# Patient Record
Sex: Male | Born: 1935
Health system: Southern US, Community
[De-identification: ages and names within clinical notes are randomized; demographics above are authoritative.]

## PROBLEM LIST (undated history)

## (undated) DIAGNOSIS — I10 Essential (primary) hypertension: Secondary | ICD-10-CM

## (undated) DIAGNOSIS — N400 Enlarged prostate without lower urinary tract symptoms: Secondary | ICD-10-CM

## (undated) DIAGNOSIS — E7800 Pure hypercholesterolemia, unspecified: Secondary | ICD-10-CM

## (undated) DIAGNOSIS — H269 Unspecified cataract: Secondary | ICD-10-CM

## (undated) HISTORY — PX: ORIF KNEE DISLOCATION: SUR938

## (undated) HISTORY — DX: Unspecified cataract: H26.9

## (undated) HISTORY — PX: EYE SURGERY: SHX253

---

## 1978-06-09 HISTORY — PX: FRACTURE SURGERY: SHX138

## 2005-12-03 ENCOUNTER — Ambulatory Visit (HOSPITAL_COMMUNITY): Admission: RE | Admit: 2005-12-03 | Discharge: 2005-12-03 | Payer: Self-pay | Admitting: Internal Medicine

## 2005-12-03 ENCOUNTER — Ambulatory Visit: Payer: Self-pay | Admitting: Internal Medicine

## 2010-01-24 ENCOUNTER — Ambulatory Visit (HOSPITAL_COMMUNITY): Admission: RE | Admit: 2010-01-24 | Discharge: 2010-01-24 | Payer: Self-pay | Admitting: Ophthalmology

## 2010-08-23 LAB — BASIC METABOLIC PANEL
BUN: 14 mg/dL (ref 6–23)
Calcium: 9.9 mg/dL (ref 8.4–10.5)
Creatinine, Ser: 1.08 mg/dL (ref 0.4–1.5)
GFR calc non Af Amer: >60 mL/min (ref >60)
Glucose, Bld: 185 mg/dL — ABNORMAL HIGH (ref 70–99)

## 2010-10-25 NOTE — Op Note (Signed)
Ethan Davis, Ethan Davis                 ACCOUNT NO.:  [id number]   MEDICAL RECORD NO.:  [id number]          PATIENT TYPE:  AMB   LOCATION:  DAY                           FACILITY:  [facility]   PHYSICIAN:  R. Roetta Sessions, M.D. DATE OF BIRTH:  [birth year]   DATE OF PROCEDURE:  12/03/2005  DATE OF DISCHARGE:                                 OPERATIVE REPORT   PROCEDURE PERFORMED:  Screening colonoscopy.   INDICATIONS FOR PROCEDURE:  The patient is a 77 year old African-American  male sent over courtesy of Dr. Evelene Croon in [place], [place] for  colorectal cancer screening.  Mr. Veith has no lower GI tract symptoms.  He  has never had his lower GI tract evaluated and there is no family history of  colorectal neoplasia.  Colonoscopy is now being done as a screening  maneuver.  This approach has been discussed with the patient at length.  Potential risks, benefits and alternatives have been reviewed, questions  have been answered, he is agreeable.  Please see documentation in the  medical records.   PROCEDURE NOTE:  Oxygen saturations, blood pressure, pulse and respirations  were monitored throughout the entire procedure.   CONSCIOUS SEDATION:  Versed 2 mg IV, Demerol 50 mg IV.   INSTRUMENT USED:  Olympus video chip system.   FINDINGS:  Digital exam revealed no abnormalities.   ENDOSCOPIC FINDINGS:  Prep was adequate.   Rectum:  Examination of the rectal mucosa including retroflex view of the  anal verge revealed no abnormalities aside from some internal hemorrhoids.  Colon:  Colonic mucosa was surveyed from the rectosigmoid junction to the  left, transverse and right colon to the area of the appendiceal orifice and  ileocecal valve and cecum.  These structures were well seen and photographed  for the record.  From this level, the scope was slowly withdrawn.  All  previously mentioned mucosal surfaces were again seen.  The colon was  elongated and tortuous; however, the colonic  mucosa otherwise appeared  normal.  The patient tolerated the procedure well, was reacted in endoscopy.   IMPRESSION:  Minimal internal hemorrhoids, otherwise normal rectum.  Elongated, tortuous but otherwise normal-appearing colon.   RECOMMENDATIONS:  1.  Would not rule out one more colonoscopy in 10 years or longer since      health otherwise is holding up.  2.  He is to follow up with Dr. Lacie Scotts down at [place], [facility] as      scheduled.   I would like to thank Dr. Lacie Scotts for letting us see this nice gentleman  today.      Jonathon Bellows, M.D.  Electronically Signed     RMR/MEDQ  D:  12/03/2005  T:  12/03/2005  Job:  [id number]   cc:   Evelene Croon  Fax: [phone]

## 2011-03-05 ENCOUNTER — Encounter (HOSPITAL_COMMUNITY): Payer: Self-pay

## 2011-03-05 ENCOUNTER — Encounter (HOSPITAL_COMMUNITY)
Admission: RE | Admit: 2011-03-05 | Discharge: 2011-03-05 | Disposition: A | Discharge status: Home / Self Care | Payer: Medicare Other | Source: Ambulatory Visit | Attending: Ophthalmology | Admitting: Ophthalmology

## 2011-03-05 ENCOUNTER — Other Ambulatory Visit: Payer: Self-pay

## 2011-03-05 HISTORY — DX: Benign prostatic hyperplasia without lower urinary tract symptoms: N40.0

## 2011-03-05 HISTORY — DX: Pure hypercholesterolemia, unspecified: E78.00

## 2011-03-05 HISTORY — DX: Essential (primary) hypertension: I10

## 2011-03-05 LAB — BASIC METABOLIC PANEL
CO2: 30 mEq/L (ref 19–32)
Calcium: 9.6 mg/dL (ref 8.4–10.5)
Creatinine, Ser: 0.97 mg/dL (ref 0.50–1.35)
GFR calc Af Amer: >60 mL/min (ref >60)

## 2011-03-05 LAB — CBC
MCH: 31.3 pg (ref 26.0–34.0)
MCHC: 33.1 g/dL (ref 30.0–36.0)
MCV: 94.6 fL (ref 78.0–100.0)
Platelets: 135 10*3/uL — ABNORMAL LOW (ref 150–400)
RDW: 13.5 % (ref 11.5–15.5)

## 2011-03-05 NOTE — Patient Instructions (Addendum)
20 Ethan Davis  03/05/2011   Your procedure is scheduled on:  03/13/2011  Report to [facility] at  730  AM.  Call this number if you have problems the morning of surgery: [phone]   Remember:   Do not eat food:After Midnight.  Do not drink clear liquids: After Midnight.  Take these medicines the morning of surgery with A SIP OF WATER: none   Do not wear jewelry, make-up or nail polish.  Do not wear lotions, powders, or perfumes. You may wear deodorant.  Do not shave 48 hours prior to surgery.  Do not bring valuables to the hospital.  Contacts, dentures or bridgework may not be worn into surgery.  Leave suitcase in the car. After surgery it may be brought to your room.  For patients admitted to the hospital, checkout time is 11:00 AM the day of discharge.   Patients discharged the day of surgery will not be allowed to drive home.  Name and phone number of your driver:  family  Special Instructions: N/A   Please read over the following fact sheets that you were given: Pain Booklet, Surgical Site Infection Prevention, Anesthesia Post-op Instructions and Care and Recovery After Surgery PATIENT INSTRUCTIONS POST-ANESTHESIA  IMMEDIATELY FOLLOWING SURGERY:  Do not drive or operate machinery for the first twenty four hours after surgery.  Do not make any important decisions for twenty four hours after surgery or while taking narcotic pain medications or sedatives.  If you develop intractable nausea and vomiting or a severe headache please notify your doctor immediately.  FOLLOW-UP:  Please make an appointment with your surgeon as instructed. You do not need to follow up with anesthesia unless specifically instructed to do so.  WOUND CARE INSTRUCTIONS (if applicable):  Keep a dry clean dressing on the anesthesia/puncture wound site if there is drainage.  Once the wound has quit draining you may leave it open to air.  Generally you should leave the bandage intact for twenty four hours unless  there is drainage.  If the epidural site drains for more than 36-48 hours please call the anesthesia department.  QUESTIONS?:  Please feel free to call your physician or the hospital operator if you have any questions, and they will be happy to assist you.     [facility] Anesthesia Department [address] [place] [phone]

## 2011-03-13 ENCOUNTER — Encounter (HOSPITAL_COMMUNITY)
Admission: RE | Disposition: A | Discharge status: Home / Self Care | Payer: Self-pay | Source: Ambulatory Visit | Attending: Ophthalmology

## 2011-03-13 ENCOUNTER — Ambulatory Visit (HOSPITAL_COMMUNITY): Payer: Medicare Other | Admitting: Anesthesiology

## 2011-03-13 ENCOUNTER — Encounter (HOSPITAL_COMMUNITY): Payer: Self-pay | Admitting: Ophthalmology

## 2011-03-13 ENCOUNTER — Ambulatory Visit (HOSPITAL_COMMUNITY)
Admission: RE | Admit: 2011-03-13 | Discharge: 2011-03-13 | Disposition: A | Discharge status: Home / Self Care | Payer: Medicare Other | Source: Ambulatory Visit | Attending: Ophthalmology | Admitting: Ophthalmology

## 2011-03-13 ENCOUNTER — Encounter (HOSPITAL_COMMUNITY): Payer: Self-pay | Admitting: Anesthesiology

## 2011-03-13 DIAGNOSIS — Z79899 Other long term (current) drug therapy: Secondary | ICD-10-CM | POA: Insufficient documentation

## 2011-03-13 DIAGNOSIS — Z01810 Encounter for preprocedural cardiovascular examination: Secondary | ICD-10-CM | POA: Insufficient documentation

## 2011-03-13 DIAGNOSIS — Z01812 Encounter for preprocedural laboratory examination: Secondary | ICD-10-CM | POA: Insufficient documentation

## 2011-03-13 DIAGNOSIS — I10 Essential (primary) hypertension: Secondary | ICD-10-CM | POA: Insufficient documentation

## 2011-03-13 DIAGNOSIS — E7800 Pure hypercholesterolemia, unspecified: Secondary | ICD-10-CM | POA: Insufficient documentation

## 2011-03-13 DIAGNOSIS — H2589 Other age-related cataract: Secondary | ICD-10-CM | POA: Insufficient documentation

## 2011-03-13 DIAGNOSIS — E119 Type 2 diabetes mellitus without complications: Secondary | ICD-10-CM | POA: Insufficient documentation

## 2011-03-13 HISTORY — PX: CATARACT EXTRACTION W/PHACO: SHX586

## 2011-03-13 SURGERY — PHACOEMULSIFICATION, CATARACT, WITH IOL INSERTION
Anesthesia: Monitor Anesthesia Care | Site: Eye | Laterality: Right | Wound class: Clean

## 2011-03-13 MED ORDER — BSS IO SOLN
INTRAOCULAR | Status: DC | PRN
  Administered 2011-03-13: 15 mL via OPHTHALMIC

## 2011-03-13 MED ORDER — EPINEPHRINE HCL 1 MG/ML IJ SOLN
INTRAMUSCULAR | Status: AC
  Filled 2011-03-13: qty 1

## 2011-03-13 MED ORDER — PHENYLEPHRINE HCL 2.5 % OP SOLN
OPHTHALMIC | Status: AC
  Administered 2011-03-13: 1 [drp] via OPHTHALMIC
  Filled 2011-03-13: qty 2

## 2011-03-13 MED ORDER — CYCLOPENTOLATE-PHENYLEPHRINE 0.2-1 % OP SOLN
1.0000 [drp] | OPHTHALMIC | Status: AC
  Administered 2011-03-13 (×3): 1 [drp] via OPHTHALMIC

## 2011-03-13 MED ORDER — PROVISC 10 MG/ML IO SOLN
INTRAOCULAR | Status: DC | PRN
  Administered 2011-03-13: .85 mL via OPHTHALMIC

## 2011-03-13 MED ORDER — LIDOCAINE HCL 3.5 % OP GEL
1.0000 "application " | Freq: Once | OPHTHALMIC | Status: AC
  Administered 2011-03-13: 1 via OPHTHALMIC

## 2011-03-13 MED ORDER — LACTATED RINGERS IV SOLN
INTRAVENOUS | Status: DC
  Administered 2011-03-13: 1000 mL via INTRAVENOUS

## 2011-03-13 MED ORDER — LIDOCAINE HCL (PF) 1 % IJ SOLN
INTRAMUSCULAR | Status: DC
Start: 2011-03-13 — End: 2011-03-13
  Filled 2011-03-13: qty 2

## 2011-03-13 MED ORDER — TETRACAINE HCL 0.5 % OP SOLN
OPHTHALMIC | Status: AC
  Administered 2011-03-13: 1 [drp] via OPHTHALMIC
  Filled 2011-03-13: qty 2

## 2011-03-13 MED ORDER — TETRACAINE HCL 0.5 % OP SOLN
1.0000 [drp] | OPHTHALMIC | Status: AC
  Administered 2011-03-13 (×3): 1 [drp] via OPHTHALMIC

## 2011-03-13 MED ORDER — LIDOCAINE 3.5 % OP GEL OPTIME - NO CHARGE
OPHTHALMIC | Status: DC | PRN
  Administered 2011-03-13: 1 [drp] via OPHTHALMIC

## 2011-03-13 MED ORDER — LIDOCAINE HCL (PF) 1 % IJ SOLN
INTRAOCULAR | Status: DC | PRN
  Administered 2011-03-13: 09:00:00 via OPHTHALMIC

## 2011-03-13 MED ORDER — LIDOCAINE HCL 3.5 % OP GEL
OPHTHALMIC | Status: AC
  Administered 2011-03-13: 1 via OPHTHALMIC
  Filled 2011-03-13: qty 5

## 2011-03-13 MED ORDER — POVIDONE-IODINE 5 % OP SOLN
OPHTHALMIC | Status: DC | PRN
  Administered 2011-03-13: 1 via OPHTHALMIC

## 2011-03-13 MED ORDER — PHENYLEPHRINE HCL 2.5 % OP SOLN
1.0000 [drp] | OPHTHALMIC | Status: AC
  Administered 2011-03-13 (×3): 1 [drp] via OPHTHALMIC

## 2011-03-13 MED ORDER — NEOMYCIN-POLYMYXIN-DEXAMETH 3.5-10000-0.1 OP OINT
TOPICAL_OINTMENT | OPHTHALMIC | Status: DC
Start: 2011-03-13 — End: 2011-03-13
  Filled 2011-03-13: qty 3.5

## 2011-03-13 MED ORDER — MIDAZOLAM HCL 2 MG/2ML IJ SOLN
INTRAMUSCULAR | Status: AC
  Administered 2011-03-13: 2 mg via INTRAVENOUS
  Filled 2011-03-13: qty 2

## 2011-03-13 MED ORDER — CYCLOPENTOLATE-PHENYLEPHRINE 0.2-1 % OP SOLN
OPHTHALMIC | Status: AC
  Administered 2011-03-13: 1 [drp] via OPHTHALMIC
  Filled 2011-03-13: qty 2

## 2011-03-13 MED ORDER — NEOMYCIN-POLYMYXIN-DEXAMETH 0.1 % OP OINT
TOPICAL_OINTMENT | OPHTHALMIC | Status: DC | PRN
  Administered 2011-03-13: 1 via OPHTHALMIC

## 2011-03-13 MED ORDER — EPINEPHRINE HCL 1 MG/ML IJ SOLN
INTRAOCULAR | Status: DC | PRN
  Administered 2011-03-13: 09:00:00

## 2011-03-13 MED ORDER — MIDAZOLAM HCL 2 MG/2ML IJ SOLN
1.0000 mg | INTRAMUSCULAR | Status: DC | PRN
  Administered 2011-03-13: 2 mg via INTRAVENOUS

## 2011-03-13 SURGICAL SUPPLY — 33 items
CAPSULAR TENSION RING-AMO (OPHTHALMIC RELATED) IMPLANT
CLOTH BEACON ORANGE TIMEOUT ST (SAFETY) ×1 IMPLANT
DUOVISC SYSTEM (INTRAOCULAR LENS)
EYE SHIELD UNIVERSAL CLEAR (GAUZE/BANDAGES/DRESSINGS) ×1 IMPLANT
GLOVE BIO SURGEON STRL SZ 6.5 (GLOVE) IMPLANT
GLOVE BIOGEL PI IND STRL 6.5 (GLOVE) IMPLANT
GLOVE BIOGEL PI IND STRL 7.0 (GLOVE) IMPLANT
GLOVE BIOGEL PI IND STRL 7.5 (GLOVE) IMPLANT
GLOVE BIOGEL PI INDICATOR 6.5 (GLOVE) ×1
GLOVE BIOGEL PI INDICATOR 7.0 (GLOVE)
GLOVE BIOGEL PI INDICATOR 7.5 (GLOVE)
GLOVE ECLIPSE 6.5 STRL STRAW (GLOVE) IMPLANT
GLOVE ECLIPSE 7.0 STRL STRAW (GLOVE) IMPLANT
GLOVE ECLIPSE 7.5 STRL STRAW (GLOVE) IMPLANT
GLOVE EXAM NITRILE LRG STRL (GLOVE) IMPLANT
GLOVE EXAM NITRILE MD LF STRL (GLOVE) ×1 IMPLANT
GLOVE SKINSENSE NS SZ6.5 (GLOVE)
GLOVE SKINSENSE NS SZ7.0 (GLOVE)
GLOVE SKINSENSE STRL SZ6.5 (GLOVE) IMPLANT
GLOVE SKINSENSE STRL SZ7.0 (GLOVE) IMPLANT
KIT VITRECTOMY (OPHTHALMIC RELATED) IMPLANT
PAD ARMBOARD 7.5X6 YLW CONV (MISCELLANEOUS) ×2 IMPLANT
PROC W NO LENS (INTRAOCULAR LENS)
PROC W SPEC LENS (INTRAOCULAR LENS)
PROCESS W NO LENS (INTRAOCULAR LENS) IMPLANT
PROCESS W SPEC LENS (INTRAOCULAR LENS) IMPLANT
RING MALYGIN (MISCELLANEOUS) IMPLANT
SIGHTPATH CAT PROC W REG LENS (Ophthalmic Related) ×2 IMPLANT
SYR TB 1ML LL NO SAFETY (SYRINGE) ×1 IMPLANT
SYSTEM DUOVISC (INTRAOCULAR LENS) IMPLANT
TAPE CLOTH 1X10 TAN NS (GAUZE/BANDAGES/DRESSINGS) ×1 IMPLANT
VISCOELASTIC ADDITIONAL (OPHTHALMIC RELATED) IMPLANT
WATER STERILE IRR 250ML POUR (IV SOLUTION) ×1 IMPLANT

## 2011-03-13 NOTE — Transfer of Care (Signed)
Immediate Anesthesia Transfer of Care Note  Patient: Ethan Davis  Procedure(s) Performed:  CATARACT EXTRACTION PHACO AND INTRAOCULAR LENS PLACEMENT (IOC) - CDE: 14.58  Patient Location: short stay Anesthesia Type: MAC  Level of Consciousness: awake, alert  and oriented  Airway & Oxygen Therapy: Patient Spontanous Breathing  Post-op Assessment: Report given to PACU RN  Post vital signs: Reviewed and stable  Complications: No apparent anesthesia complications

## 2011-03-13 NOTE — Brief Op Note (Signed)
Pre-Op Dx: Cataract OD Post-Op Dx: Cataract OD Surgeon: Lasharon Dunivan Anesthesia: Topical with MAC Implant: Lenstec, Model Softec HD Blood Loss: None Specimen: None Complications: None 

## 2011-03-13 NOTE — Op Note (Signed)
NAMEAUDY, DAUPHINE NO.:  [id number]  MEDICAL RECORD NO.:  [id number]  LOCATION:  [facility]                          FACILITY:  [facility]  PHYSICIAN:  Susanne Greenhouse, MD       DATE OF BIRTH:  26-Apr-1927  DATE OF PROCEDURE:  03/13/2011 DATE OF DISCHARGE:  03/13/2011                              OPERATIVE REPORT   PREOPERATIVE DIAGNOSIS:  Combined cataract, right eye, diagnosis code 366.19  POSTOPERATIVE DIAGNOSIS:  Combined cataract, right eye, diagnosis code 366.19.  PROCEDURE PERFORMED:  Phacoemulsification with intraocular lens implantation, left eye.  SURGEON:  Susanne Greenhouse, MD  DESCRIPTION OF OPERATION:  In the preoperative holding area, dilating drops and viscous lidocaine were placed into the left eye.  The patient was then brought to the operating room where he was prepped and draped. Beginning with a 75-blade, a paracentesis port was made at the surgeon's 2 o'clock position.  The anterior chamber was then filled with a 1% nonpreserved lidocaine solution.  This was followed by filling the anterior chamber with Provisc.  The 2.4-mm keratome blade was then used to make a clear corneal incision at the temporal limbus.  A bent cystotome needle and Utrata forceps were used to create a continuous tear capsulotomy.  Hydrodissection was performed with balanced salt solution on a fine cannula.  The lens nucleus was then removed using phacoemulsification in a quadrant cracking technique.  Residual cortex was removed with irrigation and aspiration.  The capsular bag and anterior chamber were then refilled with Provisc and a posterior chamber intraocular lens was placed into the capsular bag without difficulty using a lens injecting system.  The Provisc was removed from the capsular bag and anterior chamber with irrigation and aspiration. Stromal hydration of the main incision and paracentesis ports was performed with balanced salt solution on a fine cannula.  The  wounds were tested for leak, which were negative.  The patient tolerated the procedure well.  There were no operative complications and he was returned to the recovery area in satisfactory condition.  No surgical specimens. Prosthetic device used is a Lenstec posterior chamber lens, model Softec HD, power of 18.5, serial number is [id number].          ______________________________ Susanne Greenhouse, MD     KEH/MEDQ  D:  03/13/2011  T:  03/13/2011  Job:  [id number]

## 2011-03-13 NOTE — Anesthesia Preprocedure Evaluation (Addendum)
Anesthesia Evaluation   Patient awake  General Assessment Comment  Reviewed: Allergy & Precautions, H&P , NPO status , Patient's Chart, lab work & pertinent test results  History of Anesthesia Complications Negative for: history of anesthetic complications  Airway Mallampati: I  Neck ROM: Full    Dental  (+) Missing, Chipped and Poor Dentition   Pulmonary    Pulmonary exam normal       Cardiovascular hypertension, Pt. on medications Regular Normal    Neuro/Psych    GI/Hepatic   Endo/Other  Diabetes mellitus-, Well Controlled, Type 2, Oral Hypoglycemic Agents  Renal/GU    BPH    Musculoskeletal   Abdominal   Peds  Hematology   Anesthesia Other Findings   Reproductive/Obstetrics                          Anesthesia Physical Anesthesia Plan  ASA: III  Anesthesia Plan: MAC   Post-op Pain Management:    Induction:   Airway Management Planned: Nasal Cannula  Additional Equipment:   Intra-op Plan:   Post-operative Plan:   Informed Consent: I have reviewed the patients History and Physical, chart, labs and discussed the procedure including the risks, benefits and alternatives for the proposed anesthesia with the patient or authorized representative who has indicated his/her understanding and acceptance.     Plan Discussed with:   Anesthesia Plan Comments:         Anesthesia Quick Evaluation

## 2011-03-13 NOTE — Anesthesia Postprocedure Evaluation (Signed)
  Anesthesia Post-op Note  Patient: Ethan Davis  Procedure(s) Performed:  CATARACT EXTRACTION PHACO AND INTRAOCULAR LENS PLACEMENT (IOC) - CDE: 14.58  Patient Location: PACU and Short Stay  Anesthesia Type: MAC  Level of Consciousness: awake, alert  and oriented  Airway and Oxygen Therapy: Patient Spontanous Breathing  Post-op Pain: none  Post-op Assessment: Post-op Vital signs reviewed, Patient's Cardiovascular Status Stable, Respiratory Function Stable and No signs of Nausea or vomiting  Post-op Vital Signs: Reviewed and stable  Complications: No apparent anesthesia complications

## 2011-03-13 NOTE — H&P (Signed)
I have reviewed the H&P, the patient was re-examined, and I have identified no interval changes in medical condition and plan of care since the history and physical of record  

## 2011-03-19 ENCOUNTER — Encounter (HOSPITAL_COMMUNITY): Payer: Self-pay | Admitting: Ophthalmology

## 2011-06-12 DIAGNOSIS — E7800 Pure hypercholesterolemia, unspecified: Secondary | ICD-10-CM | POA: Diagnosis not present

## 2011-06-12 DIAGNOSIS — R5381 Other malaise: Secondary | ICD-10-CM | POA: Diagnosis not present

## 2011-06-12 DIAGNOSIS — E119 Type 2 diabetes mellitus without complications: Secondary | ICD-10-CM | POA: Diagnosis not present

## 2011-06-12 DIAGNOSIS — E559 Vitamin D deficiency, unspecified: Secondary | ICD-10-CM | POA: Diagnosis not present

## 2011-06-12 DIAGNOSIS — I10 Essential (primary) hypertension: Secondary | ICD-10-CM | POA: Diagnosis not present

## 2011-06-12 DIAGNOSIS — N320 Bladder-neck obstruction: Secondary | ICD-10-CM | POA: Diagnosis not present

## 2011-09-12 DIAGNOSIS — E7800 Pure hypercholesterolemia, unspecified: Secondary | ICD-10-CM | POA: Diagnosis not present

## 2011-09-12 DIAGNOSIS — I10 Essential (primary) hypertension: Secondary | ICD-10-CM | POA: Diagnosis not present

## 2011-09-12 DIAGNOSIS — R5381 Other malaise: Secondary | ICD-10-CM | POA: Diagnosis not present

## 2011-09-12 DIAGNOSIS — M129 Arthropathy, unspecified: Secondary | ICD-10-CM | POA: Diagnosis not present

## 2011-09-12 DIAGNOSIS — E119 Type 2 diabetes mellitus without complications: Secondary | ICD-10-CM | POA: Diagnosis not present

## 2011-12-31 DIAGNOSIS — R5381 Other malaise: Secondary | ICD-10-CM | POA: Diagnosis not present

## 2011-12-31 DIAGNOSIS — M129 Arthropathy, unspecified: Secondary | ICD-10-CM | POA: Diagnosis not present

## 2011-12-31 DIAGNOSIS — R5383 Other fatigue: Secondary | ICD-10-CM | POA: Diagnosis not present

## 2011-12-31 DIAGNOSIS — E119 Type 2 diabetes mellitus without complications: Secondary | ICD-10-CM | POA: Diagnosis not present

## 2011-12-31 DIAGNOSIS — E7800 Pure hypercholesterolemia, unspecified: Secondary | ICD-10-CM | POA: Diagnosis not present

## 2011-12-31 DIAGNOSIS — I10 Essential (primary) hypertension: Secondary | ICD-10-CM | POA: Diagnosis not present

## 2011-12-31 DIAGNOSIS — N329 Bladder disorder, unspecified: Secondary | ICD-10-CM | POA: Diagnosis not present

## 2011-12-31 DIAGNOSIS — N320 Bladder-neck obstruction: Secondary | ICD-10-CM | POA: Diagnosis not present

## 2012-06-08 DIAGNOSIS — E119 Type 2 diabetes mellitus without complications: Secondary | ICD-10-CM | POA: Diagnosis not present

## 2012-06-08 DIAGNOSIS — R5381 Other malaise: Secondary | ICD-10-CM | POA: Diagnosis not present

## 2012-06-08 DIAGNOSIS — I10 Essential (primary) hypertension: Secondary | ICD-10-CM | POA: Diagnosis not present

## 2012-06-08 DIAGNOSIS — E7800 Pure hypercholesterolemia, unspecified: Secondary | ICD-10-CM | POA: Diagnosis not present

## 2012-06-08 DIAGNOSIS — E559 Vitamin D deficiency, unspecified: Secondary | ICD-10-CM | POA: Diagnosis not present

## 2012-06-08 DIAGNOSIS — N320 Bladder-neck obstruction: Secondary | ICD-10-CM | POA: Diagnosis not present

## 2012-06-08 DIAGNOSIS — R5383 Other fatigue: Secondary | ICD-10-CM | POA: Diagnosis not present

## 2012-06-22 DIAGNOSIS — R5383 Other fatigue: Secondary | ICD-10-CM | POA: Diagnosis not present

## 2012-11-14 ENCOUNTER — Encounter (HOSPITAL_COMMUNITY): Payer: Self-pay

## 2012-11-14 ENCOUNTER — Emergency Department (INDEPENDENT_AMBULATORY_CARE_PROVIDER_SITE_OTHER)
Admission: EM | Admit: 2012-11-14 | Discharge: 2012-11-14 | Disposition: A | Discharge status: Home / Self Care | Payer: Medicare Other | Source: Home / Self Care | Attending: Family Medicine | Admitting: Family Medicine

## 2012-11-14 DIAGNOSIS — L089 Local infection of the skin and subcutaneous tissue, unspecified: Secondary | ICD-10-CM

## 2012-11-14 DIAGNOSIS — L723 Sebaceous cyst: Secondary | ICD-10-CM | POA: Diagnosis not present

## 2012-11-14 MED ORDER — DOXYCYCLINE HYCLATE 100 MG PO CAPS: 100.0000 mg | ORAL_CAPSULE | Freq: Two times a day (BID) | ORAL | Status: DC

## 2012-11-14 MED ORDER — ACETAMINOPHEN 650 MG PO TABS: 1.0000 | ORAL_TABLET | Freq: Three times a day (TID) | ORAL | Status: AC | PRN

## 2012-11-14 NOTE — ED Notes (Signed)
Reports cyst on upper back x 10 yr +, has started to bleed and drain (none at present, area surrounding lesion hard, reddened)

## 2012-11-15 ENCOUNTER — Encounter (HOSPITAL_COMMUNITY): Payer: Self-pay

## 2012-11-15 ENCOUNTER — Emergency Department (HOSPITAL_COMMUNITY)
Admission: EM | Admit: 2012-11-15 | Discharge: 2012-11-15 | Disposition: A | Discharge status: Home / Self Care | Payer: Medicare Other | Attending: Emergency Medicine | Admitting: Emergency Medicine

## 2012-11-15 DIAGNOSIS — E119 Type 2 diabetes mellitus without complications: Secondary | ICD-10-CM | POA: Diagnosis not present

## 2012-11-15 DIAGNOSIS — Z5189 Encounter for other specified aftercare: Secondary | ICD-10-CM

## 2012-11-15 DIAGNOSIS — E7800 Pure hypercholesterolemia, unspecified: Secondary | ICD-10-CM | POA: Insufficient documentation

## 2012-11-15 DIAGNOSIS — N400 Enlarged prostate without lower urinary tract symptoms: Secondary | ICD-10-CM | POA: Diagnosis not present

## 2012-11-15 DIAGNOSIS — I10 Essential (primary) hypertension: Secondary | ICD-10-CM | POA: Diagnosis not present

## 2012-11-15 DIAGNOSIS — Z4801 Encounter for change or removal of surgical wound dressing: Secondary | ICD-10-CM | POA: Insufficient documentation

## 2012-11-15 DIAGNOSIS — Z87891 Personal history of nicotine dependence: Secondary | ICD-10-CM | POA: Diagnosis not present

## 2012-11-15 DIAGNOSIS — Z79899 Other long term (current) drug therapy: Secondary | ICD-10-CM | POA: Insufficient documentation

## 2012-11-15 NOTE — ED Provider Notes (Signed)
History     CSN: [id number]  Arrival date & time 11/15/12  1007   First MD Initiated Contact with Patient 11/15/12 1148      Chief Complaint  Patient presents with  . packing removal     (Consider location/radiation/quality/duration/timing/severity/associated sxs/prior treatment) HPI Comments: Ethan Davis is a 79 y.o. male who presents to the Emergency Department requesting recheck and packing removal from a abscess that was I&D'd on the day prior at [facility] urgent care.  Patient reports pain is improving and he is taking the antibiotict as directed.  He denies fever, chills, abd pain or vomiting  The history is provided by the patient.    Past Medical History  Diagnosis Date  . Hypertension   . Hypercholesteremia   . BPH (benign prostatic hyperplasia)   . Diabetes mellitus     Past Surgical History  Procedure Laterality Date  . Eye surgery      left cataract extraction  . Orif knee dislocation  as child    right   . Cataract extraction w/phaco  03/13/2011    Procedure: CATARACT EXTRACTION PHACO AND INTRAOCULAR LENS PLACEMENT (IOC);  Surgeon: Gemma Payor;  Location: [facility];  Service: Ophthalmology;  Laterality: Right;  CDE: 14.58    Family History  Problem Relation Age of Onset  . Anesthesia problems Neg Hx   . Hypotension Neg Hx   . Malignant hyperthermia Neg Hx   . Pseudochol deficiency Neg Hx     History  Substance Use Topics  . Smoking status: Former Smoker -- 0.25 packs/day for 20 years    Types: Cigarettes    Quit date: 03/04/1964  . Smokeless tobacco: Not on file  . Alcohol Use: 0.0 oz/week     Comment: very little      Review of Systems  Constitutional: Negative for fever and chills.  Gastrointestinal: Negative for nausea and vomiting.  Musculoskeletal: Negative for joint swelling and arthralgias.  Skin: Positive for color change.       Abscess   Hematological: Negative for adenopathy.  All other systems reviewed and are negative.    Allergies   Review of patient's allergies indicates no known allergies.  Home Medications   Current Outpatient Rx  Name  Route  Sig  Dispense  Refill  . Acetaminophen 650 MG TABS   Oral   Take 1 tablet (650 mg total) by mouth 3 (three) times daily as needed.   30 tablet   0   . cholecalciferol (VITAMIN D) 1000 UNITS tablet   Oral   Take 1,000 Units by mouth daily.           Marland Kitchen doxazosin (CARDURA) 4 MG tablet   Oral   Take 4 mg by mouth at bedtime.           Marland Kitchen doxycycline (VIBRAMYCIN) 100 MG capsule   Oral   Take 1 capsule (100 mg total) by mouth 2 (two) times daily.   20 capsule   0   . finasteride (PROSCAR) 5 MG tablet   Oral   Take 5 mg by mouth daily.           Marland Kitchen glyBURIDE-metformin (GLUCOVANCE) 2.5-500 MG per tablet   Oral   Take 1 tablet by mouth 2 (two) times daily with a meal.          . lisinopril-hydrochlorothiazide (PRINZIDE,ZESTORETIC) 10-12.5 MG per tablet   Oral   Take 1 tablet by mouth daily.           Marland Kitchen  meloxicam (MOBIC) 7.5 MG tablet   Oral   Take 7.5 mg by mouth 2 (two) times daily.           . simvastatin (ZOCOR) 20 MG tablet   Oral   Take 20 mg by mouth at bedtime.             BP 127/74  Pulse 89  Temp(Src) 97 F (36.1 C) (Oral)  Resp 20  Ht 6' (1.829 m)  Wt 180 lb (81.647 kg)  BMI 24.41 kg/m2  SpO2 99%  Physical Exam  Nursing note and vitals reviewed. Constitutional: He is oriented to person, place, and time. He appears well-developed and well-nourished. No distress.  HENT:  Head: Normocephalic and atraumatic.  Cardiovascular: Normal rate, regular rhythm, normal heart sounds and intact distal pulses.   No murmur heard. Pulmonary/Chest: Effort normal and breath sounds normal. No respiratory distress.  Musculoskeletal: Normal range of motion. He exhibits no edema.  Neurological: He is alert and oriented to person, place, and time. He exhibits normal muscle tone. Coordination normal.  Skin: Skin is warm. There is erythema.   Abscess to the mid upper back with previous I&D performed and packing in place.  Area appears to be improving.  No significant surrounding erythema.  No lymphangitis    ED Course  Procedures (including critical care time)  Labs Reviewed - No data to display No results found.      MDM  Previous urgent care chart reviewed by me  Patient has a abscess to the middle upper back with previous incision and drainage. Appears to be healing well.  Packing was removed successfully by me. Patient currently taking doxycycline. He is well appearing. He agrees to continue the doxycycline, warm compresses and to return here if symptoms worsen      Alhassan Everingham L. Trisha Mangle, PA-C 11/16/12 2152

## 2012-11-15 NOTE — ED Notes (Signed)
Pt had I and D of area on upper back at Urgent Care in [place]. Here to have packing removed..  Dressing has small amt dried blood on it.

## 2012-11-15 NOTE — ED Provider Notes (Signed)
History     CSN: [id number]  Arrival date & time 11/14/12  1455   First MD Initiated Contact with Patient 11/14/12 1549      Chief Complaint  Patient presents with  . Cyst    (Consider location/radiation/quality/duration/timing/severity/associated sxs/prior treatment) HPI Comments: 79 year old diabetic male here complaining of cyst in his upper back for over 10 years. Area became tender and bleeding in the last few days. Not draining today but still tender. Daughter decided to bring him here for evaluation. No fever or chills.   Past Medical History  Diagnosis Date  . Hypertension   . Hypercholesteremia   . BPH (benign prostatic hyperplasia)   . Diabetes mellitus     Past Surgical History  Procedure Laterality Date  . Eye surgery      left cataract extraction  . Orif knee dislocation  as child    right   . Cataract extraction w/phaco  03/13/2011    Procedure: CATARACT EXTRACTION PHACO AND INTRAOCULAR LENS PLACEMENT (IOC);  Surgeon: Gemma Payor;  Location: [facility];  Service: Ophthalmology;  Laterality: Right;  CDE: 14.58    Family History  Problem Relation Age of Onset  . Anesthesia problems Neg Hx   . Hypotension Neg Hx   . Malignant hyperthermia Neg Hx   . Pseudochol deficiency Neg Hx     History  Substance Use Topics  . Smoking status: Former Smoker -- 0.25 packs/day for 20 years    Types: Cigarettes    Quit date: 03/04/1964  . Smokeless tobacco: Not on file  . Alcohol Use: 0.0 oz/week     Comment: very little      Review of Systems  Constitutional: Negative for fever, chills, diaphoresis and appetite change.  Gastrointestinal: Negative for nausea.  Skin:       As per history of present illness  Neurological: Negative for dizziness and headaches.  All other systems reviewed and are negative.    Allergies  Review of patient's allergies indicates no known allergies.  Home Medications   Current Outpatient Rx  Name  Route  Sig  Dispense  Refill  .  cholecalciferol (VITAMIN D) 1000 UNITS tablet   Oral   Take 1,000 Units by mouth daily.           . finasteride (PROSCAR) 5 MG tablet   Oral   Take 5 mg by mouth daily.           Marland Kitchen glyBURIDE-metformin (GLUCOVANCE) 2.5-500 MG per tablet   Oral   Take 1 tablet by mouth 2 (two) times daily with a meal.          . lisinopril-hydrochlorothiazide (PRINZIDE,ZESTORETIC) 10-12.5 MG per tablet   Oral   Take 1 tablet by mouth daily.           . meloxicam (MOBIC) 7.5 MG tablet   Oral   Take 7.5 mg by mouth 2 (two) times daily.           . Acetaminophen 650 MG TABS   Oral   Take 1 tablet (650 mg total) by mouth 3 (three) times daily as needed.   30 tablet   0   . doxazosin (CARDURA) 4 MG tablet   Oral   Take 4 mg by mouth at bedtime.           Marland Kitchen doxycycline (VIBRAMYCIN) 100 MG capsule   Oral   Take 1 capsule (100 mg total) by mouth 2 (two) times daily.   20 capsule  0   . simvastatin (ZOCOR) 20 MG tablet   Oral   Take 20 mg by mouth at bedtime.             BP 129/71  Pulse 86  Temp(Src) 97.8 F (36.6 C) (Oral)  Resp 16  SpO2 96%  Physical Exam  Nursing note and vitals reviewed. Constitutional: He is oriented to person, place, and time. He appears well-developed and well-nourished. No distress.  HENT:  Hard of hearing (not new)  Eyes: No scleral icterus.  Neck: Neck supple.  Cardiovascular: Normal rate and regular rhythm.   Pulmonary/Chest: Breath sounds normal.  Lymphadenopathy:    He has no cervical adenopathy.  Neurological: He is alert and oriented to person, place, and time.  Skin: He is not diaphoretic.  There is a sebaceous cyst about 3x3 cm in the middle of upper back. There is a scab on top and small area of fluctuation. There is associated erythema and tenderness to palpation. No significant associated cellulitis.    ED Course  INCISION AND DRAINAGE Performed by: Sharin Grave Authorized by: Sharin Grave Consent: Verbal  consent obtained. Risks and benefits: risks, benefits and alternatives were discussed Consent given by: patient and spouse (and daughter) Patient understanding: patient states understanding of the procedure being performed Patient consent: the patient's understanding of the procedure matches consent given Type: abscess (infected sebaceus cyst) Body area: trunk (mid upper back) Location details: back Anesthesia: local infiltration Local anesthetic: lidocaine 2% with epinephrine Anesthetic total: 2 ml Scalpel size: 11 Incision type: single straight Complexity: simple Drainage: serous and purulent (dark wax like exudate extracted) Drainage amount: moderate Packing material: 1/2 in iodoform gauze Patient tolerance: Patient tolerated the procedure well with no immediate complications. Comments: Antibiotic ointment and dry dressing applied on top of wound after I&D   (including critical care time)  Labs Reviewed  CULTURE, ROUTINE-ABSCESS   No results found.   1. Infected sebaceous cyst of skin       MDM  Performed I&D today. Prescribed doxycycline. Supportive care including wound care instructions and red flags that should prompt return to medical attention discussed with patient and his family and provided in writing. Asked to return tomorrow for packing removal and wound check.         Sharin Grave, MD 11/15/12 1530

## 2012-11-15 NOTE — ED Notes (Signed)
Pt reports went to Urgent care yesterday and had I and D of cyst on back.  Reports was told to have packing removed this morning.  Pt says is taking his antibiotic.

## 2012-11-17 DIAGNOSIS — IMO0002 Reserved for concepts with insufficient information to code with codable children: Secondary | ICD-10-CM | POA: Diagnosis not present

## 2012-11-17 DIAGNOSIS — N320 Bladder-neck obstruction: Secondary | ICD-10-CM | POA: Diagnosis not present

## 2012-11-17 DIAGNOSIS — L0291 Cutaneous abscess, unspecified: Secondary | ICD-10-CM | POA: Diagnosis not present

## 2012-11-17 DIAGNOSIS — M129 Arthropathy, unspecified: Secondary | ICD-10-CM | POA: Diagnosis not present

## 2012-11-17 DIAGNOSIS — J449 Chronic obstructive pulmonary disease, unspecified: Secondary | ICD-10-CM | POA: Diagnosis not present

## 2012-11-17 DIAGNOSIS — I10 Essential (primary) hypertension: Secondary | ICD-10-CM | POA: Diagnosis not present

## 2012-11-17 DIAGNOSIS — E119 Type 2 diabetes mellitus without complications: Secondary | ICD-10-CM | POA: Diagnosis not present

## 2012-11-17 DIAGNOSIS — E7800 Pure hypercholesterolemia, unspecified: Secondary | ICD-10-CM | POA: Diagnosis not present

## 2012-11-17 DIAGNOSIS — R5381 Other malaise: Secondary | ICD-10-CM | POA: Diagnosis not present

## 2012-11-17 DIAGNOSIS — L0390 Cellulitis, unspecified: Secondary | ICD-10-CM | POA: Diagnosis not present

## 2012-11-17 LAB — CULTURE, ROUTINE-ABSCESS

## 2012-11-17 NOTE — ED Provider Notes (Signed)
Medical screening examination/treatment/procedure(s) were performed by non-physician practitioner and as supervising physician I was immediately available for consultation/collaboration.   Glynn Octave, MD 11/17/12 [phone]

## 2012-11-20 ENCOUNTER — Encounter (HOSPITAL_COMMUNITY): Payer: Self-pay | Admitting: *Deleted

## 2012-11-20 ENCOUNTER — Emergency Department (HOSPITAL_COMMUNITY)
Admission: EM | Admit: 2012-11-20 | Discharge: 2012-11-20 | Disposition: A | Discharge status: Home / Self Care | Payer: Medicare Other | Attending: Emergency Medicine | Admitting: Emergency Medicine

## 2012-11-20 DIAGNOSIS — N400 Enlarged prostate without lower urinary tract symptoms: Secondary | ICD-10-CM | POA: Diagnosis not present

## 2012-11-20 DIAGNOSIS — E7800 Pure hypercholesterolemia, unspecified: Secondary | ICD-10-CM | POA: Diagnosis not present

## 2012-11-20 DIAGNOSIS — Z87891 Personal history of nicotine dependence: Secondary | ICD-10-CM | POA: Insufficient documentation

## 2012-11-20 DIAGNOSIS — Z79899 Other long term (current) drug therapy: Secondary | ICD-10-CM | POA: Insufficient documentation

## 2012-11-20 DIAGNOSIS — Z5189 Encounter for other specified aftercare: Secondary | ICD-10-CM

## 2012-11-20 DIAGNOSIS — Z4801 Encounter for change or removal of surgical wound dressing: Secondary | ICD-10-CM | POA: Diagnosis not present

## 2012-11-20 DIAGNOSIS — I10 Essential (primary) hypertension: Secondary | ICD-10-CM | POA: Diagnosis not present

## 2012-11-20 DIAGNOSIS — E119 Type 2 diabetes mellitus without complications: Secondary | ICD-10-CM | POA: Diagnosis not present

## 2012-11-20 NOTE — ED Provider Notes (Signed)
History     CSN: [id number]  Arrival date & time 11/20/12  1010   First MD Initiated Contact with Patient 11/20/12 1014      Chief Complaint  Patient presents with  . Wound Check    (Consider location/radiation/quality/duration/timing/severity/associated sxs/prior treatment) HPI Ethan Davis is a 79 y.o. male who presents to the ED for recheck of of sebaceous cyst where he had I&D 11/14/12. He had the area rechecked 6/9 and today complains of itching in the area and request recheck. The history was provided by the patient.  Past Medical History  Diagnosis Date  . Hypertension   . Hypercholesteremia   . BPH (benign prostatic hyperplasia)   . Diabetes mellitus     Past Surgical History  Procedure Laterality Date  . Eye surgery      left cataract extraction  . Orif knee dislocation  as child    right   . Cataract extraction w/phaco  03/13/2011    Procedure: CATARACT EXTRACTION PHACO AND INTRAOCULAR LENS PLACEMENT (IOC);  Surgeon: Gemma Payor;  Location: [facility];  Service: Ophthalmology;  Laterality: Right;  CDE: 14.58    Family History  Problem Relation Age of Onset  . Anesthesia problems Neg Hx   . Hypotension Neg Hx   . Malignant hyperthermia Neg Hx   . Pseudochol deficiency Neg Hx     History  Substance Use Topics  . Smoking status: Former Smoker -- 0.25 packs/day for 20 years    Types: Cigarettes    Quit date: 03/04/1964  . Smokeless tobacco: Not on file  . Alcohol Use: 0.0 oz/week     Comment: very little      Review of Systems  Constitutional: Negative for fever and chills.  HENT: Negative for neck pain.   Gastrointestinal: Negative for nausea and vomiting.  Skin: Positive for wound.  Psychiatric/Behavioral: The patient is not nervous/anxious.     Allergies  Review of patient's allergies indicates no known allergies.  Home Medications   Current Outpatient Rx  Name  Route  Sig  Dispense  Refill  . Acetaminophen 650 MG TABS   Oral   Take 1 tablet  (650 mg total) by mouth 3 (three) times daily as needed.   30 tablet   0   . cholecalciferol (VITAMIN D) 1000 UNITS tablet   Oral   Take 1,000 Units by mouth daily.           Marland Kitchen doxazosin (CARDURA) 4 MG tablet   Oral   Take 4 mg by mouth at bedtime.           Marland Kitchen doxycycline (VIBRAMYCIN) 100 MG capsule   Oral   Take 1 capsule (100 mg total) by mouth 2 (two) times daily.   20 capsule   0   . finasteride (PROSCAR) 5 MG tablet   Oral   Take 5 mg by mouth daily.           Marland Kitchen glyBURIDE-metformin (GLUCOVANCE) 2.5-500 MG per tablet   Oral   Take 1 tablet by mouth 2 (two) times daily with a meal.          . lisinopril-hydrochlorothiazide (PRINZIDE,ZESTORETIC) 10-12.5 MG per tablet   Oral   Take 1 tablet by mouth daily.           . meloxicam (MOBIC) 7.5 MG tablet   Oral   Take 7.5 mg by mouth 2 (two) times daily.           . simvastatin (ZOCOR)  20 MG tablet   Oral   Take 20 mg by mouth at bedtime.             BP 128/75  Pulse 82  Temp(Src) 98.1 F (36.7 C)  Resp 20  Ht 6' (1.829 m)  Wt 179 lb (81.194 kg)  BMI 24.27 kg/m2  SpO2 96%  Physical Exam  Nursing note and vitals reviewed. Constitutional: He is oriented to person, place, and time. He appears well-developed and well-nourished. No distress.  HENT:  Head: Normocephalic.  Eyes: EOM are normal.  Neck: Neck supple.  Cardiovascular: Normal rate.   Pulmonary/Chest: Effort normal.  Musculoskeletal: Normal range of motion.  Neurological: He is alert and oriented to person, place, and time. No cranial nerve deficit.  Skin:  Healing wound mid upper back. Small area of erythema.   Psychiatric: He has a normal mood and affect.    ED Course  Procedures (including critical care time)  MDM  79 y.o. male here for recheck of I&D area on his back. Area healing well. Discussed with patient finish antibiotics. He has an appointment with Dr. Malvin Johns for follow up in 3 days. He will return here for any problems.   Discussed plan of care with the patient and his daughter andall questioned fully answered.   Medication List    ASK your doctor about these medications       Acetaminophen 650 MG Tabs  Take 1 tablet (650 mg total) by mouth 3 (three) times daily as needed.     cholecalciferol 1000 UNITS tablet  Commonly known as:  VITAMIN D  Take 1,000 Units by mouth daily.     doxazosin 4 MG tablet  Commonly known as:  CARDURA  Take 4 mg by mouth at bedtime.     doxycycline 100 MG capsule  Commonly known as:  VIBRAMYCIN  Take 1 capsule (100 mg total) by mouth 2 (two) times daily.     finasteride 5 MG tablet  Commonly known as:  PROSCAR  Take 5 mg by mouth daily.     glyBURIDE-metformin 2.5-500 MG per tablet  Commonly known as:  GLUCOVANCE  Take 1 tablet by mouth 2 (two) times daily with a meal.     lisinopril-hydrochlorothiazide 10-12.5 MG per tablet  Commonly known as:  PRINZIDE,ZESTORETIC  Take 1 tablet by mouth daily.     meloxicam 7.5 MG tablet  Commonly known as:  MOBIC  Take 7.5 mg by mouth 2 (two) times daily.     simvastatin 20 MG tablet  Commonly known as:  ZOCOR  Take 20 mg by mouth at bedtime.              [facility] Orlene Och, [place] 11/20/12 1124

## 2012-11-20 NOTE — ED Notes (Addendum)
Pt had a sebaceous cyst removed on June 8th, 2014. Pt came here on the 9th of June to have packing removed. Pt now c/o itching to area. Wants this area checked.

## 2012-11-20 NOTE — ED Notes (Signed)
nad noted prior to dc. Dc instructions reviewed and explained. F/u appt discussed and voiced understanding.

## 2012-11-21 NOTE — ED Provider Notes (Signed)
Medical screening examination/treatment/procedure(s) were performed by non-physician practitioner and as supervising physician I was immediately available for consultation/collaboration.   Joya Gaskins, MD 11/21/12 [phone]

## 2012-11-23 DIAGNOSIS — L989 Disorder of the skin and subcutaneous tissue, unspecified: Secondary | ICD-10-CM | POA: Diagnosis not present

## 2012-11-23 DIAGNOSIS — L089 Local infection of the skin and subcutaneous tissue, unspecified: Secondary | ICD-10-CM | POA: Diagnosis not present

## 2012-11-23 DIAGNOSIS — L723 Sebaceous cyst: Secondary | ICD-10-CM | POA: Diagnosis not present

## 2012-11-23 DIAGNOSIS — L98499 Non-pressure chronic ulcer of skin of other sites with unspecified severity: Secondary | ICD-10-CM | POA: Diagnosis not present

## 2013-03-01 ENCOUNTER — Encounter: Payer: Self-pay | Admitting: Family Medicine

## 2013-03-01 ENCOUNTER — Ambulatory Visit (INDEPENDENT_AMBULATORY_CARE_PROVIDER_SITE_OTHER): Payer: Medicare Other | Admitting: Family Medicine

## 2013-03-01 VITALS — BP 110/70 | HR 98 | Temp 97.3°F | Resp 22 | Ht 71.0 in | Wt 168.0 lb

## 2013-03-01 DIAGNOSIS — I10 Essential (primary) hypertension: Secondary | ICD-10-CM | POA: Diagnosis not present

## 2013-03-01 DIAGNOSIS — E119 Type 2 diabetes mellitus without complications: Secondary | ICD-10-CM

## 2013-03-01 DIAGNOSIS — IMO0001 Reserved for inherently not codable concepts without codable children: Secondary | ICD-10-CM | POA: Diagnosis not present

## 2013-03-01 DIAGNOSIS — E785 Hyperlipidemia, unspecified: Secondary | ICD-10-CM | POA: Diagnosis not present

## 2013-03-01 DIAGNOSIS — N400 Enlarged prostate without lower urinary tract symptoms: Secondary | ICD-10-CM

## 2013-03-01 DIAGNOSIS — M1990 Unspecified osteoarthritis, unspecified site: Secondary | ICD-10-CM

## 2013-03-01 LAB — LIPID PANEL
HDL: 74 mg/dL (ref >39)
LDL Cholesterol: 86 mg/dL (ref 0–99)
Triglycerides: 78 mg/dL (ref <150)
VLDL: 16 mg/dL (ref 0–40)

## 2013-03-01 LAB — CBC WITH DIFFERENTIAL/PLATELET
Basophils Relative: 0 % (ref 0–1)
HCT: 40.6 % (ref 39.0–52.0)
Hemoglobin: 13.7 g/dL (ref 13.0–17.0)
Lymphocytes Relative: 33 % (ref 12–46)
MCHC: 33.7 g/dL (ref 30.0–36.0)
MCV: 90.6 fL (ref 78.0–100.0)
Monocytes Absolute: 0.7 10*3/uL (ref 0.1–1.0)
Monocytes Relative: 11 % (ref 3–12)
Neutro Abs: 3.8 10*3/uL (ref 1.7–7.7)

## 2013-03-01 LAB — COMPREHENSIVE METABOLIC PANEL
AST: 15 U/L (ref 0–37)
BUN: 16 mg/dL (ref 6–23)
Calcium: 9.7 mg/dL (ref 8.4–10.5)
Chloride: 100 mEq/L (ref 96–112)
Creat: 0.98 mg/dL (ref 0.50–1.35)
Total Bilirubin: 0.8 mg/dL (ref 0.3–1.2)

## 2013-03-01 LAB — HEMOGLOBIN A1C: Hgb A1c MFr Bld: 9 % — ABNORMAL HIGH (ref <5.7)

## 2013-03-01 NOTE — Progress Notes (Signed)
  Subjective:    Patient ID: Ethan Davis, male    DOB: 08-19-1926, 79 y.o.   MRN: [id number]  HPI  Pt here to establish care, preivous PCP Dr. Eleanora Neighbor in [place] He is here today with his daughter who cares for him and make sure he makes appointments and have his medications Medications and history reviewed Long-standing history of diabetes mellitus he checks his blood sugars fasting they typically range 105-160 he is currently on metformin include the side without any difficulties. His last A1c is unknown to  long-standing hypertension he is currently on medication and is doing well with these. He has no history of coronary artery disease or heart attack or stroke   Hyperlipidemia currently on statin drug is due for repeat check on his lipids.  Lives in [place] will with his significant other. His children check on him on a regular basis. He still able to drive and perform his regular activities without any help. His heart has noticed that his memory is getting a little worse. There is no history of dementia in the family.  He is UTD on preventative care per daughter, due for Flu shot today  Review of Systems  GEN- denies fatigue, fever, weight loss,weakness, recent illness HEENT- denies eye drainage, change in vision, nasal discharge, CVS- denies chest pain, palpitations RESP- denies SOB, cough, wheeze ABD- denies N/V, change in stools, abd pain GU- denies dysuria, hematuria, dribbling, incontinence MSK- + joint pain, muscle aches, injury Neuro- denies headache, dizziness, syncope, seizure activity      Objective:   Physical Exam GEN- NAD, alert and oriented x3 HEENT- PERRL, EOMI, non injected sclera, pink conjunctiva,arcus senilis, MMM, oropharynx clear Neck- Supple,  CVS- RRR, no murmur RESP-CTAB ABD-NABS,soft,NT,ND EXT- No edema Pulses- Radial, DP- 2+ Psych- normal affect and mood        Assessment & Plan:

## 2013-03-01 NOTE — Patient Instructions (Addendum)
Stop the meloxicam Continue all other medications We will call with lab results Flu shot F/u 3 months

## 2013-03-02 DIAGNOSIS — E785 Hyperlipidemia, unspecified: Secondary | ICD-10-CM | POA: Insufficient documentation

## 2013-03-02 DIAGNOSIS — N400 Enlarged prostate without lower urinary tract symptoms: Secondary | ICD-10-CM | POA: Insufficient documentation

## 2013-03-02 DIAGNOSIS — I10 Essential (primary) hypertension: Secondary | ICD-10-CM | POA: Insufficient documentation

## 2013-03-02 DIAGNOSIS — M1990 Unspecified osteoarthritis, unspecified site: Secondary | ICD-10-CM | POA: Insufficient documentation

## 2013-03-02 DIAGNOSIS — E1121 Type 2 diabetes mellitus with diabetic nephropathy: Secondary | ICD-10-CM | POA: Insufficient documentation

## 2013-03-02 NOTE — Assessment & Plan Note (Signed)
Check cholesterol panel and his LFT

## 2013-03-02 NOTE — Assessment & Plan Note (Signed)
Blood pressure is fairly well-controlled. He is on a inhibitor as well secondary to his diabetes mellitus

## 2013-03-02 NOTE — Assessment & Plan Note (Signed)
A1c will be checked. He will be continued on his glipizide and metformin. I'll also obtain his records from his previous physician

## 2013-03-02 NOTE — Assessment & Plan Note (Signed)
He was told he has osteoarthritis he also has chronic back pain he was taking meloxicam states it does not help so therefore he takes Aleve on top of the meloxicam. For now we will stop the meloxicam and let him use over-the-counter medication as needed

## 2013-03-09 ENCOUNTER — Other Ambulatory Visit: Payer: Self-pay | Admitting: Family Medicine

## 2013-03-09 MED ORDER — SITAGLIPTIN PHOSPHATE 25 MG PO TABS: 25.0000 mg | ORAL_TABLET | Freq: Every day | ORAL | Status: DC

## 2013-03-09 NOTE — Progress Notes (Signed)
LMTRC on dtrs vm

## 2013-03-11 ENCOUNTER — Telehealth: Payer: Self-pay | Admitting: Family Medicine

## 2013-03-11 NOTE — Telephone Encounter (Signed)
Please call he about her father Ethan Davis Call [phone]

## 2013-03-11 NOTE — Telephone Encounter (Signed)
Left message for daughter to return call. 

## 2013-05-30 ENCOUNTER — Encounter: Payer: Self-pay | Admitting: Family Medicine

## 2013-05-30 ENCOUNTER — Ambulatory Visit (INDEPENDENT_AMBULATORY_CARE_PROVIDER_SITE_OTHER): Payer: Medicare Other | Admitting: Family Medicine

## 2013-05-30 VITALS — BP 118/70 | HR 82 | Temp 98.3°F | Resp 16 | Ht 70.5 in | Wt 167.0 lb

## 2013-05-30 DIAGNOSIS — E785 Hyperlipidemia, unspecified: Secondary | ICD-10-CM | POA: Diagnosis not present

## 2013-05-30 DIAGNOSIS — IMO0001 Reserved for inherently not codable concepts without codable children: Secondary | ICD-10-CM | POA: Diagnosis not present

## 2013-05-30 DIAGNOSIS — I10 Essential (primary) hypertension: Secondary | ICD-10-CM | POA: Diagnosis not present

## 2013-05-30 DIAGNOSIS — G3184 Mild cognitive impairment, so stated: Secondary | ICD-10-CM | POA: Diagnosis not present

## 2013-05-30 DIAGNOSIS — Z23 Encounter for immunization: Secondary | ICD-10-CM

## 2013-05-30 DIAGNOSIS — R4189 Other symptoms and signs involving cognitive functions and awareness: Secondary | ICD-10-CM | POA: Insufficient documentation

## 2013-05-30 LAB — BASIC METABOLIC PANEL
BUN: 18 mg/dL (ref 6–23)
Calcium: 9.7 mg/dL (ref 8.4–10.5)
Creat: 0.99 mg/dL (ref 0.50–1.35)
Glucose, Bld: 116 mg/dL — ABNORMAL HIGH (ref 70–99)

## 2013-05-30 NOTE — Addendum Note (Signed)
Addended by: Milinda Antis F on: 05/30/2013 10:26 AM   Modules accepted: Orders

## 2013-05-30 NOTE — Assessment & Plan Note (Signed)
Blood pressure is well-controlled no change in the medication

## 2013-05-30 NOTE — Assessment & Plan Note (Signed)
LDL is at goal no change in medication 

## 2013-05-30 NOTE — Progress Notes (Signed)
   Subjective:    Patient ID: Ethan Davis, male    DOB: [birth year], 79 y.o.   MRN: [id number]  HPI  Patient to follow chronic medical problems. He's here with his wife today who is concerned about his memory. He has a couple episodes where he has to be reminded to do something. He continues to pay his bills he is also driving has not had any difficulties driving he has not been lost in the stores. His medications are asked by his daughter. He's not had any wandering or other abnormal behavior.  Diabetes mellitus his last A1c was 9%. He was started on Januvia along with his metformin and glipizide. His blood sugars fasting range 88-216 in the evening 124-298. Typically his high sugars on the day he gets a cold and corral which is every Friday. This does correspond with his blood sugars greater than 200. No hypoglycemia no polyuria no polydipsia   Review of Systems  GEN- denies fatigue, fever, weight loss,weakness, recent illness HEENT- denies eye drainage, change in vision, nasal discharge, CVS- denies chest pain, palpitations RESP- denies SOB, cough, wheeze ABD- denies N/V, change in stools, abd pain GU- denies dysuria, hematuria, dribbling, incontinence MSK- denies joint pain, muscle aches, injury Neuro- denies headache, dizziness, syncope, seizure activity      Objective:   Physical Exam GEN- NAD, alert and oriented x3 HEENT- PERRL, EOMI, non injected sclera, pink conjunctiva, MMM, oropharynx clear,arcus senilis Neck- Supple, no carotid bruit CVS- RRR, no murmur RESP-CTAB EXT- No edema Pulses- Radial, DP- 2+ Psych- normal affect and mood MMSE 25/30 Neuro- CNII-XII grossly in tact, no focal deficits       Assessment & Plan:

## 2013-05-30 NOTE — Addendum Note (Signed)
Addended by: Elvina Mattes T on: 05/30/2013 10:57 AM   Modules accepted: Orders

## 2013-05-30 NOTE — Patient Instructions (Signed)
Labs to be done we will call with results Prevnar 13 pneumonia Booster given F/U 3 months

## 2013-05-30 NOTE — Assessment & Plan Note (Signed)
He has very mild cognitive impairment I think that this is more due to his age and he probably had some vascular changes on MRI. At this time we will continue to control his risk factors. He does not need to start any medications at this time. He was given some things to do to help his memory such as reading and cross word puzzles

## 2013-05-30 NOTE — Assessment & Plan Note (Signed)
I will recheck his A1c and metabolic panel. He will continue current medications. Psych the days that he at [place] his blood sugars look fairly good especially for his age. Goal be to keep his A1c less than 8% He will attempt the leave the urine microalbumin. Prevnar 13 given

## 2013-05-31 ENCOUNTER — Telehealth: Payer: Self-pay | Admitting: Family Medicine

## 2013-05-31 NOTE — Telephone Encounter (Signed)
Alinda Money called and left a voicemail stating that her dad said that [facility] [place] however they need to [facility] Call back number is [phone]

## 2013-05-31 NOTE — Telephone Encounter (Signed)
Family wants to be sure all regular medication go to the [facility].  Does not need any refills right now.

## 2013-07-13 ENCOUNTER — Telehealth: Payer: Self-pay | Admitting: *Deleted

## 2013-07-13 MED ORDER — SITAGLIPTIN PHOSPHATE 25 MG PO TABS: 25.0000 mg | ORAL_TABLET | Freq: Every day | ORAL | Status: DC

## 2013-07-13 MED ORDER — METFORMIN HCL 1000 MG PO TABS: 1000.0000 mg | ORAL_TABLET | Freq: Two times a day (BID) | ORAL | Status: DC

## 2013-07-13 NOTE — Telephone Encounter (Signed)
[facility]   Metformin sent to [facility] at pt request

## 2013-07-13 NOTE — Telephone Encounter (Signed)
He should be on Metformin, Glipizide XL and Januvia, please see the last A1C result for more info

## 2013-07-13 NOTE — Telephone Encounter (Signed)
Daughter called wanting to know if you wanted pt on both Januvia and Metformin, said she thought you had said something about it but wastn sure.I looked on his med list saw he was on Januvia but metformin has not bee refilled.

## 2013-08-29 ENCOUNTER — Ambulatory Visit: Payer: Medicare Other | Admitting: Family Medicine

## 2013-09-27 ENCOUNTER — Ambulatory Visit (INDEPENDENT_AMBULATORY_CARE_PROVIDER_SITE_OTHER): Payer: Medicare Other | Admitting: Family Medicine

## 2013-09-27 ENCOUNTER — Encounter: Payer: Self-pay | Admitting: Family Medicine

## 2013-09-27 VITALS — BP 138/72 | HR 76 | Temp 97.9°F | Resp 18 | Ht 71.0 in | Wt 163.0 lb

## 2013-09-27 DIAGNOSIS — E1165 Type 2 diabetes mellitus with hyperglycemia: Secondary | ICD-10-CM

## 2013-09-27 DIAGNOSIS — G3184 Mild cognitive impairment, so stated: Secondary | ICD-10-CM

## 2013-09-27 DIAGNOSIS — I10 Essential (primary) hypertension: Secondary | ICD-10-CM

## 2013-09-27 DIAGNOSIS — IMO0001 Reserved for inherently not codable concepts without codable children: Secondary | ICD-10-CM | POA: Diagnosis not present

## 2013-09-27 LAB — CBC WITH DIFFERENTIAL/PLATELET
BASOS PCT: 0 % (ref 0–1)
Basophils Absolute: 0 10*3/uL (ref 0.0–0.1)
EOS ABS: 0.1 10*3/uL (ref 0.0–0.7)
Eosinophils Relative: 1 % (ref 0–5)
HEMATOCRIT: 35.4 % — AB (ref 39.0–52.0)
HEMOGLOBIN: 11.9 g/dL — AB (ref 13.0–17.0)
LYMPHS ABS: 1.9 10*3/uL (ref 0.7–4.0)
Lymphocytes Relative: 30 % (ref 12–46)
MCH: 30.4 pg (ref 26.0–34.0)
MCHC: 33.6 g/dL (ref 30.0–36.0)
MCV: 90.3 fL (ref 78.0–100.0)
MONO ABS: 0.6 10*3/uL (ref 0.1–1.0)
MONOS PCT: 10 % (ref 3–12)
NEUTROS PCT: 59 % (ref 43–77)
Neutro Abs: 3.7 10*3/uL (ref 1.7–7.7)
Platelets: 176 10*3/uL (ref 150–400)
RBC: 3.92 MIL/uL — AB (ref 4.22–5.81)
RDW: 13.6 % (ref 11.5–15.5)
WBC: 6.3 10*3/uL (ref 4.0–10.5)

## 2013-09-27 MED ORDER — DONEPEZIL HCL 5 MG PO TABS: 5.0000 mg | ORAL_TABLET | Freq: Every day | ORAL | Status: DC

## 2013-09-27 NOTE — Patient Instructions (Signed)
Give only 1/2 tablet of glipizide - diabetes pill New medicine for memory/dementia Continue all other medicines We will call with lab results F/U 2 months

## 2013-09-27 NOTE — Assessment & Plan Note (Signed)
Blood pressure is well controlled 

## 2013-09-27 NOTE — Assessment & Plan Note (Signed)
I will check his A1c. I will go ahead and decrease his glipizide to 5 mg once a day. We will set a new cold A1c less than 8% due to his age

## 2013-09-27 NOTE — Progress Notes (Signed)
Patient ID: Ethan Davis, male   DOB: 23-Jan-1927, 80 y.o.   MRN: [id number]   Subjective:    Patient ID: Ethan ReinJohn I Bartoszek, male    DOB: 23-Jan-1927, 80 y.o.   MRN: [id number]  Patient presents for memory issues  Issue here with his common-law wife. She's noticed that he's had worsening reissue so the past couple months. He does have some mild cognitive impairment he scored 25/30 on her Mini-Mental Status in December. He's not had any difficulty getting loss or difficulty paying bills are getting groceries however now appears to forget very little things right after he has been told what to do. She describes an incident where he was supposed to pick up once for them and what to do for source and came back with nothing. He's not been acting any differently has not had any behavioral issues or any aggression.  His blood sugars have been a little low on a few occasions but they do not have his meter with him he is taking Januvia as well as glipizide. His blood pressure has looked good per report.   Review Of Systems:  GEN- denies fatigue, fever, weight loss,weakness, recent illness HEENT- denies eye drainage, change in vision, nasal discharge, CVS- denies chest pain, palpitations RESP- denies SOB, cough, wheeze ABD- denies N/V, change in stools, abd pain GU- denies dysuria, hematuria, dribbling, incontinence MSK- denies joint pain, muscle aches, injury Neuro- denies headache, dizziness, syncope, seizure activity       Objective:    BP 138/72  Pulse 76  Temp(Src) 97.9 F (36.6 C) (Oral)  Resp 18  Ht 5\' 11"  (1.803 m)  Wt 163 lb (73.936 kg)  BMI 22.74 kg/m2 GEN- NAD, alert and oriented x3 HEENT- PERRL, EOMI, non injected sclera, pink conjunctiva, MMM, oropharynx clear,arcus senilis Neck- Supple, no carotid bruit CVS- RRR, no murmur RESP-CTAB EXT- No edema Pulses- Radial, DP- 2+ Psych- normal affect and mood MMSE 23/30 Neuro- CNII-XII grossly in tact, no focal deficits        Assessment & Plan:      Problem List Items Addressed This Visit   Type II or unspecified type diabetes mellitus without mention of complication, uncontrolled - Primary   Relevant Orders      CBC with Differential      Comprehensive metabolic panel      Hemoglobin A1c   Mild cognitive impairment   Relevant Orders      RPR   Essential hypertension, benign   Relevant Orders      TSH      Note: This dictation was prepared with Dragon dictation along with smaller phrase technology. Any transcriptional errors that result from this process are unintentional.

## 2013-09-27 NOTE — Assessment & Plan Note (Signed)
He does have some worsening cognitive changes. I will go ahead and start him on Aricept 5 mg once a day. His labs will also be checked for his diabetes and other chronic medical problems. I see no signs of infection. We will hold off on brain imaging at this time

## 2013-09-28 ENCOUNTER — Other Ambulatory Visit: Payer: Self-pay | Admitting: *Deleted

## 2013-09-28 LAB — COMPREHENSIVE METABOLIC PANEL
ALBUMIN: 4.2 g/dL (ref 3.5–5.2)
ALK PHOS: 61 U/L (ref 39–117)
ALT: 9 U/L (ref 0–53)
AST: 16 U/L (ref 0–37)
BILIRUBIN TOTAL: 0.5 mg/dL (ref 0.2–1.2)
BUN: 17 mg/dL (ref 6–23)
CO2: 25 meq/L (ref 19–32)
Calcium: 9.3 mg/dL (ref 8.4–10.5)
Chloride: 98 mEq/L (ref 96–112)
Creat: 0.98 mg/dL (ref 0.50–1.35)
GLUCOSE: 245 mg/dL — AB (ref 70–99)
Potassium: 4.4 mEq/L (ref 3.5–5.3)
SODIUM: 134 meq/L — AB (ref 135–145)
TOTAL PROTEIN: 6.6 g/dL (ref 6.0–8.3)

## 2013-09-28 LAB — HEMOGLOBIN A1C
HEMOGLOBIN A1C: 6.7 % — AB (ref <5.7)
MEAN PLASMA GLUCOSE: 146 mg/dL — AB (ref <117)

## 2013-09-28 LAB — RPR

## 2013-09-28 LAB — TSH: TSH: 2.368 u[IU]/mL (ref 0.350–4.500)

## 2013-09-28 MED ORDER — DONEPEZIL HCL 5 MG PO TABS: 5.0000 mg | ORAL_TABLET | Freq: Every day | ORAL | Status: DC

## 2013-09-28 NOTE — Telephone Encounter (Signed)
Requested to [facility] and then to have prescription sent to mail order.   Advised that prescription was sent to mail order on 09/27/2013.  [facility]

## 2013-10-03 ENCOUNTER — Other Ambulatory Visit: Payer: Self-pay | Admitting: Family Medicine

## 2013-10-04 NOTE — Telephone Encounter (Signed)
Medication refilled per protocol. 

## 2013-10-23 ENCOUNTER — Other Ambulatory Visit: Payer: Self-pay | Admitting: Family Medicine

## 2013-10-24 NOTE — Telephone Encounter (Signed)
Refill appropriate and filled per protocol. 

## 2013-12-07 DIAGNOSIS — M545 Low back pain, unspecified: Secondary | ICD-10-CM | POA: Diagnosis not present

## 2013-12-07 DIAGNOSIS — M5137 Other intervertebral disc degeneration, lumbosacral region: Secondary | ICD-10-CM | POA: Diagnosis not present

## 2013-12-07 DIAGNOSIS — M999 Biomechanical lesion, unspecified: Secondary | ICD-10-CM | POA: Diagnosis not present

## 2013-12-12 DIAGNOSIS — M545 Low back pain, unspecified: Secondary | ICD-10-CM | POA: Diagnosis not present

## 2013-12-12 DIAGNOSIS — M5137 Other intervertebral disc degeneration, lumbosacral region: Secondary | ICD-10-CM | POA: Diagnosis not present

## 2013-12-12 DIAGNOSIS — M999 Biomechanical lesion, unspecified: Secondary | ICD-10-CM | POA: Diagnosis not present

## 2013-12-14 DIAGNOSIS — M545 Low back pain, unspecified: Secondary | ICD-10-CM | POA: Diagnosis not present

## 2013-12-14 DIAGNOSIS — M999 Biomechanical lesion, unspecified: Secondary | ICD-10-CM | POA: Diagnosis not present

## 2013-12-14 DIAGNOSIS — M5137 Other intervertebral disc degeneration, lumbosacral region: Secondary | ICD-10-CM | POA: Diagnosis not present

## 2013-12-16 DIAGNOSIS — M999 Biomechanical lesion, unspecified: Secondary | ICD-10-CM | POA: Diagnosis not present

## 2013-12-16 DIAGNOSIS — M5137 Other intervertebral disc degeneration, lumbosacral region: Secondary | ICD-10-CM | POA: Diagnosis not present

## 2013-12-16 DIAGNOSIS — M545 Low back pain, unspecified: Secondary | ICD-10-CM | POA: Diagnosis not present

## 2013-12-19 DIAGNOSIS — M545 Low back pain, unspecified: Secondary | ICD-10-CM | POA: Diagnosis not present

## 2013-12-19 DIAGNOSIS — M5137 Other intervertebral disc degeneration, lumbosacral region: Secondary | ICD-10-CM | POA: Diagnosis not present

## 2013-12-19 DIAGNOSIS — M999 Biomechanical lesion, unspecified: Secondary | ICD-10-CM | POA: Diagnosis not present

## 2013-12-20 ENCOUNTER — Encounter: Payer: Self-pay | Admitting: Family Medicine

## 2013-12-20 ENCOUNTER — Ambulatory Visit (INDEPENDENT_AMBULATORY_CARE_PROVIDER_SITE_OTHER): Payer: Medicare Other | Admitting: Family Medicine

## 2013-12-20 VITALS — BP 128/66 | HR 72 | Temp 97.5°F | Resp 14 | Ht 70.0 in | Wt 158.0 lb

## 2013-12-20 DIAGNOSIS — G3184 Mild cognitive impairment, so stated: Secondary | ICD-10-CM

## 2013-12-20 DIAGNOSIS — M545 Low back pain, unspecified: Secondary | ICD-10-CM

## 2013-12-20 DIAGNOSIS — E785 Hyperlipidemia, unspecified: Secondary | ICD-10-CM

## 2013-12-20 DIAGNOSIS — I10 Essential (primary) hypertension: Secondary | ICD-10-CM | POA: Diagnosis not present

## 2013-12-20 DIAGNOSIS — M8949 Other hypertrophic osteoarthropathy, multiple sites: Secondary | ICD-10-CM

## 2013-12-20 DIAGNOSIS — IMO0001 Reserved for inherently not codable concepts without codable children: Secondary | ICD-10-CM | POA: Diagnosis not present

## 2013-12-20 DIAGNOSIS — M159 Polyosteoarthritis, unspecified: Secondary | ICD-10-CM

## 2013-12-20 DIAGNOSIS — E1165 Type 2 diabetes mellitus with hyperglycemia: Principal | ICD-10-CM

## 2013-12-20 DIAGNOSIS — M150 Primary generalized (osteo)arthritis: Secondary | ICD-10-CM

## 2013-12-20 NOTE — Assessment & Plan Note (Signed)
He'll continue acetaminophen as needed

## 2013-12-20 NOTE — Patient Instructions (Signed)
Take tylenol arthritis for your back Get the labs done next Thursday or Friday ( July 24th)- FASTING Continue all other medications F/U 4 months

## 2013-12-20 NOTE — Assessment & Plan Note (Signed)
Based on his age his goal A1c is less than 8%. At the last visit I decreased his glipizide to 5 mg once a day he does not have his medications or his meter with him so I cannot verify anything. Will have him get the labs will contact his daughter who prepares his medication box in the system. May be able to discontinue some of the medication. He may have to come off the metformin once I see his renal function

## 2013-12-20 NOTE — Assessment & Plan Note (Signed)
Blood pressure is controlled

## 2013-12-20 NOTE — Progress Notes (Signed)
Patient ID: Ethan Davis [place], male   DOB: April 26, 1927, 80 y.o.   MRN: [id number]   Subjective:    Patient ID: Ethan Davis [place], male    DOB: April 26, 1927, 80 y.o.   MRN: [id number]  Patient presents for 3 month F/U  patient here to followup medical problems. He did not bring his medications or his blood glucose meter. His daughter typically takes care of all his medications. He does not have any specific concerns today. States that he had some low back pain a couple months ago he went to the chiropractor and this is now improved. He occasionally takes a Tylenol or Aleve. His common-law wife states that his memory is improved with the Aricept he does not notice any change. He is due for fasting labs for diabetes and cholesterol.    Review Of Systems:  GEN- denies fatigue, fever, weight loss,weakness, recent illness HEENT- denies eye drainage, change in vision, nasal discharge, CVS- denies chest pain, palpitations RESP- denies SOB, cough, wheeze ABD- denies N/V, change in stools, abd pain GU- denies dysuria, hematuria, dribbling, incontinence MSK- + joint pain, muscle aches, injury Neuro- denies headache, dizziness, syncope, seizure activity       Objective:    BP 128/66  Pulse 72  Temp(Src) 97.5 F (36.4 C) (Oral)  Resp 14  Ht 5\' 10"  (1.778 m)  Wt 158 lb (71.668 kg)  BMI 22.67 kg/m2 GEN- NAD, alert and oriented x3 HEENT- PERRL, EOMI, non injected sclera, pink conjunctiva, MMM, oropharynx clear,arcus senilis CVS- RRR, no murmur RESP-CTAB MSK-  Spine NT, neg SLR, decreased ROM of spine, fair ROM hips and knees EXT- No edema Pulses- Radial, DP- 2+      Assessment & Plan:      Problem List Items Addressed This Visit   Type II or unspecified type diabetes mellitus without mention of complication, uncontrolled - Primary   Relevant Orders      Comprehensive metabolic panel      Hemoglobin A1c      CBC with Differential   OA (osteoarthritis)   Mild cognitive impairment   Lumbar  back pain   Hyperlipidemia   Relevant Orders      Lipid panel   Essential hypertension, benign   Relevant Orders      Comprehensive metabolic panel      CBC with Differential      Note: This dictation was prepared with Dragon dictation along with smaller phrase technology. Any transcriptional errors that result from this process are unintentional.

## 2013-12-20 NOTE — Assessment & Plan Note (Signed)
His wife shows some improvement. Will continue him on Aricept

## 2013-12-20 NOTE — Assessment & Plan Note (Signed)
His exam is fairly benign today he started seeing a chiropractor. We'll continue to use acetaminophen as needed for back pain. If he gets worse I recommend an x-ray

## 2013-12-23 DIAGNOSIS — M5137 Other intervertebral disc degeneration, lumbosacral region: Secondary | ICD-10-CM | POA: Diagnosis not present

## 2013-12-23 DIAGNOSIS — M545 Low back pain, unspecified: Secondary | ICD-10-CM | POA: Diagnosis not present

## 2013-12-23 DIAGNOSIS — M999 Biomechanical lesion, unspecified: Secondary | ICD-10-CM | POA: Diagnosis not present

## 2013-12-26 DIAGNOSIS — M999 Biomechanical lesion, unspecified: Secondary | ICD-10-CM | POA: Diagnosis not present

## 2013-12-26 DIAGNOSIS — M545 Low back pain, unspecified: Secondary | ICD-10-CM | POA: Diagnosis not present

## 2013-12-26 DIAGNOSIS — M546 Pain in thoracic spine: Secondary | ICD-10-CM | POA: Diagnosis not present

## 2013-12-29 DIAGNOSIS — E785 Hyperlipidemia, unspecified: Secondary | ICD-10-CM | POA: Diagnosis not present

## 2013-12-29 DIAGNOSIS — I10 Essential (primary) hypertension: Secondary | ICD-10-CM | POA: Diagnosis not present

## 2013-12-29 DIAGNOSIS — IMO0001 Reserved for inherently not codable concepts without codable children: Secondary | ICD-10-CM | POA: Diagnosis not present

## 2013-12-29 LAB — COMPREHENSIVE METABOLIC PANEL
ALK PHOS: 57 U/L (ref 39–117)
ALT: <8 U/L (ref 0–53)
AST: 14 U/L (ref 0–37)
Albumin: 4.1 g/dL (ref 3.5–5.2)
BILIRUBIN TOTAL: 0.6 mg/dL (ref 0.2–1.2)
BUN: 15 mg/dL (ref 6–23)
CO2: 28 mEq/L (ref 19–32)
Calcium: 9.7 mg/dL (ref 8.4–10.5)
Chloride: 100 mEq/L (ref 96–112)
Creat: 1.04 mg/dL (ref 0.50–1.35)
GLUCOSE: 128 mg/dL — AB (ref 70–99)
Potassium: 4.3 mEq/L (ref 3.5–5.3)
SODIUM: 137 meq/L (ref 135–145)
Total Protein: 6.8 g/dL (ref 6.0–8.3)

## 2013-12-29 LAB — CBC WITH DIFFERENTIAL/PLATELET
BASOS ABS: 0 10*3/uL (ref 0.0–0.1)
BASOS PCT: 0 % (ref 0–1)
EOS ABS: 0.1 10*3/uL (ref 0.0–0.7)
EOS PCT: 2 % (ref 0–5)
HCT: 37.2 % — ABNORMAL LOW (ref 39.0–52.0)
Hemoglobin: 12.7 g/dL — ABNORMAL LOW (ref 13.0–17.0)
Lymphocytes Relative: 36 % (ref 12–46)
Lymphs Abs: 2.3 10*3/uL (ref 0.7–4.0)
MCH: 30.2 pg (ref 26.0–34.0)
MCHC: 34.1 g/dL (ref 30.0–36.0)
MCV: 88.4 fL (ref 78.0–100.0)
Monocytes Absolute: 0.5 10*3/uL (ref 0.1–1.0)
Monocytes Relative: 8 % (ref 3–12)
Neutro Abs: 3.5 10*3/uL (ref 1.7–7.7)
Neutrophils Relative %: 54 % (ref 43–77)
PLATELETS: 157 10*3/uL (ref 150–400)
RBC: 4.21 MIL/uL — ABNORMAL LOW (ref 4.22–5.81)
RDW: 13.4 % (ref 11.5–15.5)
WBC: 6.4 10*3/uL (ref 4.0–10.5)

## 2013-12-29 LAB — LIPID PANEL
CHOL/HDL RATIO: 1.8 ratio
CHOLESTEROL: 150 mg/dL (ref 0–200)
HDL: 84 mg/dL (ref >39)
LDL Cholesterol: 56 mg/dL (ref 0–99)
Triglycerides: 50 mg/dL (ref <150)
VLDL: 10 mg/dL (ref 0–40)

## 2013-12-29 LAB — HEMOGLOBIN A1C
Hgb A1c MFr Bld: 7.2 % — ABNORMAL HIGH (ref <5.7)
MEAN PLASMA GLUCOSE: 160 mg/dL — AB (ref <117)

## 2013-12-30 ENCOUNTER — Telehealth: Payer: Self-pay | Admitting: *Deleted

## 2013-12-30 DIAGNOSIS — M545 Low back pain, unspecified: Secondary | ICD-10-CM | POA: Diagnosis not present

## 2013-12-30 DIAGNOSIS — M546 Pain in thoracic spine: Secondary | ICD-10-CM | POA: Diagnosis not present

## 2013-12-30 DIAGNOSIS — M5137 Other intervertebral disc degeneration, lumbosacral region: Secondary | ICD-10-CM | POA: Diagnosis not present

## 2013-12-30 DIAGNOSIS — M999 Biomechanical lesion, unspecified: Secondary | ICD-10-CM | POA: Diagnosis not present

## 2013-12-30 MED ORDER — SIMVASTATIN 20 MG PO TABS: 20.0000 mg | ORAL_TABLET | Freq: Every day | ORAL | Status: DC

## 2013-12-30 MED ORDER — GLIPIZIDE 10 MG PO TABS: 5.0000 mg | ORAL_TABLET | Freq: Every day | ORAL | Status: DC

## 2013-12-30 MED ORDER — DOXAZOSIN MESYLATE 4 MG PO TABS: 4.0000 mg | ORAL_TABLET | Freq: Every day | ORAL | Status: DC

## 2013-12-30 MED ORDER — LISINOPRIL-HYDROCHLOROTHIAZIDE 10-12.5 MG PO TABS: 1.0000 | ORAL_TABLET | Freq: Every day | ORAL | Status: DC

## 2013-12-30 MED ORDER — DONEPEZIL HCL 5 MG PO TABS: ORAL_TABLET | ORAL | Status: DC

## 2013-12-30 MED ORDER — METFORMIN HCL 1000 MG PO TABS: 1000.0000 mg | ORAL_TABLET | Freq: Two times a day (BID) | ORAL | Status: DC

## 2013-12-30 MED ORDER — FINASTERIDE 5 MG PO TABS: 5.0000 mg | ORAL_TABLET | Freq: Every day | ORAL | Status: DC

## 2013-12-30 MED ORDER — SITAGLIPTIN PHOSPHATE 25 MG PO TABS: ORAL_TABLET | ORAL | Status: DC

## 2013-12-30 NOTE — Telephone Encounter (Signed)
Refill appropriate and filled per protocol. 

## 2014-04-11 DIAGNOSIS — Z23 Encounter for immunization: Secondary | ICD-10-CM | POA: Diagnosis not present

## 2014-05-09 ENCOUNTER — Ambulatory Visit (INDEPENDENT_AMBULATORY_CARE_PROVIDER_SITE_OTHER): Payer: Medicare Other | Admitting: Family Medicine

## 2014-05-09 ENCOUNTER — Encounter: Payer: Self-pay | Admitting: Family Medicine

## 2014-05-09 VITALS — BP 122/60 | HR 64 | Temp 97.6°F | Resp 16 | Ht 71.0 in | Wt 162.0 lb

## 2014-05-09 DIAGNOSIS — E119 Type 2 diabetes mellitus without complications: Secondary | ICD-10-CM | POA: Diagnosis not present

## 2014-05-09 DIAGNOSIS — I10 Essential (primary) hypertension: Secondary | ICD-10-CM | POA: Diagnosis not present

## 2014-05-09 DIAGNOSIS — N400 Enlarged prostate without lower urinary tract symptoms: Secondary | ICD-10-CM | POA: Diagnosis not present

## 2014-05-09 DIAGNOSIS — G3184 Mild cognitive impairment, so stated: Secondary | ICD-10-CM

## 2014-05-09 LAB — CBC WITH DIFFERENTIAL/PLATELET
BASOS PCT: 0 % (ref 0–1)
Basophils Absolute: 0 10*3/uL (ref 0.0–0.1)
EOS ABS: 0.1 10*3/uL (ref 0.0–0.7)
Eosinophils Relative: 1 % (ref 0–5)
HEMATOCRIT: 36.5 % — AB (ref 39.0–52.0)
Hemoglobin: 12.5 g/dL — ABNORMAL LOW (ref 13.0–17.0)
Lymphocytes Relative: 34 % (ref 12–46)
Lymphs Abs: 2.3 10*3/uL (ref 0.7–4.0)
MCH: 30.7 pg (ref 26.0–34.0)
MCHC: 34.2 g/dL (ref 30.0–36.0)
MCV: 89.7 fL (ref 78.0–100.0)
MONOS PCT: 8 % (ref 3–12)
MPV: 10.2 fL (ref 9.4–12.4)
Monocytes Absolute: 0.5 10*3/uL (ref 0.1–1.0)
NEUTROS PCT: 57 % (ref 43–77)
Neutro Abs: 3.8 10*3/uL (ref 1.7–7.7)
PLATELETS: 154 10*3/uL (ref 150–400)
RBC: 4.07 MIL/uL — ABNORMAL LOW (ref 4.22–5.81)
RDW: 13.3 % (ref 11.5–15.5)
WBC: 6.7 10*3/uL (ref 4.0–10.5)

## 2014-05-09 LAB — COMPREHENSIVE METABOLIC PANEL
ALT: 11 U/L (ref 0–53)
AST: 15 U/L (ref 0–37)
Albumin: 4.1 g/dL (ref 3.5–5.2)
Alkaline Phosphatase: 69 U/L (ref 39–117)
BUN: 14 mg/dL (ref 6–23)
CALCIUM: 9.5 mg/dL (ref 8.4–10.5)
CHLORIDE: 101 meq/L (ref 96–112)
CO2: 29 meq/L (ref 19–32)
CREATININE: 1 mg/dL (ref 0.50–1.35)
Glucose, Bld: 126 mg/dL — ABNORMAL HIGH (ref 70–99)
POTASSIUM: 4.6 meq/L (ref 3.5–5.3)
SODIUM: 139 meq/L (ref 135–145)
TOTAL PROTEIN: 6.8 g/dL (ref 6.0–8.3)
Total Bilirubin: 0.4 mg/dL (ref 0.2–1.2)

## 2014-05-09 LAB — HEMOGLOBIN A1C
HEMOGLOBIN A1C: 7.6 % — AB (ref <5.7)
MEAN PLASMA GLUCOSE: 171 mg/dL — AB (ref <117)

## 2014-05-09 LAB — LIPID PANEL
CHOLESTEROL: 169 mg/dL (ref 0–200)
HDL: 81 mg/dL (ref >39)
LDL Cholesterol: 73 mg/dL (ref 0–99)
Total CHOL/HDL Ratio: 2.1 Ratio
Triglycerides: 75 mg/dL (ref <150)
VLDL: 15 mg/dL (ref 0–40)

## 2014-05-09 LAB — MICROALBUMIN / CREATININE URINE RATIO
Creatinine, Urine: 104.2 mg/dL
MICROALB UR: 0.9 mg/dL (ref <2.0)
MICROALB/CREAT RATIO: 8.6 mg/g (ref 0.0–30.0)

## 2014-05-09 MED ORDER — DONEPEZIL HCL 10 MG PO TABS: ORAL_TABLET | ORAL | Status: DC

## 2014-05-09 NOTE — Patient Instructions (Signed)
Memory medication increased to 10mg  once a day We will call with lab results F/U 8 weeks

## 2014-05-09 NOTE — Assessment & Plan Note (Signed)
Well controlled, no change to meds 

## 2014-05-09 NOTE — Assessment & Plan Note (Signed)
Last A1C 7.2%  Which looks okay, would like to keep him around 7 unless he is getting hyoglycemic, then 8% would be ceiling Flu shot done per report at [facility] but we could not verify this

## 2014-05-09 NOTE — Progress Notes (Signed)
Patient ID: Ethan Davis, male   DOB: 10-04-1926, 80 y.o.   MRN: [id number]   Subjective:    Patient ID: Ethan Davis, male    DOB: 10-04-1926, 80 y.o.   MRN: [id number]  Patient presents for 6 month F/U and Memory Issues  Pt here to f/u chronic medical problems DM- unsure what CBG run, his daughter who cares for him is out of town He is taking meds per report  Wife states his memory is getting worse, he has poor short term memory, he still drives but very minimally, he does not read or watch TV but he has always been like this. His daughter is in charge of his finances. He does admit his memory is bad.    Review Of Systems:  GEN- denies fatigue, fever, weight loss,weakness, recent illness HEENT- denies eye drainage, change in vision, nasal discharge, CVS- denies chest pain, palpitations RESP- denies SOB, cough, wheeze ABD- denies N/V, change in stools, abd pain GU- denies dysuria, hematuria, dribbling, incontinence MSK- + joint pain, muscle aches, injury Neuro- denies headache, dizziness, syncope, seizure activity       Objective:    BP 122/60 mmHg  Pulse 64  Temp(Src) 97.6 F (36.4 C) (Oral)  Resp 16  Ht 5\' 11"  (1.803 m)  Wt 162 lb (73.483 kg)  BMI 22.60 kg/m2 GEN- NAD, alert and oriented x3 HEENT- PERRL, EOMI, non injected sclera, pink conjunctiva, MMM, oropharynx clear,arcus senilis Neck- Supple, no carotid bruit CVS- RRR, no murmur RESP-CTAB EXT- No edema Pulses- Radial, DP- 2+ Psych- normal affect and mood  MMSE- 22/30     Assessment & Plan:      Problem List Items Addressed This Visit    Mild cognitive impairment    MMSE about the same as in April, incresae Aricept to 10mg  as they do see some benefit Will speak to his daughter, difficult to get history from pt or his wife    Essential hypertension, benign    Well controlled, no change to meds    Diabetes mellitus type II, controlled - Primary    Last A1C 7.2%  Which looks okay, would like to keep him  around 7 unless he is getting hyoglycemic, then 8% would be ceiling Flu shot done per report at [facility] but we could not verify this    Relevant Orders      CBC with Differential      Comprehensive metabolic panel      Lipid panel      Hemoglobin A1c      Microalbumin / creatinine urine ratio   BPH (benign prostatic hypertrophy)      Note: This dictation was prepared with Dragon dictation along with smaller phrase technology. Any transcriptional errors that result from this process are unintentional.

## 2014-05-09 NOTE — Assessment & Plan Note (Signed)
MMSE about the same as in April, incresae Aricept to 10mg  as they do see some benefit Will speak to his daughter, difficult to get history from pt or his wife

## 2014-05-15 ENCOUNTER — Encounter: Payer: Self-pay | Admitting: *Deleted

## 2014-05-29 ENCOUNTER — Telehealth: Payer: Self-pay | Admitting: *Deleted

## 2014-05-29 NOTE — Telephone Encounter (Signed)
-----   Message from Salley ScarletKawanta F [place], MD sent at 05/29/2014 11:42 AM EST ----- Regarding: FW: Call  daughter  Call pt daughter, I increased his memory pill- aricpet at that last visit,he has an appt in 8 weeks, to see how this is doing  Also his sugar has gone up, I could not tell if she has been taking all of his medications as prescribed.     Put in phone note  ----- Message -----    From: Salley ScarletKawanta F [place], MD    Sent: 05/16/2014      To: Salley ScarletKawanta F [place], MD Subject: Call  daughter

## 2014-05-29 NOTE — Telephone Encounter (Signed)
noted 

## 2014-05-29 NOTE — Telephone Encounter (Signed)
Call placed to patient daughter, [name].   Patient daughter states that patient health seems to be ok, but patient does become somewhat agitated about medication at times.   States that patient acts as if he doesn't want to think for himself, but be told what to do, but then he becomes upset with being told what to do.   Also reports that patient is taking his DM medications, but he needs to watch what he is eating. States that while is up and more active, it's ok for his to have snacks, but while he is more sedentary, he needs to watch what he eats.   MD to be made aware.

## 2014-07-04 ENCOUNTER — Ambulatory Visit (INDEPENDENT_AMBULATORY_CARE_PROVIDER_SITE_OTHER): Payer: Medicare Other | Admitting: Family Medicine

## 2014-07-04 ENCOUNTER — Encounter: Payer: Self-pay | Admitting: Family Medicine

## 2014-07-04 VITALS — BP 120/62 | HR 84 | Temp 97.7°F | Resp 16 | Ht 71.0 in | Wt 156.0 lb

## 2014-07-04 DIAGNOSIS — G3184 Mild cognitive impairment, so stated: Secondary | ICD-10-CM | POA: Diagnosis not present

## 2014-07-04 DIAGNOSIS — E119 Type 2 diabetes mellitus without complications: Secondary | ICD-10-CM | POA: Diagnosis not present

## 2014-07-04 DIAGNOSIS — R634 Abnormal weight loss: Secondary | ICD-10-CM

## 2014-07-04 NOTE — Patient Instructions (Signed)
Glucerna twice a day for meals  Continue aricept  Continue current medications F/U 4 month

## 2014-07-04 NOTE — Assessment & Plan Note (Signed)
I will continue the Aricept at current dose of 10 mg

## 2014-07-04 NOTE — Assessment & Plan Note (Addendum)
For his age his diabetes is well controlled we can get him to eat a more regular basis that think that his blood sugars will be more steady No changes to his medication

## 2014-07-04 NOTE — Progress Notes (Signed)
Patient ID: Ethan Davis, male   DOB: 1926-12-05, 81 y.o.   MRN: [id number]   Subjective:    Patient ID: Ethan Davis, male    DOB: 1926-12-05, 81 y.o.   MRN: [id number]  Patient presents for 8 week F/U  patient here for interim follow-up he is here today with his daughter. They've not noticed a lot of change with his Aricept at 10 mg she states that he seems to be steady she has not noticed any worsening symptoms. She has also been having him check his sugar as he does not do this on a regular basis at times it has been upwards to 200. His last A1c was 7.6 in December 2015. He has not had any hypoglycemia symptoms. He does not have the best diet he tends to eat a lot of snack foods throughout the day including oatmeal pies and peanut butter crackers  . He often does not eat anything for dinner his lunch is typically from the senior center the provides free meals and he will eat cereal O meal for breakfast.    Review Of Systems:  GEN- denies fatigue, fever, +weight loss,weakness, recent illness HEENT- denies eye drainage, change in vision, nasal discharge, CVS- denies chest pain, palpitations RESP- denies SOB, cough, wheeze ABD- denies N/V, change in stools, abd pain GU- denies dysuria, hematuria, dribbling, incontinence MSK- denies joint pain, muscle aches, injury Neuro- denies headache, dizziness, syncope, seizure activity       Objective:    BP 120/62 mmHg  Pulse 84  Temp(Src) 97.7 F (36.5 C) (Oral)  Resp 16  Ht 5\' 11"  (1.803 m)  Wt 156 lb (70.761 kg)  BMI 21.77 kg/m2 GEN- NAD, alert and oriented x3 HEENT- PERRL, EOMI, non injected sclera, pink conjunctiva, MMM, oropharynx clear,arcus senilis CVS- RRR, no murmur RESP-CTAB EXT- No edema Pulses- Radial 2+ Weight down 6lbs        Assessment & Plan:      Problem List Items Addressed This Visit    None      Note: This dictation was prepared with Dragon dictation along with smaller phrase technology. Any  transcriptional errors that result from this process are unintentional.

## 2014-07-04 NOTE — Assessment & Plan Note (Signed)
He has had some weight loss I think this is due to poor nutrition and him skipping meals typically in the evening. I discussed with the starter using glue Serna twice a day for meal supplementation also to get him seen which bread and small meals that he and his wife camper. Home by themselves as the children do not live with them.

## 2014-10-09 ENCOUNTER — Telehealth: Payer: Self-pay | Admitting: *Deleted

## 2014-10-09 NOTE — Telephone Encounter (Signed)
Pt friend [name] called stating that pt was prescribe Aricept 10mg ,states not working and doesn't see change wants to know if Dr.[place] can increase dose or prescribe something stronger. Please advise!  [facility]

## 2014-10-09 NOTE — Telephone Encounter (Signed)
Noted, will discuss at OV

## 2014-10-09 NOTE — Telephone Encounter (Signed)
Call placed to patient. Was advised that Ethan Davis is out of the home on vacation.   Advised patient that appointment is scheduled at the end of the month, and he should F/U with MD at that time.

## 2014-10-23 ENCOUNTER — Other Ambulatory Visit: Payer: Self-pay | Admitting: Family Medicine

## 2014-10-23 NOTE — Telephone Encounter (Signed)
Medication refilled per protocol. 

## 2014-11-07 ENCOUNTER — Ambulatory Visit (INDEPENDENT_AMBULATORY_CARE_PROVIDER_SITE_OTHER): Payer: Medicare Other | Admitting: Family Medicine

## 2014-11-07 ENCOUNTER — Encounter: Payer: Self-pay | Admitting: Family Medicine

## 2014-11-07 VITALS — BP 118/56 | HR 64 | Temp 98.5°F | Resp 12 | Ht 71.0 in | Wt 148.0 lb

## 2014-11-07 DIAGNOSIS — E785 Hyperlipidemia, unspecified: Secondary | ICD-10-CM | POA: Diagnosis not present

## 2014-11-07 DIAGNOSIS — R634 Abnormal weight loss: Secondary | ICD-10-CM | POA: Diagnosis not present

## 2014-11-07 DIAGNOSIS — N400 Enlarged prostate without lower urinary tract symptoms: Secondary | ICD-10-CM | POA: Diagnosis not present

## 2014-11-07 DIAGNOSIS — G3184 Mild cognitive impairment, so stated: Secondary | ICD-10-CM | POA: Diagnosis not present

## 2014-11-07 DIAGNOSIS — I10 Essential (primary) hypertension: Secondary | ICD-10-CM

## 2014-11-07 DIAGNOSIS — E119 Type 2 diabetes mellitus without complications: Secondary | ICD-10-CM

## 2014-11-07 NOTE — Assessment & Plan Note (Signed)
Recheck A1C, with his weight loss, may be able to decrease his Diabetic medications further

## 2014-11-07 NOTE — Assessment & Plan Note (Signed)
Blood pressure on low normotensive side for his age Decrease lisinopril HCTZ to 1/2 tablet Will leave teh cardura the same as this helps with his BPH

## 2014-11-07 NOTE — Assessment & Plan Note (Signed)
I tried to reiterate to pt daughter, that he is not eating well would benefit from supplements, he is still fairly active for his age, No further work up will be done at this time. I see no reason to add remeron at this time.  Note we also spent time discussing end of life care and living will

## 2014-11-07 NOTE — Patient Instructions (Signed)
Take 1/2 tablet of lisinopril HCTZ for blood pressure  Continue all other medications We will call with lab results F/U 3 months

## 2014-11-07 NOTE — Assessment & Plan Note (Signed)
Continue current medications. 

## 2014-11-07 NOTE — Progress Notes (Signed)
Patient ID: Ethan Davis Ken, male   DOB: 06/05/1927, 81 y.o.   MRN: [id number]   Subjective:  . ID: Ethan Davis Dicaprio, male    DOB: 06/05/1927, 81 y.o.   MRN: [id number]  Patient presents for 4 month F/U patient here with his daughter here to follow-up chronic medical problems. They have no particular concerns. He continues to lose weight he is down another 6lbs. He states appetite is good, but he does not eat a lot in the evening time. Has not had any ensure of boost, he does go to grocery store and out to eat. Daughter states food is in the home. DM- he has not checked his CBG but taking meds as prescribed, no hypoglycemia symptoms. Reviewed medications- daughter seems to think he has done well with aricept and there has not been any significant decline in the past year. He still drives, cooks, Adult nursegrocer shops    Review Of Systems:  GEN- denies fatigue, fever, weight loss,weakness, recent illness HEENT- denies eye drainage, change in vision, nasal discharge, CVS- denies chest pain, palpitations RESP- denies SOB, cough, wheeze ABD- denies N/V, change in stools, abd pain GU- denies dysuria, hematuria, dribbling, incontinence MSK- denies joint pain, muscle aches, injury Neuro- denies headache, dizziness, syncope, seizure activity       Objective:    BP 118/56 mmHg  Pulse 64  Temp(Src) 98.5 F (36.9 C) (Oral)  Resp 12  Ht 5\' 11"  (1.803 m)  Wt 148 lb (67.132 kg)  BMI 20.65 kg/m2 GEN- NAD, alert and oriented x3 HEENT- PERRL, EOMI, non injected sclera, pink conjunctiva, MMM, oropharynx clear Neck- Supple, no thyromegaly CVS- RRR, no murmur RESP-CTAB ABD-NABS,soft,NT,ND EXT- No edema Pulses- Radial, DP- 2+        Assessment & Plan:      Problem List Items Addressed This Visit    Hyperlipidemia   Relevant Orders   LDL Cholesterol, Direct   Essential hypertension, benign - Primary   Relevant Orders   CBC with Differential/Platelet   Comprehensive metabolic panel   Diabetes  mellitus type II, controlled   Relevant Orders   Hemoglobin A1c   BPH (benign prostatic hypertrophy)      Note: This dictation was prepared with Dragon dictation along with smaller phrase technology. Any transcriptional errors that result from this process are unintentional.

## 2014-11-08 LAB — CBC WITH DIFFERENTIAL/PLATELET
BASOS ABS: 0 10*3/uL (ref 0.0–0.1)
BASOS PCT: 0 % (ref 0–1)
EOS PCT: 1 % (ref 0–5)
Eosinophils Absolute: 0.1 10*3/uL (ref 0.0–0.7)
HCT: 34.3 % — ABNORMAL LOW (ref 39.0–52.0)
Hemoglobin: 11.5 g/dL — ABNORMAL LOW (ref 13.0–17.0)
LYMPHS PCT: 36 % (ref 12–46)
Lymphs Abs: 2 10*3/uL (ref 0.7–4.0)
MCH: 31 pg (ref 26.0–34.0)
MCHC: 33.5 g/dL (ref 30.0–36.0)
MCV: 92.5 fL (ref 78.0–100.0)
MONOS PCT: 9 % (ref 3–12)
MPV: 10.9 fL (ref 8.6–12.4)
Monocytes Absolute: 0.5 10*3/uL (ref 0.1–1.0)
NEUTROS PCT: 54 % (ref 43–77)
Neutro Abs: 3 10*3/uL (ref 1.7–7.7)
Platelets: 146 10*3/uL — ABNORMAL LOW (ref 150–400)
RBC: 3.71 MIL/uL — ABNORMAL LOW (ref 4.22–5.81)
RDW: 14 % (ref 11.5–15.5)
WBC: 5.5 10*3/uL (ref 4.0–10.5)

## 2014-11-08 LAB — COMPREHENSIVE METABOLIC PANEL
ALK PHOS: 65 U/L (ref 39–117)
ALT: 8 U/L (ref 0–53)
AST: 13 U/L (ref 0–37)
Albumin: 4 g/dL (ref 3.5–5.2)
BILIRUBIN TOTAL: 0.7 mg/dL (ref 0.2–1.2)
BUN: 19 mg/dL (ref 6–23)
CALCIUM: 9.1 mg/dL (ref 8.4–10.5)
CHLORIDE: 99 meq/L (ref 96–112)
CO2: 29 mEq/L (ref 19–32)
Creat: 0.94 mg/dL (ref 0.50–1.35)
Glucose, Bld: 181 mg/dL — ABNORMAL HIGH (ref 70–99)
Potassium: 4.1 mEq/L (ref 3.5–5.3)
SODIUM: 137 meq/L (ref 135–145)
TOTAL PROTEIN: 6.5 g/dL (ref 6.0–8.3)

## 2014-11-08 LAB — HEMOGLOBIN A1C
Hgb A1c MFr Bld: 8.6 % — ABNORMAL HIGH (ref <5.7)
MEAN PLASMA GLUCOSE: 200 mg/dL — AB (ref <117)

## 2014-11-08 LAB — LDL CHOLESTEROL, DIRECT: Direct LDL: 103 mg/dL — ABNORMAL HIGH

## 2014-12-29 ENCOUNTER — Other Ambulatory Visit: Payer: Self-pay | Admitting: Family Medicine

## 2015-01-01 NOTE — Telephone Encounter (Signed)
Medication refilled per protocol. 

## 2015-02-09 ENCOUNTER — Ambulatory Visit (INDEPENDENT_AMBULATORY_CARE_PROVIDER_SITE_OTHER): Payer: Medicare Other | Admitting: Family Medicine

## 2015-02-09 ENCOUNTER — Encounter: Payer: Self-pay | Admitting: Family Medicine

## 2015-02-09 VITALS — BP 128/70 | HR 68 | Temp 98.1°F | Resp 14 | Ht 71.0 in | Wt 157.0 lb

## 2015-02-09 DIAGNOSIS — E785 Hyperlipidemia, unspecified: Secondary | ICD-10-CM | POA: Diagnosis not present

## 2015-02-09 DIAGNOSIS — G3184 Mild cognitive impairment, so stated: Secondary | ICD-10-CM

## 2015-02-09 DIAGNOSIS — Z23 Encounter for immunization: Secondary | ICD-10-CM

## 2015-02-09 DIAGNOSIS — I10 Essential (primary) hypertension: Secondary | ICD-10-CM | POA: Diagnosis not present

## 2015-02-09 DIAGNOSIS — E119 Type 2 diabetes mellitus without complications: Secondary | ICD-10-CM

## 2015-02-09 LAB — CBC WITH DIFFERENTIAL/PLATELET
Basophils Absolute: 0 10*3/uL (ref 0.0–0.1)
Basophils Relative: 0 % (ref 0–1)
Eosinophils Absolute: 0.1 10*3/uL (ref 0.0–0.7)
Eosinophils Relative: 1 % (ref 0–5)
HCT: 34.1 % — ABNORMAL LOW (ref 39.0–52.0)
HEMOGLOBIN: 11.7 g/dL — AB (ref 13.0–17.0)
Lymphocytes Relative: 32 % (ref 12–46)
Lymphs Abs: 1.8 10*3/uL (ref 0.7–4.0)
MCH: 30.9 pg (ref 26.0–34.0)
MCHC: 34.3 g/dL (ref 30.0–36.0)
MCV: 90 fL (ref 78.0–100.0)
MPV: 10.1 fL (ref 8.6–12.4)
Monocytes Absolute: 0.5 10*3/uL (ref 0.1–1.0)
Monocytes Relative: 9 % (ref 3–12)
NEUTROS ABS: 3.2 10*3/uL (ref 1.7–7.7)
Neutrophils Relative %: 58 % (ref 43–77)
Platelets: 152 10*3/uL (ref 150–400)
RBC: 3.79 MIL/uL — AB (ref 4.22–5.81)
RDW: 13.5 % (ref 11.5–15.5)
WBC: 5.6 10*3/uL (ref 4.0–10.5)

## 2015-02-09 LAB — COMPREHENSIVE METABOLIC PANEL
ALBUMIN: 4 g/dL (ref 3.6–5.1)
ALK PHOS: 65 U/L (ref 40–115)
ALT: 8 U/L — AB (ref 9–46)
AST: 15 U/L (ref 10–35)
BILIRUBIN TOTAL: 0.3 mg/dL (ref 0.2–1.2)
BUN: 16 mg/dL (ref 7–25)
CALCIUM: 9.5 mg/dL (ref 8.6–10.3)
CO2: 30 mmol/L (ref 20–31)
CREATININE: 1.07 mg/dL (ref 0.70–1.11)
Chloride: 101 mmol/L (ref 98–110)
Glucose, Bld: 88 mg/dL (ref 70–99)
Potassium: 4.3 mmol/L (ref 3.5–5.3)
SODIUM: 139 mmol/L (ref 135–146)
Total Protein: 6.6 g/dL (ref 6.1–8.1)

## 2015-02-09 LAB — LIPID PANEL
CHOL/HDL RATIO: 2 ratio (ref <=5.0)
CHOLESTEROL: 176 mg/dL (ref 125–200)
HDL: 86 mg/dL (ref >=40)
LDL Cholesterol: 77 mg/dL (ref <130)
Triglycerides: 66 mg/dL (ref <150)
VLDL: 13 mg/dL (ref <30)

## 2015-02-09 LAB — HEMOGLOBIN A1C
Hgb A1c MFr Bld: 7.3 % — ABNORMAL HIGH (ref <5.7)
Mean Plasma Glucose: 163 mg/dL — ABNORMAL HIGH (ref <117)

## 2015-02-09 NOTE — Assessment & Plan Note (Signed)
Blood pressure well controlled to change her medication 

## 2015-02-09 NOTE — Assessment & Plan Note (Signed)
Goal of A1c less than 8% on then recheck his levels today he is currently on glipizide and Januvia metformin

## 2015-02-09 NOTE — Assessment & Plan Note (Signed)
In general doing well on Aricept will continue with current dose of medication

## 2015-02-09 NOTE — Progress Notes (Signed)
Patient ID: Ethan Davis, male   DOB: [birth year], 81 y.o.   MRN: [id number]   Subjective:    Patient ID: Ethan Davis, male    DOB: 1927-04-25, 81 y.o.   MRN: [id number]  Patient presents for 3 month F/U  Patient here to follow-up chronic medical problems. He is here today with his daughter. They have no particular concerns states he is doing well at home. At her last visit I was concerned about his weight but he has actually gained weight as he is now eating on a regular basis also drinking supplements. States that his blood sugars have been good. He did not bring his meter with him today. He has not had any falls no dizzy spells no chest pain or shortness of breath.   Review Of Systems:  GEN- denies fatigue, fever, weight loss,weakness, recent illness HEENT- denies eye drainage, change in vision, nasal discharge, CVS- denies chest pain, palpitations RESP- denies SOB, cough, wheeze ABD- denies N/V, change in stools, abd pain GU- denies dysuria, hematuria, dribbling, incontinence MSK- denies joint pain, muscle aches, injury Neuro- denies headache, dizziness, syncope, seizure activity       Objective:    BP 128/70 mmHg  Pulse 68  Temp(Src) 98.1 F (36.7 C) (Oral)  Resp 14  Ht  (1.803 m)  Wt 157 lb (71.215 kg)  BMI 21.91 kg/m2 GEN- NAD, alert and oriented x3 HEENT- PERRL, EOMI, non injected sclera, pink conjunctiva, MMM, oropharynx clear CVS- RRR, no murmur RESP-CTAB ABD-NABS,soft,NT,ND EXT- No edema Pulses- Radial  2+        Assessment & Plan:      Problem List Items Addressed This Visit    Mild cognitive impairment   Hyperlipidemia   Relevant Orders   Lipid panel   Essential hypertension, benign - Primary   Relevant Orders   CBC with Differential/Platelet   Comprehensive metabolic panel   Diabetes mellitus type II, controlled   Relevant Orders   Hemoglobin A1c    Other Visit Diagnoses    Need for prophylactic vaccination and inoculation against  influenza        Relevant Orders    Flu Vaccine QUAD 36+ mos PF IM (Fluarix & Fluzone Quad PF) (Completed)       Note: This dictation was prepared with Dragon dictation along with smaller phrase technology. Any transcriptional errors that result from this process are unintentional.

## 2015-02-09 NOTE — Patient Instructions (Signed)
Continue current medications Flu shot given F/U 4 months 

## 2015-04-03 DIAGNOSIS — Z23 Encounter for immunization: Secondary | ICD-10-CM | POA: Diagnosis not present

## 2015-06-12 ENCOUNTER — Ambulatory Visit: Payer: Medicare Other | Admitting: Family Medicine

## 2015-06-19 ENCOUNTER — Ambulatory Visit: Payer: Medicare Other | Admitting: Family Medicine

## 2015-06-22 ENCOUNTER — Ambulatory Visit: Payer: Medicare Other | Admitting: Family Medicine

## 2015-07-02 ENCOUNTER — Ambulatory Visit (INDEPENDENT_AMBULATORY_CARE_PROVIDER_SITE_OTHER): Payer: Medicare Other | Admitting: Family Medicine

## 2015-07-02 ENCOUNTER — Encounter: Payer: Self-pay | Admitting: Family Medicine

## 2015-07-02 VITALS — BP 120/72 | HR 74 | Temp 97.9°F | Resp 16 | Ht 71.0 in | Wt 152.0 lb

## 2015-07-02 DIAGNOSIS — E785 Hyperlipidemia, unspecified: Secondary | ICD-10-CM

## 2015-07-02 DIAGNOSIS — G3184 Mild cognitive impairment, so stated: Secondary | ICD-10-CM

## 2015-07-02 DIAGNOSIS — I10 Essential (primary) hypertension: Secondary | ICD-10-CM | POA: Diagnosis not present

## 2015-07-02 DIAGNOSIS — E119 Type 2 diabetes mellitus without complications: Secondary | ICD-10-CM | POA: Diagnosis not present

## 2015-07-02 LAB — COMPREHENSIVE METABOLIC PANEL
ALK PHOS: 64 U/L (ref 40–115)
ALT: 7 U/L — ABNORMAL LOW (ref 9–46)
AST: 14 U/L (ref 10–35)
Albumin: 4 g/dL (ref 3.6–5.1)
BUN: 16 mg/dL (ref 7–25)
CALCIUM: 9.4 mg/dL (ref 8.6–10.3)
CO2: 28 mmol/L (ref 20–31)
Chloride: 103 mmol/L (ref 98–110)
Creat: 0.94 mg/dL (ref 0.70–1.11)
GLUCOSE: 130 mg/dL — AB (ref 70–99)
Potassium: 4 mmol/L (ref 3.5–5.3)
Sodium: 141 mmol/L (ref 135–146)
Total Bilirubin: 0.3 mg/dL (ref 0.2–1.2)
Total Protein: 6.4 g/dL (ref 6.1–8.1)

## 2015-07-02 LAB — CBC WITH DIFFERENTIAL/PLATELET
BASOS PCT: 0 % (ref 0–1)
Basophils Absolute: 0 10*3/uL (ref 0.0–0.1)
EOS ABS: 0.1 10*3/uL (ref 0.0–0.7)
EOS PCT: 1 % (ref 0–5)
HCT: 36.4 % — ABNORMAL LOW (ref 39.0–52.0)
Hemoglobin: 12.1 g/dL — ABNORMAL LOW (ref 13.0–17.0)
Lymphocytes Relative: 33 % (ref 12–46)
Lymphs Abs: 1.9 10*3/uL (ref 0.7–4.0)
MCH: 30.9 pg (ref 26.0–34.0)
MCHC: 33.2 g/dL (ref 30.0–36.0)
MCV: 93.1 fL (ref 78.0–100.0)
MPV: 9.8 fL (ref 8.6–12.4)
Monocytes Absolute: 0.5 10*3/uL (ref 0.1–1.0)
Monocytes Relative: 8 % (ref 3–12)
NEUTROS PCT: 58 % (ref 43–77)
Neutro Abs: 3.4 10*3/uL (ref 1.7–7.7)
PLATELETS: 168 10*3/uL (ref 150–400)
RBC: 3.91 MIL/uL — ABNORMAL LOW (ref 4.22–5.81)
RDW: 13.8 % (ref 11.5–15.5)
WBC: 5.9 10*3/uL (ref 4.0–10.5)

## 2015-07-02 LAB — HEMOGLOBIN A1C
Hgb A1c MFr Bld: 7.7 % — ABNORMAL HIGH (ref <5.7)
Mean Plasma Glucose: 174 mg/dL — ABNORMAL HIGH (ref <117)

## 2015-07-02 NOTE — Assessment & Plan Note (Signed)
Diabetes has been well-controlled for his age. His last A1c 7.3%. We can keep him below 8% without any hypoglycemia this would be great. Continue current medications. He is on statin drug as well as ACE inhibitor.

## 2015-07-02 NOTE — Assessment & Plan Note (Signed)
Blood pressure is well-controlled and change her medication. 

## 2015-07-02 NOTE — Assessment & Plan Note (Signed)
LDL is at goal continue statin drug use tolerated this without any difficulty

## 2015-07-02 NOTE — Progress Notes (Signed)
Patient ID: Ethan Davis, male   DOB: 01-30-1927, 82 y.o.   MRN: [id number]    Subjective:    Patient ID: Ethan Davis, male    DOB: [birth year], 82 y.o.   MRN: [id number]  Patient presents for F/U Pt here to  follow-up medications. He has no particular concerns. He has not been monitoring his blood sugar on a regular basis he does not have his meter with him today but states that he feels well. He is still doing his daily activities he is still driving without any difficulty. He is here today with his daughter who is his power of attorney. She does not have any particular concerns. Medications were reviewed. We discussed if he had a living will with reference to end-of-life care. She states that this was done and she will get me a copy of this. I also gave them the MOST form to review    Review Of Systems:  GEN- denies fatigue, fever, weight loss,weakness, recent illness HEENT- denies eye drainage, change in vision, nasal discharge, CVS- denies chest pain, palpitations RESP- denies SOB, cough, wheeze ABD- denies N/V, change in stools, abd pain GU- denies dysuria, hematuria, dribbling, incontinence MSK- denies joint pain, muscle aches, injury Neuro- denies headache, dizziness, syncope, seizure activity       Objective:    BP 120/72 mmHg  Pulse 74  Temp(Src) 97.9 F (36.6 C) (Oral)  Resp 16  Ht  (1.803 m)  Wt 152 lb (68.947 kg)  BMI 21.21 kg/m2 GEN- NAD, alert and oriented x3 HEENT- PERRL, EOMI, non injected sclera, pink conjunctiva, MMM, oropharynx clear,arcus senilis CVS- RRR, no murmur RESP-CTAB ABD-NABS,soft,NT,ND EXT- No edema Pulses- Radial, DP- 2+        Assessment & Plan:      Problem List Items Addressed This Visit    Mild cognitive impairment   Hyperlipidemia   Essential hypertension, benign   Diabetes mellitus type II, controlled (HCC) - Primary   Relevant Orders   CBC with Differential/Platelet   Comprehensive metabolic panel   Hemoglobin A1c    HM DIABETES FOOT EXAM (Completed)      Note: This dictation was prepared with Dragon dictation along with smaller phrase technology. Any transcriptional errors that result from this process are unintentional.

## 2015-07-02 NOTE — Assessment & Plan Note (Signed)
He is doing well with the Aricept we will continue the 10 mg. I see no reason to add Namenda at this time.

## 2015-07-02 NOTE — Patient Instructions (Signed)
Continue current medications Review the MOST Form for end of life care F/U 4 months

## 2015-10-22 ENCOUNTER — Telehealth: Payer: Self-pay | Admitting: Internal Medicine

## 2015-10-22 NOTE — Telephone Encounter (Signed)
RECALL FOR TCS °

## 2015-10-23 NOTE — Telephone Encounter (Signed)
Letter mailed to pt.  

## 2015-10-29 ENCOUNTER — Ambulatory Visit (INDEPENDENT_AMBULATORY_CARE_PROVIDER_SITE_OTHER): Payer: Medicare Other | Admitting: Family Medicine

## 2015-10-29 ENCOUNTER — Encounter: Payer: Self-pay | Admitting: Family Medicine

## 2015-10-29 VITALS — BP 136/70 | HR 68 | Temp 98.0°F | Resp 14 | Ht 71.0 in | Wt 149.0 lb

## 2015-10-29 DIAGNOSIS — G3184 Mild cognitive impairment, so stated: Secondary | ICD-10-CM | POA: Diagnosis not present

## 2015-10-29 DIAGNOSIS — I10 Essential (primary) hypertension: Secondary | ICD-10-CM | POA: Diagnosis not present

## 2015-10-29 DIAGNOSIS — E785 Hyperlipidemia, unspecified: Secondary | ICD-10-CM | POA: Diagnosis not present

## 2015-10-29 DIAGNOSIS — E119 Type 2 diabetes mellitus without complications: Secondary | ICD-10-CM | POA: Diagnosis not present

## 2015-10-29 LAB — COMPREHENSIVE METABOLIC PANEL
ALK PHOS: 77 U/L (ref 40–115)
ALT: 6 U/L — AB (ref 9–46)
AST: 16 U/L (ref 10–35)
Albumin: 4.2 g/dL (ref 3.6–5.1)
BUN: 16 mg/dL (ref 7–25)
CHLORIDE: 99 mmol/L (ref 98–110)
CO2: 27 mmol/L (ref 20–31)
CREATININE: 1.03 mg/dL (ref 0.70–1.11)
Calcium: 9.5 mg/dL (ref 8.6–10.3)
GLUCOSE: 153 mg/dL — AB (ref 70–99)
POTASSIUM: 4.4 mmol/L (ref 3.5–5.3)
SODIUM: 137 mmol/L (ref 135–146)
Total Bilirubin: 0.5 mg/dL (ref 0.2–1.2)
Total Protein: 7 g/dL (ref 6.1–8.1)

## 2015-10-29 LAB — CBC WITH DIFFERENTIAL/PLATELET
BASOS PCT: 0 %
Basophils Absolute: 0 cells/uL (ref 0–200)
EOS ABS: 54 {cells}/uL (ref 15–500)
EOS PCT: 1 %
HCT: 38.2 % — ABNORMAL LOW (ref 38.5–50.0)
Hemoglobin: 12.3 g/dL — ABNORMAL LOW (ref 13.0–17.0)
LYMPHS PCT: 31 %
Lymphs Abs: 1674 cells/uL (ref 850–3900)
MCH: 30 pg (ref 27.0–33.0)
MCHC: 32.2 g/dL (ref 32.0–36.0)
MCV: 93.2 fL (ref 80.0–100.0)
MONOS PCT: 8 %
MPV: 10.5 fL (ref 7.5–12.5)
Monocytes Absolute: 432 cells/uL (ref 200–950)
NEUTROS ABS: 3240 {cells}/uL (ref 1500–7800)
Neutrophils Relative %: 60 %
PLATELETS: 159 10*3/uL (ref 140–400)
RBC: 4.1 MIL/uL — ABNORMAL LOW (ref 4.20–5.80)
RDW: 13.8 % (ref 11.0–15.0)
WBC: 5.4 10*3/uL (ref 3.8–10.8)

## 2015-10-29 LAB — LIPID PANEL
CHOL/HDL RATIO: 2.3 ratio (ref <=5.0)
Cholesterol: 213 mg/dL — ABNORMAL HIGH (ref 125–200)
HDL: 92 mg/dL (ref >=40)
LDL Cholesterol: 111 mg/dL (ref <130)
Triglycerides: 51 mg/dL (ref <150)
VLDL: 10 mg/dL (ref <30)

## 2015-10-29 NOTE — Patient Instructions (Signed)
F/U 4 months  

## 2015-10-29 NOTE — Progress Notes (Signed)
Patient ID: Ethan Davis, male   DOB: 1927-02-14, 82 y.o.   MRN: [id number]    Subjective:    Patient ID: Ethan ReinJohn I Davis, male    DOB: 1927-02-14, 82 y.o.   MRN: [id number]  Patient presents for 4 month F/U Patient follow-up chronic medical problems is no particular concerns. He is here today with his daughter. She does have his living will with his medical section she is can get that to me this week. Diabetes mellitus not bring his blood glucose monitor but states he has not had any problems taking this medication with his sugars.  Medications reviewed. He does not have the bottles with him. For the most part he is taking them daily as prescribed No falls no recent illnesses he does take a Claritin as needed for allergies  Review Of Systems:  GEN- denies fatigue, fever, weight loss,weakness, recent illness HEENT- denies eye drainage, change in vision, nasal discharge, CVS- denies chest pain, palpitations RESP- denies SOB, cough, wheeze ABD- denies N/V, change in stools, abd pain GU- denies dysuria, hematuria, dribbling, incontinence MSK- denies joint pain, muscle aches, injury Neuro- denies headache, dizziness, syncope, seizure activity       Objective:    BP 136/70 mmHg  Pulse 68  Temp(Src) 98 F (36.7 C) (Oral)  Resp 14  Ht 5\' 11"  (1.803 m)  Wt 149 lb (67.586 kg)  BMI 20.79 kg/m2 GEN- NAD, alert and oriented x3 HEENT- PERRL, EOMI, non injected sclera, pink conjunctiva, MMM, oropharynx clear  Neck- Supple, no thyromegaly CVS- RRR, no murmur RESP-CTAB EXT- No edema Pulses- Radial, DP- 2+        Assessment & Plan:      Problem List Items Addressed This Visit    Mild cognitive impairment    He is doing well at home with his wife he will continue the aricept      Hyperlipidemia   Relevant Orders   Lipid panel   Essential hypertension, benign    Blood pressure is controlled now change medication      Diabetes mellitus type II, controlled (HCC) - Primary   Diabetes has been controlled for his age his A1c's have been less than 8%. Recheck levels today he's doing quite well at almost 82 years of age were not changing his medications he is not having any hypoglycemic symptoms      Relevant Orders   CBC with Differential/Platelet   Comprehensive metabolic panel   Hemoglobin A1c      Note: This dictation was prepared with Dragon dictation along with smaller phrase technology. Any transcriptional errors that result from this process are unintentional.

## 2015-10-29 NOTE — Assessment & Plan Note (Signed)
Blood pressure is controlled now change medication

## 2015-10-29 NOTE — Assessment & Plan Note (Signed)
He is doing well at home with his wife he will continue the aricept

## 2015-10-29 NOTE — Assessment & Plan Note (Signed)
Diabetes has been controlled for his age his A1c's have been less than 8%. Recheck levels today he's doing quite well at almost 82 years of age were not changing his medications he is not having any hypoglycemic symptoms

## 2015-10-30 ENCOUNTER — Other Ambulatory Visit: Payer: Self-pay | Admitting: Family Medicine

## 2015-10-30 LAB — HEMOGLOBIN A1C
HEMOGLOBIN A1C: 8.1 % — AB (ref <5.7)
MEAN PLASMA GLUCOSE: 186 mg/dL

## 2015-10-30 MED ORDER — METFORMIN HCL 1000 MG PO TABS: 1000.0000 mg | ORAL_TABLET | Freq: Two times a day (BID) | ORAL | Status: DC

## 2015-10-30 MED ORDER — GLIPIZIDE 10 MG PO TABS: 5.0000 mg | ORAL_TABLET | Freq: Every day | ORAL | Status: DC

## 2015-10-30 MED ORDER — LISINOPRIL-HYDROCHLOROTHIAZIDE 10-12.5 MG PO TABS: 1.0000 | ORAL_TABLET | Freq: Every day | ORAL | Status: DC

## 2015-10-30 MED ORDER — DOXAZOSIN MESYLATE 4 MG PO TABS: ORAL_TABLET | ORAL | Status: DC

## 2015-10-30 MED ORDER — FINASTERIDE 5 MG PO TABS: ORAL_TABLET | ORAL | Status: DC

## 2015-10-30 NOTE — Telephone Encounter (Signed)
Medication refilled per protocol. 

## 2015-10-30 NOTE — Telephone Encounter (Signed)
Pt needs a refill of Metformin 1000 mg, Lisinopril, Finasteride 5 mg, Glipizide 10 mg, Doxazosin 4 mg [facility] mail order

## 2015-11-01 ENCOUNTER — Other Ambulatory Visit: Payer: Self-pay | Admitting: *Deleted

## 2015-11-01 MED ORDER — SITAGLIPTIN PHOSPHATE 25 MG PO TABS: ORAL_TABLET | ORAL | Status: DC

## 2016-01-02 ENCOUNTER — Other Ambulatory Visit: Payer: Self-pay | Admitting: Family Medicine

## 2016-01-02 NOTE — Telephone Encounter (Signed)
Refill appropriate and filled per protocol. 

## 2016-03-03 ENCOUNTER — Encounter: Payer: Self-pay | Admitting: Family Medicine

## 2016-03-03 ENCOUNTER — Ambulatory Visit (INDEPENDENT_AMBULATORY_CARE_PROVIDER_SITE_OTHER): Payer: Medicare Other | Admitting: Family Medicine

## 2016-03-03 VITALS — BP 122/68 | HR 62 | Temp 97.9°F | Resp 16 | Ht 71.0 in | Wt 147.0 lb

## 2016-03-03 DIAGNOSIS — G3184 Mild cognitive impairment, so stated: Secondary | ICD-10-CM | POA: Diagnosis not present

## 2016-03-03 DIAGNOSIS — G5691 Unspecified mononeuropathy of right upper limb: Secondary | ICD-10-CM | POA: Diagnosis not present

## 2016-03-03 DIAGNOSIS — Z23 Encounter for immunization: Secondary | ICD-10-CM | POA: Diagnosis not present

## 2016-03-03 DIAGNOSIS — N400 Enlarged prostate without lower urinary tract symptoms: Secondary | ICD-10-CM | POA: Diagnosis not present

## 2016-03-03 DIAGNOSIS — E785 Hyperlipidemia, unspecified: Secondary | ICD-10-CM

## 2016-03-03 DIAGNOSIS — E119 Type 2 diabetes mellitus without complications: Secondary | ICD-10-CM | POA: Diagnosis not present

## 2016-03-03 DIAGNOSIS — I10 Essential (primary) hypertension: Secondary | ICD-10-CM

## 2016-03-03 LAB — COMPLETE METABOLIC PANEL WITH GFR
ALT: 8 U/L — AB (ref 9–46)
AST: 16 U/L (ref 10–35)
Albumin: 4.2 g/dL (ref 3.6–5.1)
Alkaline Phosphatase: 77 U/L (ref 40–115)
BUN: 12 mg/dL (ref 7–25)
CHLORIDE: 102 mmol/L (ref 98–110)
CO2: 29 mmol/L (ref 20–31)
CREATININE: 1.01 mg/dL (ref 0.70–1.11)
Calcium: 9.8 mg/dL (ref 8.6–10.3)
GFR, EST AFRICAN AMERICAN: 77 mL/min (ref >=60)
GFR, EST NON AFRICAN AMERICAN: 67 mL/min (ref >=60)
GLUCOSE: 195 mg/dL — AB (ref 70–99)
Potassium: 4.5 mmol/L (ref 3.5–5.3)
SODIUM: 139 mmol/L (ref 135–146)
Total Bilirubin: 0.6 mg/dL (ref 0.2–1.2)
Total Protein: 7 g/dL (ref 6.1–8.1)

## 2016-03-03 LAB — CBC WITH DIFFERENTIAL/PLATELET
BASOS PCT: 1 %
Basophils Absolute: 66 cells/uL (ref 0–200)
EOS PCT: 1 %
Eosinophils Absolute: 66 cells/uL (ref 15–500)
HCT: 38.7 % (ref 38.5–50.0)
Hemoglobin: 13.2 g/dL (ref 13.0–17.0)
LYMPHS PCT: 30 %
Lymphs Abs: 1980 cells/uL (ref 850–3900)
MCH: 30.7 pg (ref 27.0–33.0)
MCHC: 34.1 g/dL (ref 32.0–36.0)
MCV: 90 fL (ref 80.0–100.0)
MONOS PCT: 9 %
MPV: 10.2 fL (ref 7.5–12.5)
Monocytes Absolute: 594 cells/uL (ref 200–950)
Neutro Abs: 3894 cells/uL (ref 1500–7800)
Neutrophils Relative %: 59 %
PLATELETS: 162 10*3/uL (ref 140–400)
RBC: 4.3 MIL/uL (ref 4.20–5.80)
RDW: 12.8 % (ref 11.0–15.0)
WBC: 6.6 10*3/uL (ref 3.8–10.8)

## 2016-03-03 MED ORDER — SIMVASTATIN 20 MG PO TABS: 20.0000 mg | ORAL_TABLET | Freq: Every day | ORAL | 3 refills | Status: DC

## 2016-03-03 MED ORDER — SITAGLIPTIN PHOSPHATE 25 MG PO TABS: ORAL_TABLET | ORAL | 3 refills | Status: DC

## 2016-03-03 MED ORDER — GLIPIZIDE 10 MG PO TABS: 5.0000 mg | ORAL_TABLET | Freq: Every day | ORAL | 3 refills | Status: DC

## 2016-03-03 MED ORDER — FINASTERIDE 5 MG PO TABS: ORAL_TABLET | ORAL | 3 refills | Status: DC

## 2016-03-03 MED ORDER — METFORMIN HCL 1000 MG PO TABS: 1000.0000 mg | ORAL_TABLET | Freq: Two times a day (BID) | ORAL | 3 refills | Status: DC

## 2016-03-03 MED ORDER — DOXAZOSIN MESYLATE 4 MG PO TABS: ORAL_TABLET | ORAL | 3 refills | Status: DC

## 2016-03-03 MED ORDER — LISINOPRIL-HYDROCHLOROTHIAZIDE 10-12.5 MG PO TABS: 1.0000 | ORAL_TABLET | Freq: Every day | ORAL | 3 refills | Status: DC

## 2016-03-03 MED ORDER — DONEPEZIL HCL 10 MG PO TABS
ORAL_TABLET | ORAL | 3 refills | Status: DC
Start: 2016-03-03 — End: 2017-07-28

## 2016-03-03 NOTE — Assessment & Plan Note (Signed)
Restart zocor no problems with meds

## 2016-03-03 NOTE — Assessment & Plan Note (Signed)
Well controlled 

## 2016-03-03 NOTE — Progress Notes (Signed)
Subjective:    Patient ID: Ethan Davis, male    DOB: [birth year], 82 y.o.   MRN: [id number]  Patient presents for 4 month F/U (is fasting)  DM- last A1C 8.1% , does not check his CBG regulary, daughters provide meals and medications to his box. He has had numbness in right fingertips for a few months, does not impede his regular activites. No medications taken, no pain in hands  Medications all due, out of statin and his aricept which is working well for his memory  Maintaining his weight, has good appetite now   Due for flu shot    Review Of Systems:  GEN- denies fatigue, fever, weight loss,weakness, recent illness HEENT- denies eye drainage, change in vision, nasal discharge, CVS- denies chest pain, palpitations RESP- denies SOB, cough, wheeze ABD- denies N/V, change in stools, abd pain GU- denies dysuria, hematuria, dribbling, incontinence MSK- denies joint pain, muscle aches, injury Neuro- denies headache, dizziness, syncope, seizure activity       Objective:    BP 122/68 (BP Location: Right Arm, Patient Position: Sitting, Cuff Size: Normal)   Pulse 62   Temp 97.9 F (36.6 C) (Oral)   Resp 16   Ht 5\' 11"  (1.803 m)   Wt 147 lb (66.7 kg)   BMI 20.50 kg/m  GEN- NAD, alert and oriented x3 HEENT- PERRL, EOMI, non injected sclera, pink conjunctiva, MMM, oropharynx clear,arcus senilis Neck- Supple, decreased ROM ,neg spurlings  CVS- RRR, no murmur RESP-CTAB Neuro- normal tone UE, strength equal bilat, grasp equal bilat, decreased monofilament thumb-4th digit right hand at tips of fingers EXT- No edema Pulses- Radial 2+        Assessment & Plan:      Problem List Items Addressed This Visit    Mild cognitive impairment   Hyperlipidemia - Primary    Restart zocor no problems with meds      Relevant Medications   simvastatin (ZOCOR) 20 MG tablet   lisinopril-hydrochlorothiazide (PRINZIDE,ZESTORETIC) 10-12.5 MG tablet   doxazosin (CARDURA) 4 MG tablet   Essential hypertension, benign    Well controlled       Relevant Medications   simvastatin (ZOCOR) 20 MG tablet   lisinopril-hydrochlorothiazide (PRINZIDE,ZESTORETIC) 10-12.5 MG tablet   doxazosin (CARDURA) 4 MG tablet   Other Relevant Orders   COMPLETE METABOLIC PANEL WITH GFR   CBC with Differential/Platelet   Diabetes mellitus type II, controlled (HCC)    Diabetes has had fair control for age, less than 8 is normal  Recheck A1C and fasting labs       Relevant Medications   sitaGLIPtin (JANUVIA) 25 MG tablet   simvastatin (ZOCOR) 20 MG tablet   metFORMIN (GLUCOPHAGE) 1000 MG tablet   lisinopril-hydrochlorothiazide (PRINZIDE,ZESTORETIC) 10-12.5 MG tablet   glipiZIDE (GLUCOTROL) 10 MG tablet   Other Relevant Orders   Hemoglobin A1c   BPH (benign prostatic hypertrophy)   Relevant Medications   finasteride (PROSCAR) 5 MG tablet    Other Visit Diagnoses    Neuropathy of hand, right       carpal tunnel on differential vs other neuropathy not classic of diabetic neuropathy no injury, at this time, not interfere with activitesi, he is left handed, with his age discussed treatments which would probably cause more harm with meds that may be sedative, brace would interfere with ADLS so will just monitor for now, no surgical intervention even if needed   Note MOST FORM AND HPOA also on file    Relevant Medications  donepezil (ARICEPT) 10 MG tablet      Note: This dictation was prepared with Dragon dictation along with smaller phrase technology. Any transcriptional errors that result from this process are unintentional.

## 2016-03-03 NOTE — Addendum Note (Signed)
Addended by: Phillips OdorSIX, Alexanderia Gorby H on: 03/03/2016 09:54 AM   Modules accepted: Orders

## 2016-03-03 NOTE — Patient Instructions (Signed)
No change to medications Flu shot given F/U 4 Months

## 2016-03-03 NOTE — Assessment & Plan Note (Signed)
Diabetes has had fair control for age, less than 8 is normal  Recheck A1C and fasting labs

## 2016-03-04 LAB — HEMOGLOBIN A1C
Hgb A1c MFr Bld: 8.3 % — ABNORMAL HIGH (ref <5.7)
MEAN PLASMA GLUCOSE: 192 mg/dL

## 2016-03-06 ENCOUNTER — Encounter: Payer: Self-pay | Admitting: *Deleted

## 2016-07-04 ENCOUNTER — Encounter: Payer: Self-pay | Admitting: Family Medicine

## 2016-07-04 ENCOUNTER — Ambulatory Visit (INDEPENDENT_AMBULATORY_CARE_PROVIDER_SITE_OTHER): Payer: Medicare Other | Admitting: Family Medicine

## 2016-07-04 VITALS — BP 128/72 | HR 66 | Temp 98.1°F | Resp 14 | Ht 71.0 in | Wt 146.0 lb

## 2016-07-04 DIAGNOSIS — I10 Essential (primary) hypertension: Secondary | ICD-10-CM

## 2016-07-04 DIAGNOSIS — E119 Type 2 diabetes mellitus without complications: Secondary | ICD-10-CM | POA: Diagnosis not present

## 2016-07-04 DIAGNOSIS — E7800 Pure hypercholesterolemia, unspecified: Secondary | ICD-10-CM | POA: Diagnosis not present

## 2016-07-04 DIAGNOSIS — G3184 Mild cognitive impairment, so stated: Secondary | ICD-10-CM

## 2016-07-04 NOTE — Assessment & Plan Note (Signed)
On statin drug 

## 2016-07-04 NOTE — Progress Notes (Signed)
   Subjective:    Patient ID: Ethan Davis, male    DOB: 1927/01/08, 83 y.o.   MRN: [id number]  Patient presents for 4 month F/U (is fasting)  Pt here to f/u DM   Medications reviewed  Last visit restarted zocor for his cholesterol  HTN- tolerating BP medications, no side effects Diabetes- currently on metformin and Januvia , last A1C 8.3%, he is not taking his blood sugars regulary but takes his meds States appetite is good   Had URI a few weeks ago, now improved, gets hoarse in the morning but this is chronic    Review Of Systems:  GEN- denies fatigue, fever, weight loss,weakness, recent illness HEENT- denies eye drainage, change in vision, nasal discharge, CVS- denies chest pain, palpitations RESP- denies SOB, cough, wheeze ABD- denies N/V, change in stools, abd pain GU- denies dysuria, hematuria, dribbling, incontinence MSK- denies joint pain, muscle aches, injury Neuro- denies headache, dizziness, syncope, seizure activity       Objective:    BP 128/72 (BP Location: Right Arm, Patient Position: Sitting, Cuff Size: Normal)   Pulse 66   Temp 98.1 F (36.7 C) (Oral)   Resp 14   Ht 5\' 11"  (1.803 m)   Wt 146 lb (66.2 kg)   SpO2 98%   BMI 20.36 kg/m  GEN- NAD, alert and oriented x3 HEENT- PERRL, EOMI, non injected sclera, pink conjunctiva, MMM, oropharynx clear,arcus senilus CVS- RRR, no murmur RESP-CTAB ABD-NABS,soft,NT,ND EXT- No edema Pulses- Radial 2+        Assessment & Plan:      Problem List Items Addressed This Visit    Mild cognitive impairment   Hyperlipidemia    On statin drug       Essential hypertension, benign - Primary    Blood pressure controlled He is actually doing quite well for his age and medical problems Has care by his children and has common law wife      Relevant Orders   Basic metabolic panel   Diabetes mellitus type II, controlled (HCC)    Goal A1C around 8% based on age, and to keep him assymptomatic Check renal  function and A1C       Relevant Orders   Hemoglobin A1c      Note: This dictation was prepared with Dragon dictation along with smaller phrase technology. Any transcriptional errors that result from this process are unintentional.

## 2016-07-04 NOTE — Assessment & Plan Note (Signed)
Blood pressure controlled He is actually doing quite well for his age and medical problems Has care by his children and has common law wife

## 2016-07-04 NOTE — Assessment & Plan Note (Signed)
Goal A1C around 8% based on age, and to keep him assymptomatic Check renal function and A1C

## 2016-07-04 NOTE — Patient Instructions (Signed)
F/U July for recheck

## 2016-07-05 LAB — BASIC METABOLIC PANEL
BUN: 17 mg/dL (ref 7–25)
CO2: 25 mmol/L (ref 20–31)
Calcium: 9.5 mg/dL (ref 8.6–10.3)
Chloride: 100 mmol/L (ref 98–110)
Creat: 0.98 mg/dL (ref 0.70–1.11)
GLUCOSE: 259 mg/dL — AB (ref 70–99)
Potassium: 4.5 mmol/L (ref 3.5–5.3)
SODIUM: 138 mmol/L (ref 135–146)

## 2016-07-05 LAB — HEMOGLOBIN A1C
HEMOGLOBIN A1C: 11.6 % — AB (ref <5.7)
MEAN PLASMA GLUCOSE: 286 mg/dL

## 2016-12-15 ENCOUNTER — Ambulatory Visit: Payer: Self-pay | Admitting: Family Medicine

## 2016-12-22 ENCOUNTER — Ambulatory Visit (INDEPENDENT_AMBULATORY_CARE_PROVIDER_SITE_OTHER): Payer: Medicare Other | Admitting: Family Medicine

## 2016-12-22 ENCOUNTER — Encounter: Payer: Self-pay | Admitting: Family Medicine

## 2016-12-22 VITALS — BP 130/64 | HR 74 | Temp 97.9°F | Resp 16 | Ht 71.0 in | Wt 147.0 lb

## 2016-12-22 DIAGNOSIS — E119 Type 2 diabetes mellitus without complications: Secondary | ICD-10-CM

## 2016-12-22 DIAGNOSIS — I10 Essential (primary) hypertension: Secondary | ICD-10-CM

## 2016-12-22 DIAGNOSIS — E7800 Pure hypercholesterolemia, unspecified: Secondary | ICD-10-CM

## 2016-12-22 DIAGNOSIS — G3184 Mild cognitive impairment, so stated: Secondary | ICD-10-CM

## 2016-12-22 LAB — CBC WITH DIFFERENTIAL/PLATELET
BASOS ABS: 0 {cells}/uL (ref 0–200)
Basophils Relative: 0 %
EOS PCT: 1 %
Eosinophils Absolute: 57 cells/uL (ref 15–500)
HEMATOCRIT: 39.8 % (ref 38.5–50.0)
HEMOGLOBIN: 12.9 g/dL — AB (ref 13.0–17.0)
LYMPHS ABS: 2052 {cells}/uL (ref 850–3900)
Lymphocytes Relative: 36 %
MCH: 30.3 pg (ref 27.0–33.0)
MCHC: 32.4 g/dL (ref 32.0–36.0)
MCV: 93.4 fL (ref 80.0–100.0)
MONO ABS: 513 {cells}/uL (ref 200–950)
MPV: 9.9 fL (ref 7.5–12.5)
Monocytes Relative: 9 %
NEUTROS ABS: 3078 {cells}/uL (ref 1500–7800)
Neutrophils Relative %: 54 %
Platelets: 141 10*3/uL (ref 140–400)
RBC: 4.26 MIL/uL (ref 4.20–5.80)
RDW: 13.5 % (ref 11.0–15.0)
WBC: 5.7 10*3/uL (ref 3.8–10.8)

## 2016-12-22 LAB — LIPID PANEL
Cholesterol: 217 mg/dL — ABNORMAL HIGH (ref <200)
HDL: 83 mg/dL (ref >40)
LDL Cholesterol: 123 mg/dL — ABNORMAL HIGH (ref <100)
Total CHOL/HDL Ratio: 2.6 Ratio (ref <5.0)
Triglycerides: 53 mg/dL (ref <150)
VLDL: 11 mg/dL (ref <30)

## 2016-12-22 LAB — COMPREHENSIVE METABOLIC PANEL
ALBUMIN: 3.9 g/dL (ref 3.6–5.1)
ALK PHOS: 84 U/L (ref 40–115)
ALT: 7 U/L — ABNORMAL LOW (ref 9–46)
AST: 14 U/L (ref 10–35)
BILIRUBIN TOTAL: 0.6 mg/dL (ref 0.2–1.2)
BUN: 16 mg/dL (ref 7–25)
CO2: 28 mmol/L (ref 20–31)
CREATININE: 1.16 mg/dL — AB (ref 0.70–1.11)
Calcium: 9.4 mg/dL (ref 8.6–10.3)
Chloride: 103 mmol/L (ref 98–110)
Glucose, Bld: 200 mg/dL — ABNORMAL HIGH (ref 70–99)
Potassium: 4.3 mmol/L (ref 3.5–5.3)
SODIUM: 139 mmol/L (ref 135–146)
TOTAL PROTEIN: 6.6 g/dL (ref 6.1–8.1)

## 2016-12-22 NOTE — Assessment & Plan Note (Addendum)
Diabetes has been uncontrolled in the past secondary to inconsistency with his medications. His daughter does check his medication box states that he has been taking fairly regularly he does still miss some. We'll recheck his A1c today along with his renal function. I would like to keep his A1c around 8%

## 2016-12-22 NOTE — Progress Notes (Signed)
   Subjective:    Patient ID: Ethan Davis, male    DOB: 1927/04/03, 83 y.o.   MRN: [id number]  Patient presents for 6 month F/U (is fasting)  Patient here to follow chronic medical problems. Including diabetes and blood pressure. He is here today with his daughter. He's taken his medications as prescribed. He is maintaining his weight and his appetite for the past year.  Diabetes mellitus - his last A1c was 11.6 in January 2018 % he was restarted back on 1 full tablet of glipizide and continue on his Januvia prior to that his A1c was at 8.3 and 8.1 with his age to goals to keep him around 8% and to prevent hypoglycemia episodes. Consistency with his medications has been a problem in the past. He did not bring his meds to visit, did not bring any glucose readings   No concerns today, feels well  Review Of Systems:  GEN- denies fatigue, fever, weight loss,weakness, recent illness HEENT- denies eye drainage, change in vision, nasal discharge, CVS- denies chest pain, palpitations RESP- denies SOB, cough, wheeze ABD- denies N/V, change in stools, abd pain GU- denies dysuria, hematuria, dribbling, incontinence MSK- denies joint pain, muscle aches, injury Neuro- denies headache, dizziness, syncope, seizure activity       Objective:    BP 130/64   Pulse 74   Temp 97.9 F (36.6 C) (Oral)   Resp 16   Ht 5\' 11"  (1.803 m)   Wt 147 lb (66.7 kg)   SpO2 98%   BMI 20.50 kg/m  GEN- NAD, alert and oriented x3 HEENT- PERRL, EOMI, non injected sclera, pink conjunctiva, MMM, oropharynx clear,arcus senilus CVS- RRR, no murmur RESP-CTAB ABD-NABS,soft,NT,ND EXT- No edema Pulses- Radial 2+      Assessment & Plan:      Problem List Items Addressed This Visit    Hyperlipidemia   Relevant Orders   Lipid panel   Mild cognitive impairment    In general doing well at home he has not had any significant decline reported by his daughters. He is still driving during the day he still managing  his day-to-day finances. His family does help with meal preparation.      Essential hypertension, benign - Primary    Pressure-controlled no changes to medication he has not had any side effects.      Relevant Orders   CBC with Differential/Platelet   Comprehensive metabolic panel   Diabetes mellitus type II, controlled (HCC)    Diabetes has been uncontrolled in the past secondary to inconsistency with his medications. His daughter does check his medication box states that he has been taking fairly regularly he does still miss some. We'll recheck his A1c today along with his renal function. I would like to keep his A1c around 8%      Relevant Orders   Hemoglobin A1c      Note: This dictation was prepared with Dragon dictation along with smaller phrase technology. Any transcriptional errors that result from this process are unintentional.

## 2016-12-22 NOTE — Patient Instructions (Signed)
F/U [facility]

## 2016-12-22 NOTE — Assessment & Plan Note (Addendum)
In general doing well at home he has not had any significant decline reported by his daughters. He is still driving during the day he still managing his day-to-day finances. His family does help with meal preparation. Continue with Aricept

## 2016-12-22 NOTE — Assessment & Plan Note (Signed)
Pressure-controlled no changes to medication he has not had any side effects.

## 2016-12-23 LAB — HEMOGLOBIN A1C
HEMOGLOBIN A1C: 11.2 % — AB (ref <5.7)
MEAN PLASMA GLUCOSE: 275 mg/dL

## 2017-04-06 DIAGNOSIS — Z23 Encounter for immunization: Secondary | ICD-10-CM | POA: Diagnosis not present

## 2017-05-27 ENCOUNTER — Other Ambulatory Visit: Payer: Self-pay

## 2017-05-27 ENCOUNTER — Encounter: Payer: Self-pay | Admitting: Family Medicine

## 2017-05-27 ENCOUNTER — Ambulatory Visit (INDEPENDENT_AMBULATORY_CARE_PROVIDER_SITE_OTHER): Payer: Medicare Other | Admitting: Family Medicine

## 2017-05-27 VITALS — BP 128/78 | HR 72 | Temp 98.1°F | Resp 14 | Ht 71.0 in | Wt 150.0 lb

## 2017-05-27 DIAGNOSIS — G3184 Mild cognitive impairment, so stated: Secondary | ICD-10-CM

## 2017-05-27 DIAGNOSIS — Z23 Encounter for immunization: Secondary | ICD-10-CM | POA: Diagnosis not present

## 2017-05-27 DIAGNOSIS — Q845 Enlarged and hypertrophic nails: Secondary | ICD-10-CM

## 2017-05-27 DIAGNOSIS — E7800 Pure hypercholesterolemia, unspecified: Secondary | ICD-10-CM

## 2017-05-27 DIAGNOSIS — Z0000 Encounter for general adult medical examination without abnormal findings: Secondary | ICD-10-CM | POA: Diagnosis not present

## 2017-05-27 DIAGNOSIS — I10 Essential (primary) hypertension: Secondary | ICD-10-CM

## 2017-05-27 DIAGNOSIS — E119 Type 2 diabetes mellitus without complications: Secondary | ICD-10-CM | POA: Diagnosis not present

## 2017-05-27 DIAGNOSIS — E1165 Type 2 diabetes mellitus with hyperglycemia: Secondary | ICD-10-CM

## 2017-05-27 DIAGNOSIS — IMO0001 Reserved for inherently not codable concepts without codable children: Secondary | ICD-10-CM

## 2017-05-27 NOTE — Patient Instructions (Addendum)
F/U 4 months   Take medications consistently  Referral to podiatry

## 2017-05-27 NOTE — Progress Notes (Signed)
Subjective:   Patient presents for Medicare Annual/Subsequent preventive examination.   Daughter states that he does have his medications and his weekly box however he he takes them randomly.  He does not check his blood sugars either.  His last A1c was 11.2%.  He feels well has not had any falls is eating regularly bowels and bladder no concerns.  He does have underlying mild dementia he is on Aricept but also takes his medication randomly.  The sister check in on him they prepare the meals grocery shop  Review Past Medical/Family/Social: Per EMR    Risk Factors  Current exercise habits: walks some  Dietary issues discussed:none    Cardiac risk factors: DM, HTN  Depression Screen  (Note: if answer to either of the following is "Yes", a more complete depression screening is indicated)  Over the past two weeks, have you felt down, depressed or hopeless? No Over the past two weeks, have you felt little interest or pleasure in doing things? No Have you lost interest or pleasure in daily life? No Do you often feel hopeless? No Do you cry easily over simple problems? No   Activities of Daily Living  In your present state of health, do you have any difficulty performing the following activities?:  Driving?Yes Managing money? Yes  Feeding yourself? No  Getting from bed to chair? No  Climbing a flight of stairs? No  Preparing food and eating?: No  Bathing or showering? No  Getting dressed: No  Getting to the toilet? No  Using the toilet:No  Moving around from place to place: No  In the past year have you fallen or had a near fall?:No  Are you sexually active? No  Do you have more than one partner? No   Hearing Difficulties: No  Do you often ask people to speak up or repeat themselves? No  Do you experience ringing or noises in your ears? No Do you have difficulty understanding soft or whispered voices? No  Do you feel that you have a problem with memory?Yes Do you often misplace  items? Yes Do you feel safe at home? Yes  Cognitive Testing  Alert? Yes Normal Appearance?Yes  Oriented to person? Yes Place? Yes  Time? Yes  Recall of three objects? Yes  Can perform simple calculations? Yes  Displays appropriate judgment?Yes  Can read the correct time from a watch face?Yes   List the Names of Other Physician/Practitioners you currently use:  None Screening Tests / Date AGED OUT Colonoscopy Colonoscopy                     Shingles- Declines TDAP- DUe Pneumonia UTD Flu shot UTD   ROS: GEN- denies fatigue, fever, weight loss,weakness, recent illness HEENT- denies eye drainage, change in vision, nasal discharge, CVS- denies chest pain, palpitations RESP- denies SOB, cough, wheeze ABD- denies N/V, change in stools, abd pain GU- denies dysuria, hematuria, dribbling, incontinence MSK- denies joint pain, muscle aches, injury Neuro- denies headache, dizziness, syncope, seizure activity  Physical: GEN- NAD, alert and oriented x3 HEENT- PERRL, EOMI, arcus senilus, non injected sclera, pink conjunctiva, MMM, oropharynx clear Neck- Supple, no thryomegaly CVS- RRR, no murmur RESP-CTAB ABD-NABS,soft,NT,ND EXT- No edema Pulses- Radial, DP- 2+     Assessment:    Annual wellness medicare exam   Plan:    During the course of the visit the patient was educated and counseled about appropriate screening and preventive services including:  tdap GIVEN  Screen nEG for  depression.  Diabetes mellitus uncontrolled however he is not consistently taking his medications also does not check his blood sugars.  I would be very wary of putting him on insulin as I do not think he would perform the injections regularly and he does not monitor his blood sugars at his age.  He seems to be doing well in the setting of his diabetes being elevated.  We will recheck his labs today.  We will call the daughter with instructions.  Hypertension his blood pressure is controlled  He has  declined he has mild dementia early stages he is has Aricept but unclear how often he takes it.  We did discuss possible re-bubble packing his medications though they already have them in the trays for each week he just does not take them.  Daughter to try to remind him daily take medications but sometimes he states he just does not want to take them.  Faylene [employer] directives on file Diet review for nutrition referral? Yes ____ Not Indicated __x__  Patient Instructions (the written plan) was given to the patient.  Medicare Attestation  I have personally reviewed:  The patient's medical and social history  Their use of alcohol, tobacco or illicit drugs  Their current medications and supplements  The patient's functional ability including ADLs,fall risks, home safety risks, cognitive, and hearing and visual impairment  Diet and physical activities  Evidence for depression or mood disorders  The patient's weight, height, BMI, and visual acuity have been recorded in the chart. I have made referrals, counseling, and provided education to the patient based on review of the above and I have provided the patient with a written personalized care plan for preventive services.

## 2017-05-27 NOTE — Addendum Note (Signed)
Addended by: Phillips OdorSIX, Jenya Putz H on: 05/27/2017 10:42 AM   Modules accepted: Orders

## 2017-05-28 LAB — HEMOGLOBIN A1C
HEMOGLOBIN A1C: 12.2 %{Hb} — AB (ref <5.7)
Mean Plasma Glucose: 303 (calc)
eAG (mmol/L): 16.8 (calc)

## 2017-05-28 LAB — COMPREHENSIVE METABOLIC PANEL
AG RATIO: 1.4 (calc) (ref 1.0–2.5)
ALT: 6 U/L — AB (ref 9–46)
AST: 11 U/L (ref 10–35)
Albumin: 3.8 g/dL (ref 3.6–5.1)
Alkaline phosphatase (APISO): 90 U/L (ref 40–115)
BUN / CREAT RATIO: 19 (calc) (ref 6–22)
BUN: 21 mg/dL (ref 7–25)
CALCIUM: 9.4 mg/dL (ref 8.6–10.3)
CO2: 30 mmol/L (ref 20–32)
Chloride: 101 mmol/L (ref 98–110)
Creat: 1.12 mg/dL — ABNORMAL HIGH (ref 0.70–1.11)
GLUCOSE: 150 mg/dL — AB (ref 65–99)
Globulin: 2.7 g/dL (calc) (ref 1.9–3.7)
Potassium: 4.3 mmol/L (ref 3.5–5.3)
Sodium: 138 mmol/L (ref 135–146)
Total Bilirubin: 0.5 mg/dL (ref 0.2–1.2)
Total Protein: 6.5 g/dL (ref 6.1–8.1)

## 2017-05-28 LAB — LIPID PANEL
Cholesterol: 205 mg/dL — ABNORMAL HIGH (ref <200)
HDL: 89 mg/dL (ref >40)
LDL Cholesterol (Calc): 100 mg/dL (calc) — ABNORMAL HIGH
NON-HDL CHOLESTEROL (CALC): 116 mg/dL (ref <130)
TRIGLYCERIDES: 71 mg/dL (ref <150)
Total CHOL/HDL Ratio: 2.3 (calc) (ref <5.0)

## 2017-05-28 LAB — CBC WITH DIFFERENTIAL/PLATELET
BASOS PCT: 0.5 %
Basophils Absolute: 33 cells/uL (ref 0–200)
EOS ABS: 119 {cells}/uL (ref 15–500)
Eosinophils Relative: 1.8 %
HEMATOCRIT: 37.9 % — AB (ref 38.5–50.0)
Hemoglobin: 12.6 g/dL — ABNORMAL LOW (ref 13.2–17.1)
Lymphs Abs: 2086 cells/uL (ref 850–3900)
MCH: 30 pg (ref 27.0–33.0)
MCHC: 33.2 g/dL (ref 32.0–36.0)
MCV: 90.2 fL (ref 80.0–100.0)
MPV: 11.3 fL (ref 7.5–12.5)
Monocytes Relative: 8.8 %
NEUTROS ABS: 3782 {cells}/uL (ref 1500–7800)
Neutrophils Relative %: 57.3 %
Platelets: 120 10*3/uL — ABNORMAL LOW (ref 140–400)
RBC: 4.2 10*6/uL (ref 4.20–5.80)
RDW: 12.4 % (ref 11.0–15.0)
Total Lymphocyte: 31.6 %
WBC: 6.6 10*3/uL (ref 3.8–10.8)
WBCMIX: 581 {cells}/uL (ref 200–950)

## 2017-06-12 ENCOUNTER — Ambulatory Visit (INDEPENDENT_AMBULATORY_CARE_PROVIDER_SITE_OTHER): Payer: Medicare Other | Admitting: Podiatry

## 2017-06-12 ENCOUNTER — Encounter: Payer: Self-pay | Admitting: Podiatry

## 2017-06-12 DIAGNOSIS — E1140 Type 2 diabetes mellitus with diabetic neuropathy, unspecified: Secondary | ICD-10-CM

## 2017-06-12 DIAGNOSIS — M79675 Pain in left toe(s): Secondary | ICD-10-CM | POA: Diagnosis not present

## 2017-06-12 DIAGNOSIS — E1149 Type 2 diabetes mellitus with other diabetic neurological complication: Secondary | ICD-10-CM

## 2017-06-12 DIAGNOSIS — M79674 Pain in right toe(s): Secondary | ICD-10-CM | POA: Diagnosis not present

## 2017-06-12 DIAGNOSIS — B351 Tinea unguium: Secondary | ICD-10-CM | POA: Diagnosis not present

## 2017-06-12 NOTE — Progress Notes (Signed)
   Subjective:    Patient ID: Ethan Davis, male    DOB: 09/07/1926, 84 y.o.   MRN: [id number]  HPI    Review of Systems  All other systems reviewed and are negative.      Objective:   Physical Exam        Assessment & Plan:

## 2017-06-12 NOTE — Progress Notes (Signed)
Subjective:   Patient ID: Ethan Davis, male   DOB: 84 y.o.   MRN: [id number]   HPI Patient presents with elongated thickened nails 1-5 both feet that make it hard to wear shoe gear comfortably.  He is a diabetic and presents with his caregiver and has a current A1c of 11.  Patient does not smoke currently and likes to try to be active despite his age   Review of Systems  All other systems reviewed and are negative.       Objective:  Physical Exam  Constitutional: He appears well-developed and well-nourished.  Cardiovascular: Intact distal pulses.  Pulmonary/Chest: Effort normal.  Musculoskeletal: Normal range of motion.  Neurological: He is alert.  Skin: Skin is warm.  Nursing note and vitals reviewed.   Neurovascular status mildly diminished but intact with diminished digital perfusion noted but intact.  Patient was noted to have diminished hair growth bilateral and shiny tight skin but no indications of claudication symptoms when questions and was noted to have thick yellow brittle nailbeds 1-5 both feet that are elongated and painful when pressed     Assessment:  Mild risk patient with mycotic elongated nailbeds 1-5 both feet with pain     Plan:  H&P condition reviewed and debridement of nailbeds 1-5 both feet accomplished with no iatrogenic bleeding noted.  Reappoint for us to recheck in 3 months or earlier if needed

## 2017-06-17 ENCOUNTER — Ambulatory Visit (INDEPENDENT_AMBULATORY_CARE_PROVIDER_SITE_OTHER): Payer: Medicare Other | Admitting: Family Medicine

## 2017-06-17 ENCOUNTER — Other Ambulatory Visit: Payer: Self-pay

## 2017-06-17 ENCOUNTER — Encounter: Payer: Self-pay | Admitting: Family Medicine

## 2017-06-17 VITALS — BP 126/70 | HR 92 | Temp 97.7°F | Resp 18 | Ht 71.0 in | Wt 138.0 lb

## 2017-06-17 DIAGNOSIS — R634 Abnormal weight loss: Secondary | ICD-10-CM | POA: Diagnosis not present

## 2017-06-17 DIAGNOSIS — R6881 Early satiety: Secondary | ICD-10-CM

## 2017-06-17 DIAGNOSIS — E11649 Type 2 diabetes mellitus with hypoglycemia without coma: Secondary | ICD-10-CM

## 2017-06-17 LAB — COMPREHENSIVE METABOLIC PANEL
AG Ratio: 1.6 (calc) (ref 1.0–2.5)
ALT: 10 U/L (ref 9–46)
AST: 17 U/L (ref 10–35)
Albumin: 4.4 g/dL (ref 3.6–5.1)
Alkaline phosphatase (APISO): 80 U/L (ref 40–115)
BUN / CREAT RATIO: 23 (calc) — AB (ref 6–22)
BUN: 33 mg/dL — ABNORMAL HIGH (ref 7–25)
CO2: 26 mmol/L (ref 20–32)
Calcium: 9.8 mg/dL (ref 8.6–10.3)
Chloride: 93 mmol/L — ABNORMAL LOW (ref 98–110)
Creat: 1.46 mg/dL — ABNORMAL HIGH (ref 0.70–1.11)
GLUCOSE: 112 mg/dL — AB (ref 65–99)
Globulin: 2.8 g/dL (calc) (ref 1.9–3.7)
Potassium: 4.4 mmol/L (ref 3.5–5.3)
SODIUM: 130 mmol/L — AB (ref 135–146)
TOTAL PROTEIN: 7.2 g/dL (ref 6.1–8.1)
Total Bilirubin: 0.9 mg/dL (ref 0.2–1.2)

## 2017-06-17 LAB — CBC WITH DIFFERENTIAL/PLATELET
BASOS ABS: 20 {cells}/uL (ref 0–200)
Basophils Relative: 0.3 %
EOS ABS: 47 {cells}/uL (ref 15–500)
EOS PCT: 0.7 %
HCT: 39.9 % (ref 38.5–50.0)
Hemoglobin: 13.5 g/dL (ref 13.2–17.1)
Lymphs Abs: 2084 cells/uL (ref 850–3900)
MCH: 29.5 pg (ref 27.0–33.0)
MCHC: 33.8 g/dL (ref 32.0–36.0)
MCV: 87.3 fL (ref 80.0–100.0)
MONOS PCT: 10.5 %
MPV: 10.5 fL (ref 7.5–12.5)
NEUTROS ABS: 3846 {cells}/uL (ref 1500–7800)
NEUTROS PCT: 57.4 %
PLATELETS: 175 10*3/uL (ref 140–400)
RBC: 4.57 10*6/uL (ref 4.20–5.80)
RDW: 12.7 % (ref 11.0–15.0)
TOTAL LYMPHOCYTE: 31.1 %
WBC mixed population: 704 cells/uL (ref 200–950)
WBC: 6.7 10*3/uL (ref 3.8–10.8)

## 2017-06-17 LAB — GLUCOSE 16585: GLUCOSE 2500: 111 mg/dL — AB (ref 65–99)

## 2017-06-17 LAB — LIPASE: Lipase: 88 U/L — ABNORMAL HIGH (ref 7–60)

## 2017-06-17 NOTE — Assessment & Plan Note (Signed)
Uncontrolled but now taking meds regulary and having hypoglycemia in setting of weight loss D/C glipizide Recheck metabolic renal function, check lipase Concern for rapid weight loss, no vomiting, no pain, just full feeling- DD include abdominal mass, pancreatic mass, medication SE as takign regulary, sequuela of dementia. No sign of infection He appears well with exception of weight loss Start ensure as well Obtain CT abd/pelvis due to rapid weight loss

## 2017-06-17 NOTE — Progress Notes (Signed)
   Subjective:    Patient ID: Ethan Davis, male    DOB: June 29, 1926, 84 y.o.   MRN: [id number]  Patient presents for Weight Loss (loss of appetite, weight loss- says he's always full)   Pt here for weight loss. He was seen on 12/19 for his CPE, weight at that time was 150lbs, now down to 138lbs states he is always full and only eats a few bites states he feels full already Denies pain, no change in bowels He does have uncontrolled diabetes, he does not take his medications regulary , he is on Glipizide 10 daily and metformin 1000mg  daily , januvoia 25mg  daily  His weight had been steady past 2 years 147-152 lbs   Daughter states he will eat 1/2 a toast and 1/2 egg in the mornings, with coffee, occasional yogurt , he has to be forced to eat as he always states he is full. They have been giving his meds regulary past few weeks  His CBG have been improved- rage fasting 98-137, he has had a few low blood sugars down to 66 at bedtime  Daughter state he has actually been alert, playing with great grandchildren, does not appear to be in any pain  Review Of Systems:  GEN- denies fatigue, fever, +weight loss,weakness, recent illness HEENT- denies eye drainage, change in vision, nasal discharge, CVS- denies chest pain, palpitations RESP- denies SOB, cough, wheeze ABD- denies N/V, change in stools, abd pain GU- denies dysuria, hematuria, dribbling, incontinence MSK- denies joint pain, muscle aches, injury Neuro- denies headache, dizziness, syncope, seizure activity       Objective:    BP 126/70   Pulse 92   Temp 97.7 F (36.5 C) (Oral)   Resp 18   Ht 5\' 11"  (1.803 m)   Wt 138 lb (62.6 kg)   SpO2 96%   BMI 19.25 kg/m  GEN- NAD, alert and oriented x3,weight down 12lbs HEENT- PERRL, EOMI, non injected sclera, pink conjunctiva, MMM, oropharynx clear Neck- Supple, no thyromegaly CVS- RRR, no murmur RESP-CTAB ABD-NABS,soft,NT,ND EXT- No edema Pulses- Radial 2+        Assessment  & Plan:      Problem List Items Addressed This Visit      Unprioritized   Diabetes mellitus type II, uncontrolled (HCC) - Primary    Uncontrolled but now taking meds regulary and having hypoglycemia in setting of weight loss D/C glipizide Recheck metabolic renal function, check lipase Concern for rapid weight loss, no vomiting, no pain, just full feeling- DD include abdominal mass, pancreatic mass, medication SE as takign regulary, sequuela of dementia. No sign of infection He appears well with exception of weight loss Start ensure as well Obtain CT abd/pelvis due to rapid weight loss      Relevant Orders   Glucose, fingerstick (stat)    Other Visit Diagnoses    Weight loss       Relevant Orders   CBC with Differential/Platelet   Comprehensive metabolic panel   Lipase   Early satiety       Relevant Orders   Lipase      Note: This dictation was prepared with Dragon dictation along with smaller phrase technology. Any transcriptional errors that result from this process are unintentional.

## 2017-06-17 NOTE — Patient Instructions (Addendum)
Stop the Glipizide  [facility] for CT scan Start Boost twice a day  F/U pending results

## 2017-06-23 ENCOUNTER — Ambulatory Visit
Admission: RE | Admit: 2017-06-23 | Discharge: 2017-06-23 | Disposition: A | Discharge status: Home / Self Care | Payer: Medicare Other | Source: Ambulatory Visit | Attending: Family Medicine | Admitting: Family Medicine

## 2017-06-23 DIAGNOSIS — R6881 Early satiety: Secondary | ICD-10-CM

## 2017-06-23 DIAGNOSIS — R109 Unspecified abdominal pain: Secondary | ICD-10-CM | POA: Diagnosis not present

## 2017-06-23 DIAGNOSIS — R634 Abnormal weight loss: Secondary | ICD-10-CM

## 2017-06-24 ENCOUNTER — Other Ambulatory Visit: Payer: Self-pay | Admitting: *Deleted

## 2017-06-24 MED ORDER — MIRTAZAPINE 7.5 MG PO TABS: 7.5000 mg | ORAL_TABLET | Freq: Every day | ORAL | 3 refills | Status: DC

## 2017-06-27 ENCOUNTER — Other Ambulatory Visit: Payer: Self-pay

## 2017-06-27 ENCOUNTER — Emergency Department: Payer: Medicare Other

## 2017-06-27 ENCOUNTER — Inpatient Hospital Stay
Admission: EM | Admit: 2017-06-27 | Discharge: 2017-06-28 | DRG: 683 | Disposition: A | Discharge status: Home Health | Payer: Medicare Other | Attending: Internal Medicine | Admitting: Internal Medicine

## 2017-06-27 DIAGNOSIS — I129 Hypertensive chronic kidney disease with stage 1 through stage 4 chronic kidney disease, or unspecified chronic kidney disease: Secondary | ICD-10-CM | POA: Diagnosis present

## 2017-06-27 DIAGNOSIS — F0390 Unspecified dementia without behavioral disturbance: Secondary | ICD-10-CM | POA: Diagnosis present

## 2017-06-27 DIAGNOSIS — Z79899 Other long term (current) drug therapy: Secondary | ICD-10-CM

## 2017-06-27 DIAGNOSIS — R6251 Failure to thrive (child): Secondary | ICD-10-CM

## 2017-06-27 DIAGNOSIS — Z6820 Body mass index (BMI) 20.0-20.9, adult: Secondary | ICD-10-CM

## 2017-06-27 DIAGNOSIS — E785 Hyperlipidemia, unspecified: Secondary | ICD-10-CM | POA: Diagnosis present

## 2017-06-27 DIAGNOSIS — E1122 Type 2 diabetes mellitus with diabetic chronic kidney disease: Secondary | ICD-10-CM | POA: Diagnosis present

## 2017-06-27 DIAGNOSIS — Z87891 Personal history of nicotine dependence: Secondary | ICD-10-CM

## 2017-06-27 DIAGNOSIS — R627 Adult failure to thrive: Secondary | ICD-10-CM | POA: Diagnosis present

## 2017-06-27 DIAGNOSIS — N179 Acute kidney failure, unspecified: Principal | ICD-10-CM

## 2017-06-27 DIAGNOSIS — N182 Chronic kidney disease, stage 2 (mild): Secondary | ICD-10-CM

## 2017-06-27 DIAGNOSIS — N183 Chronic kidney disease, stage 3 (moderate): Secondary | ICD-10-CM | POA: Diagnosis present

## 2017-06-27 DIAGNOSIS — I959 Hypotension, unspecified: Secondary | ICD-10-CM

## 2017-06-27 DIAGNOSIS — R64 Cachexia: Secondary | ICD-10-CM | POA: Diagnosis present

## 2017-06-27 DIAGNOSIS — F0280 Dementia in other diseases classified elsewhere without behavioral disturbance: Secondary | ICD-10-CM

## 2017-06-27 DIAGNOSIS — N400 Enlarged prostate without lower urinary tract symptoms: Secondary | ICD-10-CM | POA: Diagnosis present

## 2017-06-27 DIAGNOSIS — Z961 Presence of intraocular lens: Secondary | ICD-10-CM | POA: Diagnosis present

## 2017-06-27 DIAGNOSIS — R05 Cough: Secondary | ICD-10-CM | POA: Diagnosis not present

## 2017-06-27 DIAGNOSIS — Z7984 Long term (current) use of oral hypoglycemic drugs: Secondary | ICD-10-CM | POA: Diagnosis not present

## 2017-06-27 DIAGNOSIS — E119 Type 2 diabetes mellitus without complications: Secondary | ICD-10-CM | POA: Diagnosis not present

## 2017-06-27 LAB — URINALYSIS, COMPLETE (UACMP) WITH MICROSCOPIC
Bacteria, UA: NONE SEEN
Bilirubin Urine: NEGATIVE
Glucose, UA: NEGATIVE mg/dL
Hgb urine dipstick: NEGATIVE
Ketones, ur: NEGATIVE mg/dL
Leukocytes, UA: NEGATIVE
Nitrite: NEGATIVE
PROTEIN: NEGATIVE mg/dL
SPECIFIC GRAVITY, URINE: 1.01 (ref 1.005–1.030)
pH: 5 (ref 5.0–8.0)

## 2017-06-27 LAB — BASIC METABOLIC PANEL
Anion gap: 14 (ref 5–15)
BUN: 67 mg/dL — AB (ref 6–20)
CALCIUM: 9.8 mg/dL (ref 8.9–10.3)
CO2: 21 mmol/L — AB (ref 22–32)
CREATININE: 3.06 mg/dL — AB (ref 0.61–1.24)
Chloride: 97 mmol/L — ABNORMAL LOW (ref 101–111)
GFR calc non Af Amer: 17 mL/min — ABNORMAL LOW (ref >60)
GFR, EST AFRICAN AMERICAN: 19 mL/min — AB (ref >60)
Glucose, Bld: 168 mg/dL — ABNORMAL HIGH (ref 65–99)
Potassium: 5 mmol/L (ref 3.5–5.1)
SODIUM: 132 mmol/L — AB (ref 135–145)

## 2017-06-27 LAB — CBC
HCT: 41.7 % (ref 40.0–52.0)
Hemoglobin: 13.8 g/dL (ref 13.0–18.0)
MCH: 30.1 pg (ref 26.0–34.0)
MCHC: 33 g/dL (ref 32.0–36.0)
MCV: 91.2 fL (ref 80.0–100.0)
Platelets: 128 K/uL — ABNORMAL LOW (ref 150–440)
RBC: 4.58 MIL/uL (ref 4.40–5.90)
RDW: 13.8 % (ref 11.5–14.5)
WBC: 8.7 K/uL (ref 3.8–10.6)

## 2017-06-27 LAB — LACTIC ACID, PLASMA
LACTIC ACID, VENOUS: 0.8 mmol/L (ref 0.5–1.9)
Lactic Acid, Venous: 2.8 mmol/L (ref 0.5–1.9)

## 2017-06-27 LAB — GLUCOSE, CAPILLARY
GLUCOSE-CAPILLARY: 105 mg/dL — AB (ref 65–99)
Glucose-Capillary: 127 mg/dL — ABNORMAL HIGH (ref 65–99)

## 2017-06-27 LAB — TROPONIN I: Troponin I: <0.03 ng/mL

## 2017-06-27 LAB — TSH: TSH: 2.582 u[IU]/mL (ref 0.350–4.500)

## 2017-06-27 MED ORDER — SODIUM CHLORIDE 0.9 % IV SOLN
INTRAVENOUS | Status: DC
  Administered 2017-06-27 – 2017-06-28 (×2): via INTRAVENOUS

## 2017-06-27 MED ORDER — INSULIN ASPART 100 UNIT/ML ~~LOC~~ SOLN
0.0000 [IU] | Freq: Three times a day (TID) | SUBCUTANEOUS | Status: DC
  Administered 2017-06-27: 1 [IU] via SUBCUTANEOUS
  Filled 2017-06-27: qty 1

## 2017-06-27 MED ORDER — LINAGLIPTIN 5 MG PO TABS
5.0000 mg | ORAL_TABLET | Freq: Every day | ORAL | Status: DC
  Administered 2017-06-28: 5 mg via ORAL
  Filled 2017-06-27: qty 1

## 2017-06-27 MED ORDER — VANCOMYCIN HCL IN DEXTROSE 1-5 GM/200ML-% IV SOLN
1000.0000 mg | Freq: Once | INTRAVENOUS | Status: AC
  Administered 2017-06-27: 1000 mg via INTRAVENOUS
  Filled 2017-06-27: qty 200

## 2017-06-27 MED ORDER — PIPERACILLIN-TAZOBACTAM 3.375 G IVPB 30 MIN
3.3750 g | Freq: Once | INTRAVENOUS | Status: AC
  Administered 2017-06-27: 3.375 g via INTRAVENOUS
  Filled 2017-06-27: qty 50

## 2017-06-27 MED ORDER — DOCUSATE SODIUM 100 MG PO CAPS: 100.0000 mg | ORAL_CAPSULE | Freq: Two times a day (BID) | ORAL | Status: DC | PRN

## 2017-06-27 MED ORDER — HEPARIN SODIUM (PORCINE) 5000 UNIT/ML IJ SOLN
5000.0000 [IU] | Freq: Three times a day (TID) | INTRAMUSCULAR | Status: DC
  Administered 2017-06-27 – 2017-06-28 (×2): 5000 [IU] via SUBCUTANEOUS
  Filled 2017-06-27 (×2): qty 1

## 2017-06-27 MED ORDER — DONEPEZIL HCL 5 MG PO TABS
10.0000 mg | ORAL_TABLET | Freq: Every day | ORAL | Status: DC
  Administered 2017-06-27: 10 mg via ORAL
  Filled 2017-06-27 (×2): qty 2

## 2017-06-27 MED ORDER — SODIUM CHLORIDE 0.9 % IV BOLUS (SEPSIS): 1000.0000 mL | Freq: Once | INTRAVENOUS | Status: DC

## 2017-06-27 MED ORDER — MIRTAZAPINE 15 MG PO TABS
7.5000 mg | ORAL_TABLET | Freq: Every day | ORAL | Status: DC
  Administered 2017-06-27: 7.5 mg via ORAL
  Filled 2017-06-27: qty 1

## 2017-06-27 MED ORDER — FINASTERIDE 5 MG PO TABS
5.0000 mg | ORAL_TABLET | Freq: Every day | ORAL | Status: DC
  Administered 2017-06-28: 5 mg via ORAL
  Filled 2017-06-27: qty 1

## 2017-06-27 MED ORDER — DOXAZOSIN MESYLATE 4 MG PO TABS
4.0000 mg | ORAL_TABLET | Freq: Every day | ORAL | Status: DC
  Administered 2017-06-28: 4 mg via ORAL
  Filled 2017-06-27 (×2): qty 1

## 2017-06-27 MED ORDER — SODIUM CHLORIDE 0.9 % IV BOLUS (SEPSIS)
1000.0000 mL | Freq: Once | INTRAVENOUS | Status: AC
  Administered 2017-06-27: 1000 mL via INTRAVENOUS

## 2017-06-27 MED ORDER — PIPERACILLIN-TAZOBACTAM 3.375 G IVPB: 3.3750 g | Freq: Two times a day (BID) | INTRAVENOUS | Status: DC

## 2017-06-27 MED ORDER — SODIUM CHLORIDE 0.9 % IV BOLUS (SEPSIS)
500.0000 mL | Freq: Once | INTRAVENOUS | Status: AC
  Administered 2017-06-27: 500 mL via INTRAVENOUS

## 2017-06-27 MED ORDER — SIMVASTATIN 20 MG PO TABS
20.0000 mg | ORAL_TABLET | Freq: Every day | ORAL | Status: DC
Start: 2017-06-27 — End: 2017-06-28
  Administered 2017-06-27: 20 mg via ORAL
  Filled 2017-06-27: qty 1

## 2017-06-27 NOTE — ED Notes (Signed)
Called Darrin NipperSuzanna Patino RN

## 2017-06-27 NOTE — ED Notes (Signed)
Pt transported to room 136 

## 2017-06-27 NOTE — ED Notes (Signed)
Patient denies pain and is resting comfortably.  

## 2017-06-27 NOTE — ED Notes (Signed)
Notified ED provider of difficulty obtaining second blood cultures, ed provider stated they are ok with that

## 2017-06-27 NOTE — ED Notes (Signed)
Date and time results received: 06/27/17 1602 (use smartphrase ".now" to insert current time)  Test: Lactic Acid Critical Value: 2.8  Name of Provider Notified: Dr. Shaune PollackLord  Orders Received? Or Actions Taken?: Actions Taken: notified

## 2017-06-27 NOTE — Progress Notes (Signed)
Family Meeting Note  Advance Directive:yes  Today a meeting took place with the 3 daughters.  Patient is unable to participate due EA:[id number]:Lacked capacity dementia   The following clinical team members were present during this meeting:MD  The following were discussed:Patient's diagnosis: failure to thrive, ac on ch renal failure , Patient's progosis: Unable to determine and Goals for treatment: Full Code  Additional follow-up to be provided: palliative care  Time spent during discussion:20 minutes  Altamese DillingVaibhavkumar Maudry Zeidan, MD

## 2017-06-27 NOTE — H&P (Signed)
[facility] [place] [facility]   PATIENT NAME: Ethan Davis    MR#:  [id number]  DATE OF BIRTH:  Feb 05, 1927  DATE OF ADMISSION:  06/27/2017  PRIMARY CARE PHYSICIAN: Salley Scarlet, MD   REQUESTING/REFERRING PHYSICIAN: Shaune Pollack   CHIEF COMPLAINT:   Chief Complaint  Patient presents with  . Weakness    HISTORY OF PRESENT ILLNESS: Ethan Davis  is a 84 y.o. male with a known history of BPH, DM, Htn, dementia- Able to walk without any support, but need help due to his dementia in his daily life. Daughters live nearby.  For las few weeks, not eating or drinking much and loosing weight. Went to PMD last week and CT abd and pelvis done- which did not show any abnormalities. He was weak today, so they brought to ER. Noted Ac on [facility] renal failure. No distress or new complains.  PAST MEDICAL HISTORY:   Past Medical History:  Diagnosis Date  . BPH (benign prostatic hyperplasia)   . Cataract   . Diabetes mellitus   . Hypercholesteremia   . Hypertension     PAST SURGICAL HISTORY:  Past Surgical History:  Procedure Laterality Date  . CATARACT EXTRACTION W/PHACO  03/13/2011   Procedure: CATARACT EXTRACTION PHACO AND INTRAOCULAR LENS PLACEMENT (IOC);  Surgeon: Gemma Payor;  Location: [facility];  Service: Ophthalmology;  Laterality: Right;  CDE: 14.58  . EYE SURGERY     left cataract extraction  . FRACTURE SURGERY Right 1980   pin in right knee  . ORIF KNEE DISLOCATION  as child   right     SOCIAL HISTORY:  Social History   Tobacco Use  . Smoking status: Former Smoker    Packs/day: 0.25    Years: 20.00    Pack years: 5.00    Types: Cigarettes    Last attempt to quit: 03/04/1964    Years since quitting: 53.3  . Smokeless tobacco: Never Used  Substance Use Topics  . Alcohol use: Yes    Alcohol/week: 0.0 oz    Comment: very little    FAMILY HISTORY:  Family History  Problem Relation Age of Onset  . Anesthesia problems Neg Hx   . Hypotension Neg Hx   .  Malignant hyperthermia Neg Hx   . Pseudochol deficiency Neg Hx     DRUG ALLERGIES: No Known Allergies  REVIEW OF SYSTEMS:   Pt have dementia, so not giving ROS.  MEDICATIONS AT HOME:  Prior to Admission medications   Medication Sig Start Date End Date Taking? Authorizing Provider  Acetaminophen 650 MG TABS Take 1 tablet (650 mg total) by mouth 3 (three) times daily as needed. 11/14/12  Yes Moreno-Coll, Adlih, MD  donepezil (ARICEPT) 10 MG tablet TAKE 1 TABLET BY MOUTH AT BEDTIME (DOSE CHANGED) 03/03/16  Yes [place], Velna Hatchet, MD  doxazosin (CARDURA) 4 MG tablet TAKE 1 TABLET (4 MG TOTAL) BY MOUTH AT BEDTIME. 03/03/16  Yes [place], Velna Hatchet, MD  finasteride (PROSCAR) 5 MG tablet TAKE 1 TABLET (5 MG TOTAL) BY MOUTH DAILY. 03/03/16  Yes [place], Velna Hatchet, MD  glipiZIDE (GLUCOTROL) 10 MG tablet Take 5 mg by mouth daily before breakfast.   Yes [provider]  ibuprofen (ADVIL,MOTRIN) 200 MG tablet Take 200,400 mg by mouth every 6 (six) hours as needed.   Yes [provider]  lisinopril-hydrochlorothiazide (PRINZIDE,ZESTORETIC) 10-12.5 MG tablet Take 1 tablet by mouth daily. 03/03/16  Yes [place], Velna Hatchet, MD  metFORMIN (GLUCOPHAGE) 1000 MG tablet Take 1  tablet (1,000 mg total) by mouth 2 (two) times daily with a meal. 03/03/16  Yes [place], Velna HatchetKawanta F, MD  mirtazapine (REMERON) 7.5 MG tablet Take 1 tablet (7.5 mg total) by mouth at bedtime. 06/24/17  Yes [place], Velna HatchetKawanta F, MD  simvastatin (ZOCOR) 20 MG tablet Take 1 tablet (20 mg total) by mouth at bedtime. 03/03/16  Yes [place], Velna HatchetKawanta F, MD  sitaGLIPtin (JANUVIA) 25 MG tablet TAKE 1 TABLET (25 MG TOTAL) BY MOUTH DAILY. 03/03/16  Yes , Velna HatchetKawanta F, MD      PHYSICAL EXAMINATION:   VITAL SIGNS: Blood pressure (!) 108/50, pulse 69, resp. rate 12, height 5\' 11"  (1.803 m), weight 62.6 kg (138 lb), SpO2 98 %.  GENERAL:  84 y.o.-year-old thin , malnoutrished patient lying in the bed with no acute distress.  EYES: Pupils equal, round,  reactive to light and accommodation. No scleral icterus. Extraocular muscles intact.  HEENT: Head atraumatic, normocephalic. Oropharynx and nasopharynx clear.  NECK:  Supple, no jugular venous distention. No thyroid enlargement, no tenderness.  LUNGS: Normal breath sounds bilaterally, no wheezing, rales,rhonchi or crepitation. No use of accessory muscles of respiration.  CARDIOVASCULAR: S1, S2 normal. No murmurs, rubs, or gallops.  ABDOMEN: Soft, nontender, nondistended. Bowel sounds present. No organomegaly or mass.  EXTREMITIES: No pedal edema, cyanosis, or clubbing.  NEUROLOGIC: Cranial nerves II through XII are intact. Muscle strength 4-5/5 in all extremities. Sensation intact. Gait not checked.  PSYCHIATRIC: The patient is alert and oriented x 1.  SKIN: No obvious rash, lesion, or ulcer.   LABORATORY PANEL:   CBC Recent Labs  Lab 06/27/17 1324  WBC 8.7  HGB 13.8  HCT 41.7  PLT 128*  MCV 91.2  MCH 30.1  MCHC 33.0  RDW 13.8   ------------------------------------------------------------------------------------------------------------------  Chemistries  Recent Labs  Lab 06/27/17 1324  NA 132*  K 5.0  CL 97*  CO2 21*  GLUCOSE 168*  BUN 67*  CREATININE 3.06*  CALCIUM 9.8   ------------------------------------------------------------------------------------------------------------------ estimated creatinine clearance is 14.8 mL/min (A) (by C-G formula based on SCr of 3.06 mg/dL (H)). ------------------------------------------------------------------------------------------------------------------ No results for input(s): TSH, T4TOTAL, T3FREE, THYROIDAB in the last 72 hours.  Invalid input(s): FREET3   Coagulation profile No results for input(s): INR, PROTIME in the last 168 hours. ------------------------------------------------------------------------------------------------------------------- No results for input(s): DDIMER in the last 72  hours. -------------------------------------------------------------------------------------------------------------------  Cardiac Enzymes Recent Labs  Lab 06/27/17 1324  TROPONINI <0.03   ------------------------------------------------------------------------------------------------------------------ Invalid input(s): POCBNP  ---------------------------------------------------------------------------------------------------------------  Urinalysis No results found for: COLORURINE, APPEARANCEUR, LABSPEC, PHURINE, GLUCOSEU, HGBUR, BILIRUBINUR, KETONESUR, PROTEINUR, UROBILINOGEN, NITRITE, LEUKOCYTESUR   RADIOLOGY: No results found.  EKG: Orders placed or performed during the hospital encounter of 06/27/17  . EKG 12-Lead  . EKG 12-Lead  . ED EKG  . ED EKG    IMPRESSION AND PLAN:  * Ac on Ch renal failure on CKD stage 3   Due to decreased intake and Nephrotoxic meds + hypotension   Stop Htn meds, stop DM meds.   IV fluids and monitor.  * Failure to thrive   Likely Dementia related.   CT abd and pelvis is negative.   Xray chest is done , no report yet.   Can not do CT with contrast on Chest as renal failure.   He was a smoker in past.   If no malignancy found, may need palliative care consult to address progression of dementia.   Also check TSH  * Hypotension    ER suspected sepsis, Gave one dose broad spectrum  Abx, Cx sent    No Sign of infection   I will not give Abx. No sepsis.  * DM   Hold Glipizide and metofrmin   COnt ISS.  * dementia   Monitor.  * BPH     Cotn home meds   All the records are reviewed and case discussed with ED provider. Management plans discussed with the patient, family and they are in agreement.  CODE STATUS: Full. Code Status History    This patient does not have a recorded code status. Please follow your organizational policy for patients in this situation.      Discussed with 3 daughters in room. TOTAL TIME TAKING CARE  OF THIS PATIENT: 50 minutes.    Altamese Dilling M.D on 06/27/2017   Between 7am to 6pm - Pager - [phone]  After 6pm go to [email or web address] - password [employer]  [facility]  Office  [phone]  CC: Primary care physician; Salley Scarlet, MD   Note: This dictation was prepared with Dragon dictation along with smaller phrase technology. Any transcriptional errors that result from this process are unintentional.

## 2017-06-27 NOTE — ED Provider Notes (Signed)
[facility] Emergency Department Provider Note ____________________________________________   I have reviewed the triage vital signs and the triage nursing note.  HISTORY  Chief Complaint Weakness   Historian Level 5 Caveat History Limited by patient limited historian due to poor historian Multiple daughters provide the history  HPI Ethan Davis is a 84 y.o. male lives at home alone, but his daughters and family members check on him daily.  He has had some decline over the past 2 weeks in terms of not eating and drinking very well.  They feel like he might of had a cough.  This morning he does seem very weak and stated he just could hardly stand up.  No focal weakness.  Denies pain.  Like he had an episode or 2 of loose watery bowel movement this morning which was nonbloody.  No reported fevers.  No chest pain.   Past Medical History:  Diagnosis Date  . BPH (benign prostatic hyperplasia)   . Cataract   . Diabetes mellitus   . Hypercholesteremia   . Hypertension     Patient Active Problem List   Diagnosis Date Noted  . Loss of weight 07/04/2014  . Lumbar back pain 12/20/2013  . Mild cognitive impairment 05/30/2013  . Diabetes mellitus type II, uncontrolled (HCC) 03/02/2013  . Essential hypertension, benign 03/02/2013  . Hyperlipidemia 03/02/2013  . OA (osteoarthritis) 03/02/2013  . BPH (benign prostatic hypertrophy) 03/02/2013    Past Surgical History:  Procedure Laterality Date  . CATARACT EXTRACTION W/PHACO  03/13/2011   Procedure: CATARACT EXTRACTION PHACO AND INTRAOCULAR LENS PLACEMENT (IOC);  Surgeon: Gemma Payor;  Location: [facility];  Service: Ophthalmology;  Laterality: Right;  CDE: 14.58  . EYE SURGERY     left cataract extraction  . FRACTURE SURGERY Right 1980   pin in right knee  . ORIF KNEE DISLOCATION  as child   right     Prior to Admission medications   Medication Sig Start Date End Date Taking? Authorizing Provider   Acetaminophen 650 MG TABS Take 1 tablet (650 mg total) by mouth 3 (three) times daily as needed. 11/14/12   Moreno-Coll, Adlih, MD  donepezil (ARICEPT) 10 MG tablet TAKE 1 TABLET BY MOUTH AT BEDTIME (DOSE CHANGED) 03/03/16   [place], Velna Hatchet, MD  doxazosin (CARDURA) 4 MG tablet TAKE 1 TABLET (4 MG TOTAL) BY MOUTH AT BEDTIME. 03/03/16   [place], Velna Hatchet, MD  finasteride (PROSCAR) 5 MG tablet TAKE 1 TABLET (5 MG TOTAL) BY MOUTH DAILY. 03/03/16   [place], Velna Hatchet, MD  lisinopril-hydrochlorothiazide (PRINZIDE,ZESTORETIC) 10-12.5 MG tablet Take 1 tablet by mouth daily. 03/03/16   Salley Scarlet, MD  metFORMIN (GLUCOPHAGE) 1000 MG tablet Take 1 tablet (1,000 mg total) by mouth 2 (two) times daily with a meal. 03/03/16   [place], Velna Hatchet, MD  mirtazapine (REMERON) 7.5 MG tablet Take 1 tablet (7.5 mg total) by mouth at bedtime. 06/24/17   Salley Scarlet, MD  simvastatin (ZOCOR) 20 MG tablet Take 1 tablet (20 mg total) by mouth at bedtime. 03/03/16   [place], Velna Hatchet, MD  sitaGLIPtin (JANUVIA) 25 MG tablet TAKE 1 TABLET (25 MG TOTAL) BY MOUTH DAILY. 03/03/16   Salley Scarlet, MD    No Known Allergies  Family History  Problem Relation Age of Onset  . Anesthesia problems Neg Hx   . Hypotension Neg Hx   . Malignant hyperthermia Neg Hx   . Pseudochol deficiency Neg Hx     Social History Social  History   Tobacco Use  . Smoking status: Former Smoker    Packs/day: 0.25    Years: 20.00    Pack years: 5.00    Types: Cigarettes    Last attempt to quit: 03/04/1964    Years since quitting: 53.3  . Smokeless tobacco: Never Used  Substance Use Topics  . Alcohol use: Yes    Alcohol/week: 0.0 oz    Comment: very little  . Drug use: No    Review of Systems  Constitutional: Negative for fever. Eyes: Negative for visual changes. ENT: Negative for sore throat. Cardiovascular: Negative for chest pain. Respiratory: Today for cough, nonproductive. Gastrointestinal: Negative for abdominal pain,  nausea for diarrhea. Genitourinary: Negative for dysuria. Musculoskeletal: Negative for back pain. Skin: Negative for rash. Neurological: Negative for headache.  ____________________________________________   PHYSICAL EXAM:  VITAL SIGNS: ED Triage Vitals [06/27/17 1323]  Enc Vitals Group     BP (!) 64/41     Pulse Rate 80     Resp 14     Temp      Temp src      SpO2 98 %     Weight 138 lb (62.6 kg)     Height 5\' 11"  (1.803 m)     Head Circumference      Peak Flow      Pain Score      Pain Loc      Pain Edu?      Excl. in GC?      Constitutional: Alert and cooperative, cachectic. Well appearing and in no distress. HEENT   Head: Normocephalic and atraumatic.      Eyes: Conjunctivae are normal. Pupils equal and round.       Ears:         Nose: No congestion/rhinnorhea.   Mouth/Throat: Mucous membranes are mildly dry.   Neck: No stridor. Cardiovascular/Chest: Normal rate, regular rhythm.  No murmurs, rubs, or gallops. Respiratory: Normal respiratory effort without tachypnea nor retractions. Breath sounds are clear and equal bilaterally. No wheezes/rales/rhonchi. Gastrointestinal: Soft. No distention, no guarding, no rebound. Nontender.    Genitourinary/rectal:Deferred Musculoskeletal: Nontender with normal range of motion in all extremities. No joint effusions.  No lower extremity tenderness.  No edema. Neurologic: No facial droop.  Normal speech and language. No gross or focal neurologic deficits are appreciated. Skin:  Skin is warm, dry and intact. No rash noted. Psychiatric: Mood and affect are normal. Speech and behavior are normal. Patient exhibits appropriate insight and judgment.   ____________________________________________  LABS (pertinent positives/negatives) I, Governor Rooksebecca Kearston Putman, MD the attending physician have reviewed the labs noted below.  Labs Reviewed  BASIC METABOLIC PANEL - Abnormal; Notable for the following components:      Result Value    Sodium 132 (*)    Chloride 97 (*)    CO2 21 (*)    Glucose, Bld 168 (*)    BUN 67 (*)    Creatinine, Ser 3.06 (*)    GFR calc non Af Amer 17 (*)    GFR calc Af Amer 19 (*)    All other components within normal limits  CBC - Abnormal; Notable for the following components:   Platelets 128 (*)    All other components within normal limits  CULTURE, BLOOD (ROUTINE X 2)  CULTURE, BLOOD (ROUTINE X 2)  TROPONIN I  URINALYSIS, COMPLETE (UACMP) WITH MICROSCOPIC  LACTIC ACID, PLASMA  LACTIC ACID, PLASMA  CBG MONITORING, ED    ____________________________________________    EKG I,  Governor Rooks, MD, the attending physician have personally viewed and interpreted all ECGs.  80 beats per minute.  normal sinus rhythm.  Narrow QRS.  Normal axis.  Nonspecific ST and T wave ____________________________________________  RADIOLOGY All Xrays were viewed by me.  Imaging interpreted by Radiologist, and I, Governor Rooks, MD the attending physician have reviewed the radiologist interpretation noted below.  Chest x-ray: Pending __________________________________________  PROCEDURES  Procedure(s) performed: None  Critical Care performed: None   ____________________________________________  ED COURSE / ASSESSMENT AND PLAN  Pertinent labs & imaging results that were available during my care of the patient were reviewed by me and considered in my medical decision making (see chart for details).   Patient found to be hypotensive on arrival, started on IV fluids and blood pressure did come up after 1 L to 90/40.  I am starting second liter.  All other not describing a fever and is afebrile here, concern for the possibility of association with possible early sepsis, will go ahead and cover for unspecified cause.  No hypoxia, but report of cough.   Patient found to have acute renal failure.  This was discussed with the patient and the family.  Patient will be admitted to the hospital for  further management.  Chest x-ray is pending.  Urinalysis pending.  Lactate is still pending. UA is pending.   DIFFERENTIAL DIAGNOSIS: Including but not limited to sepsis, dehydration, acute renal failure, failure to thrive, electrolyte disturbance, pneumonia, gastroenteritis, diverticulitis, etc.  CONSULTATIONS: Hospitalist for admission, spoke with Dr. Judithann Sheen.   Patient / Family / Caregiver informed of clinical course, medical decision-making process, and agree with plan.   ___________________________________________   FINAL CLINICAL IMPRESSION(S) / ED DIAGNOSES   Final diagnoses:  Acute renal failure, unspecified acute renal failure type (HCC)  Hypotension, unspecified hypotension type      ___________________________________________        Note: This dictation was prepared with Dragon dictation. Any transcriptional errors that result from this process are unintentional    Governor Rooks, MD 06/27/17 1542

## 2017-06-27 NOTE — ED Triage Notes (Signed)
Pt came to ED via pov c/o weakness, sob, diarrhea. Pt has dementia. Bp 62/41 in triage. HR 80.

## 2017-06-27 NOTE — Progress Notes (Signed)
Patient admitted to room 136. Patient is alert to self and place. BP 107/54. Patient currently receiving IV bolus. Skin assessment and admission completed. Patient was educated about safety and call system. Patient is able to stand up with some assistance.  Bed alarms on and bed in lowest position.

## 2017-06-27 NOTE — ED Notes (Signed)
Stuck for 2nd BC, unable to get blood to fill container.

## 2017-06-28 DIAGNOSIS — I959 Hypotension, unspecified: Secondary | ICD-10-CM | POA: Diagnosis not present

## 2017-06-28 DIAGNOSIS — E1122 Type 2 diabetes mellitus with diabetic chronic kidney disease: Secondary | ICD-10-CM | POA: Diagnosis not present

## 2017-06-28 DIAGNOSIS — I129 Hypertensive chronic kidney disease with stage 1 through stage 4 chronic kidney disease, or unspecified chronic kidney disease: Secondary | ICD-10-CM | POA: Diagnosis not present

## 2017-06-28 DIAGNOSIS — F0390 Unspecified dementia without behavioral disturbance: Secondary | ICD-10-CM | POA: Diagnosis not present

## 2017-06-28 DIAGNOSIS — E119 Type 2 diabetes mellitus without complications: Secondary | ICD-10-CM | POA: Diagnosis not present

## 2017-06-28 DIAGNOSIS — R64 Cachexia: Secondary | ICD-10-CM | POA: Diagnosis not present

## 2017-06-28 DIAGNOSIS — N179 Acute kidney failure, unspecified: Secondary | ICD-10-CM | POA: Diagnosis not present

## 2017-06-28 DIAGNOSIS — N183 Chronic kidney disease, stage 3 (moderate): Secondary | ICD-10-CM | POA: Diagnosis not present

## 2017-06-28 DIAGNOSIS — R627 Adult failure to thrive: Secondary | ICD-10-CM | POA: Diagnosis not present

## 2017-06-28 LAB — CBC
HCT: 34.4 % — ABNORMAL LOW (ref 40.0–52.0)
Hemoglobin: 11.6 g/dL — ABNORMAL LOW (ref 13.0–18.0)
MCH: 30.4 pg (ref 26.0–34.0)
MCHC: 33.6 g/dL (ref 32.0–36.0)
MCV: 90.4 fL (ref 80.0–100.0)
PLATELETS: 116 10*3/uL — AB (ref 150–440)
RBC: 3.8 MIL/uL — AB (ref 4.40–5.90)
RDW: 13.4 % (ref 11.5–14.5)
WBC: 5.5 10*3/uL (ref 3.8–10.6)

## 2017-06-28 LAB — BASIC METABOLIC PANEL
Anion gap: 7 (ref 5–15)
BUN: 40 mg/dL — ABNORMAL HIGH (ref 6–20)
CALCIUM: 8.6 mg/dL — AB (ref 8.9–10.3)
CHLORIDE: 108 mmol/L (ref 101–111)
CO2: 22 mmol/L (ref 22–32)
CREATININE: 1.74 mg/dL — AB (ref 0.61–1.24)
GFR, EST AFRICAN AMERICAN: 39 mL/min — AB (ref >60)
GFR, EST NON AFRICAN AMERICAN: 33 mL/min — AB (ref >60)
Glucose, Bld: 71 mg/dL (ref 65–99)
Potassium: 4.5 mmol/L (ref 3.5–5.1)
SODIUM: 137 mmol/L (ref 135–145)

## 2017-06-28 LAB — GLUCOSE, CAPILLARY
Glucose-Capillary: 70 mg/dL (ref 65–99)
Glucose-Capillary: 100 mg/dL — ABNORMAL HIGH (ref 65–99)

## 2017-06-28 MED ORDER — SODIUM CHLORIDE 0.9% FLUSH
3.0000 mL | Freq: Two times a day (BID) | INTRAVENOUS | Status: DC
  Administered 2017-06-28: 3 mL via INTRAVENOUS

## 2017-06-28 NOTE — Progress Notes (Signed)
[facility] [place] at [facility]   PATIENT NAME: Ethan GeraldsJohn Davis    MR#:  [id number]  DATE OF BIRTH:  1926-09-19  SUBJECTIVE:   patient with dementia family at bedside  REVIEW OF SYSTEMS:    dementia   Tolerating Diet: YES a bit      DRUG ALLERGIES:  No Known Allergies  VITALS:  Blood pressure (!) 107/59, pulse (!) 58, temperature 98.8 F (37.1 C), temperature source Oral, resp. rate 20, height 5\' 11"  (1.803 m), weight 62.6 kg (138 lb), SpO2 100 %.  PHYSICAL EXAMINATION:  Constitutional: Appears well-developed and well-nourished. No distress. HENT: Normocephalic. Marland Kitchen. Oropharynx is clear and moist.  Eyes: Conjunctivae and EOM are normal. PERRLA, no scleral icterus.  Neck: Normal ROM. Neck supple. No JVD. No tracheal deviation. CVS: RRR, S1/S2 +, no murmurs, no gallops, no carotid bruit.  Pulmonary: Effort and breath sounds normal, no stridor, rhonchi, wheezes, rales.  Abdominal: Soft. BS +,  no distension, tenderness, rebound or guarding.  Musculoskeletal: Normal range of motion. No edema and no tenderness.  Neuro: Alert. CN 2-12 grossly intact. No focal deficits.oriented to name not place or time Skin: Skin is warm and dry. No rash noted. Psychiatric: dementia     LABORATORY PANEL:   CBC Recent Labs  Lab 06/28/17 0335  WBC 5.5  HGB 11.6*  HCT 34.4*  PLT 116*   ------------------------------------------------------------------------------------------------------------------  Chemistries  Recent Labs  Lab 06/28/17 0335  NA 137  K 4.5  CL 108  CO2 22  GLUCOSE 71  BUN 40*  CREATININE 1.74*  CALCIUM 8.6*   ------------------------------------------------------------------------------------------------------------------  Cardiac Enzymes Recent Labs  Lab 06/27/17 1324  TROPONINI <0.03   ------------------------------------------------------------------------------------------------------------------  RADIOLOGY:  Dg Chest Port 1  View  Result Date: 06/27/2017 CLINICAL DATA:  Hypotension EXAM: PORTABLE CHEST 1 VIEW COMPARISON:  None. FINDINGS: There is a calcified granuloma in the right base. There is no edema or consolidation. Heart size and pulmonary vascular normal. There is a calcified azygos lymph node. No adenopathy evident. No bone lesions. IMPRESSION: Evidence of prior granulomatous disease.  No edema or consolidation. Electronically Signed   By: Bretta BangWilliam  Woodruff III M.D.   On: 06/27/2017 16:18     ASSESSMENT AND PLAN:   84 year old male with dementia who presents due to decreased oral intake.  1. Acute on chronic kidney disease stage III: Acute kidney injury is due to decreased by mouth intake from progressive dementia. Creatinine has improved with IV fluids Discontinue any nephrotoxic medications  2. Progressive dementia: Patient would benefit from palliative care consultation at discharge CT scan of the abdomen performed by PCP was negative for acute pathology Continue donepezil 3. Hypotension from poor by mouth intake No evidence of sepsis and sepsis has been ruled out  4. BPH: Continue Cardura and Proscar  5. Diabetes: Since patient has decreased oral intake would not advise restarting oral medications.   Awaiting PT evaluation   Management plans discussed with the patient' family and they are in agreement.  CODE STATUS: Full  TOTAL TIME TAKING CARE OF THIS PATIENT: 30 minutes.     POSSIBLE D/C today, DEPENDING on PT evaluation  Theophile Harvie M.D on 06/28/2017 at 8:37 AM  Between 7am to 6pm - Pager - [phone] After 6pm go to [email or web address] - password [email or web address]  [facility] [place] [facility]  Office  [phone]  CC: Primary care physician; Salley Scarleturham, Kawanta F, MD  Note: This dictation was prepared with Dragon dictation along with smaller phrase technology. Any transcriptional  errors that result from this process are unintentional.

## 2017-06-28 NOTE — Discharge Summary (Signed)
[facility] - [place] at [facility]   PATIENT NAME: Ethan GeraldsJohn Davis    MR#:  [id number]  DATE OF BIRTH:  Jun 21, 1926  DATE OF ADMISSION:  06/27/2017 ADMITTING PHYSICIAN: Altamese DillingVaibhavkumar Vachhani, MD  DATE OF DISCHARGE: 06/28/2017  PRIMARY CARE PHYSICIAN: Salley Scarleturham, Kawanta F, MD    ADMISSION DIAGNOSIS:  Hypotension, unspecified hypotension type [I95.9] Acute renal failure, unspecified acute renal failure type (HCC) [N17.9]  DISCHARGE DIAGNOSIS:  Principal Problem:   Acute on chronic renal failure (HCC) Active Problems:   Failure to thrive (0-17)   Acute-on-chronic renal failure (HCC)   SECONDARY DIAGNOSIS:   Past Medical History:  Diagnosis Date  . BPH (benign prostatic hyperplasia)   . Cataract   . Diabetes mellitus   . Hypercholesteremia   . Hypertension     HOSPITAL COURSE:   84 year old male with dementia who presents due to decreased oral intake.  1. Acute on chronic kidney disease stage III: Acute kidney injury is due to decreased by mouth intake from progressive dementia. Creatinine has improved with IV fluids Discontinue any nephrotoxic medications  2. Progressive dementia: Patient would benefit from palliative care consultation at discharge CT scan of the abdomen performed by PCP was negative for acute pathology Continue donepezil 3. Hypotension from poor by mouth intake No evidence of sepsis and sepsis has been ruled out  4. BPH: Continue Cardura and Proscar  5. Diabetes: Since patient has decreased oral intake would not advise restarting oral medications.   Outpatient palliative care consultation  DISCHARGE CONDITIONS AND DIET:   Stable on regular diet  CONSULTS OBTAINED:    DRUG ALLERGIES:  No Known Allergies  DISCHARGE MEDICATIONS:   Allergies as of 06/28/2017   No Known Allergies     Medication List    STOP taking these medications   glipiZIDE 10 MG tablet Commonly known as:  GLUCOTROL   ibuprofen 200 MG  tablet Commonly known as:  ADVIL,MOTRIN   lisinopril-hydrochlorothiazide 10-12.5 MG tablet Commonly known as:  PRINZIDE,ZESTORETIC   metFORMIN 1000 MG tablet Commonly known as:  GLUCOPHAGE   sitaGLIPtin 25 MG tablet Commonly known as:  JANUVIA     TAKE these medications   Acetaminophen 650 MG Tabs Take 1 tablet (650 mg total) by mouth 3 (three) times daily as needed.   donepezil 10 MG tablet Commonly known as:  ARICEPT TAKE 1 TABLET BY MOUTH AT BEDTIME (DOSE CHANGED)   doxazosin 4 MG tablet Commonly known as:  CARDURA TAKE 1 TABLET (4 MG TOTAL) BY MOUTH AT BEDTIME.   finasteride 5 MG tablet Commonly known as:  PROSCAR TAKE 1 TABLET (5 MG TOTAL) BY MOUTH DAILY.   mirtazapine 7.5 MG tablet Commonly known as:  REMERON Take 1 tablet (7.5 mg total) by mouth at bedtime.   simvastatin 20 MG tablet Commonly known as:  ZOCOR Take 1 tablet (20 mg total) by mouth at bedtime.         Today   CHIEF COMPLAINT:   no acute events overnight   VITAL SIGNS:  Blood pressure (!) 107/59, pulse (!) 58, temperature 98.8 F (37.1 C), temperature source Oral, resp. rate 20, height 5\' 11"  (1.803 m), weight 62.6 kg (138 lb), SpO2 100 %.   REVIEW OF SYSTEMS:  Review of Systems  Unable to perform ROS: Dementia     PHYSICAL EXAMINATION:  GENERAL:  84 y.o.-year-old patient lying in the bed with no acute distress.  NECK:  Supple, no jugular venous distention. No thyroid enlargement, no tenderness.  LUNGS: Normal  breath sounds bilaterally, no wheezing, rales,rhonchi  No use of accessory muscles of respiration.  CARDIOVASCULAR: S1, S2 normal. No murmurs, rubs, or gallops.  ABDOMEN: Soft, non-tender, non-distended. Bowel sounds present. No organomegaly or mass.  EXTREMITIES: No pedal edema, cyanosis, or clubbing.  PSYCHIATRIC: The patient is alert and oriented x name SKIN: No obvious rash, lesion, or ulcer.   DATA REVIEW:   CBC Recent Labs  Lab 06/28/17 0335  WBC 5.5  HGB  11.6*  HCT 34.4*  PLT 116*    Chemistries  Recent Labs  Lab 06/28/17 0335  NA 137  K 4.5  CL 108  CO2 22  GLUCOSE 71  BUN 40*  CREATININE 1.74*  CALCIUM 8.6*    Cardiac Enzymes Recent Labs  Lab 06/27/17 1324  TROPONINI <0.03    Microbiology Results  @[email or web address] @  RADIOLOGY:  Dg Chest Port 1 View  Result Date: 06/27/2017 CLINICAL DATA:  Hypotension EXAM: PORTABLE CHEST 1 VIEW COMPARISON:  None. FINDINGS: There is a calcified granuloma in the right base. There is no edema or consolidation. Heart size and pulmonary vascular normal. There is a calcified azygos lymph node. No adenopathy evident. No bone lesions. IMPRESSION: Evidence of prior granulomatous disease.  No edema or consolidation. Electronically Signed   By: Bretta Bang III M.D.   On: 06/27/2017 16:18      Allergies as of 06/28/2017   No Known Allergies     Medication List    STOP taking these medications   glipiZIDE 10 MG tablet Commonly known as:  GLUCOTROL   ibuprofen 200 MG tablet Commonly known as:  ADVIL,MOTRIN   lisinopril-hydrochlorothiazide 10-12.5 MG tablet Commonly known as:  PRINZIDE,ZESTORETIC   metFORMIN 1000 MG tablet Commonly known as:  GLUCOPHAGE   sitaGLIPtin 25 MG tablet Commonly known as:  JANUVIA     TAKE these medications   Acetaminophen 650 MG Tabs Take 1 tablet (650 mg total) by mouth 3 (three) times daily as needed.   donepezil 10 MG tablet Commonly known as:  ARICEPT TAKE 1 TABLET BY MOUTH AT BEDTIME (DOSE CHANGED)   doxazosin 4 MG tablet Commonly known as:  CARDURA TAKE 1 TABLET (4 MG TOTAL) BY MOUTH AT BEDTIME.   finasteride 5 MG tablet Commonly known as:  PROSCAR TAKE 1 TABLET (5 MG TOTAL) BY MOUTH DAILY.   mirtazapine 7.5 MG tablet Commonly known as:  REMERON Take 1 tablet (7.5 mg total) by mouth at bedtime.   simvastatin 20 MG tablet Commonly known as:  ZOCOR Take 1 tablet (20 mg total) by mouth at bedtime.          Management plans  discussed with the patient's family and they are in agreement. Stable for discharge   Patient should follow up with pcp  CODE STATUS:     Code Status Orders  (From admission, onward)        Start     Ordered   06/27/17 1757  Full code  Continuous     06/27/17 1757    Code Status History    Date Active Date Inactive Code Status Order ID Comments User Context   This patient has a current code status but no historical code status.    Advance Directive Documentation     Most Recent Value  Type of Advance Directive  Healthcare Power of Attorney, Living will  Pre-existing out of facility DNR order (yellow form or pink MOST form)  No data  "MOST" Form in Place?  No data  TOTAL TIME TAKING CARE OF THIS PATIENT: 38 minutes.    Note: This dictation was prepared with Dragon dictation along with smaller phrase technology. Any transcriptional errors that result from this process are unintentional.  Adelyna Brockman M.D on 06/28/2017 at 8:43 AM  Between 7am to 6pm - Pager - [phone] After 6pm go to [email or web address] - Social research officer, government  [facility] [place] [facility]  Office  [phone]  CC: Primary care physician; Salley Scarlet, MD

## 2017-06-28 NOTE — Care Management Note (Signed)
Case Management Note  Patient Details  Name: Ethan Davis MRN: [id number] Date of Birth: 11-13-1926  Subjective/Objective:       Per Dr Juliene PinaMody, Mr [name] will be discharged home today with Home Health Services.              Action/Plan:   Expected Discharge Date:  06/29/17               Expected Discharge Plan:     In-House Referral:     Discharge planning Services     Post Acute Care Choice:    Choice offered to:     DME Arranged:    DME Agency:     HH Arranged:    HH Agency:     Status of Service:     If discussed at [employer] Meetings, dates discussed:    Additional Comments:  [name], RN 06/28/2017, 9:32 AM

## 2017-06-28 NOTE — Evaluation (Signed)
Physical Therapy Evaluation Patient Details Name: Ethan Davis MRN: [id number] DOB: [birth year] Today's Date: 06/28/2017   History of Present Illness  84 yo Male came to ED with increased fatigue/low physical functioning; He has dementia at baseline. Patient was diagnosed with dehydration and acute on chronic renal failure; PMH significant: dementia, BPH, DM, HTN; patient lives alone but family is nearby and someone is with him 24/7;   Clinical Impression  84 yo Male with dementia and dehydration reports increased decline in mobility over last few weeks. Patient lives in his home alone but family is there 24/7 for supervision/assistance. They report in last few weeks he has needed increased assistance for ADLs including bathing/dressing. They tried to get him to use a [facility] for ambulation for safety but patient often will just carry the cane. Patient is mod I for bed mobility; He requires CGA for sit<>Stand transfers without AD. Patient ambulated 160 feet on level surface without AD, CGA for safety; He exhibits short step length, narrow base of support and decreased gait speed. Patient exhibits poor standing balance. He would benefit from skilled PT intervention to improve strength, balance and gait safety; Recommend home health PT upon discharge to address these goals;     Follow Up Recommendations Home health PT    Equipment Recommendations  None recommended by PT    Recommendations for Other Services       Precautions / Restrictions Precautions Precautions: Fall Restrictions Weight Bearing Restrictions: No      Mobility  Bed Mobility Overal bed mobility: Modified Independent             General bed mobility comments: uses bed rail and elevated head of bed but able to exhibit good safety awareness and positioning;   Transfers Overall transfer level: Needs assistance Equipment used: None Transfers: Sit to/from Stand Sit to Stand: Min guard         General transfer  comment: with cues for hand placement;   Ambulation/Gait Ambulation/Gait assistance: Min guard Ambulation Distance (Feet): 160 Feet Assistive device: None Gait Pattern/deviations: Step-through pattern;Decreased step length - right;Decreased step length - left;Decreased stride length;Decreased dorsiflexion - right;Narrow base of support;Trunk flexed Gait velocity: decreased   General Gait Details: decreased hip flexion during swing for shorter step length; slower gait speed but with cues able to increase speed with less lateral sway; does not veer with walking; will occasionally reach out for handrail/furniture for safety;   Stairs            Wheelchair Mobility    Modified Rankin (Stroke Patients Only)       Balance Overall balance assessment: Needs assistance Sitting-balance support: No upper extremity supported;Feet supported Sitting balance-Leahy Scale: Poor Sitting balance - Comments: dynamic balance is poor- heavy posterior lean with LE movement; not aware of posterior lean;  Postural control: Posterior lean Standing balance support: No upper extremity supported;During functional activity Standing balance-Leahy Scale: Fair Standing balance comment: static standing balance is fair,able to stand with supervision; however dynamic balance is poor requiring CGA for safety and balance especially with turns/directional change;                              Pertinent Vitals/Pain Pain Assessment: No/denies pain    Home Living Family/patient expects to be discharged to:: Private residence Living Arrangements: Alone Available Help at Discharge: Family;Available 24 hours/day Type of Home: House Home Access: Stairs to enter;Ramped entrance Entrance Stairs-Rails: Right Entrance Stairs-Number  of Steps: 2 Home Layout: One level Home Equipment: Cane - single point;Walker - 2 wheels;Wheelchair - power;Shower seat      Prior Function Level of Independence: Needs  assistance   Gait / Transfers Assistance Needed: patient was walking independently up until Christmas; a few weeks ago started getting weaker; family tried to get him to use a cane but he was inconsistent;   ADL's / Homemaking Assistance Needed: patient was independent in bathing/dressing up until Christmas; Family reports last few weeks he has needed help with bathing and with dressing; they do most of the cooking and housework;         Hand Dominance        Extremity/Trunk Assessment   Upper Extremity Assessment Upper Extremity Assessment: Overall WFL for tasks assessed    Lower Extremity Assessment Lower Extremity Assessment: RLE deficits/detail;LLE deficits/detail RLE Deficits / Details: ROM is WFL, strength grossly 3+/5, intact light touch sensation per patient;  LLE Deficits / Details: ROM is WFL, strength grossly 3+/5, intact light touch sensation per patient;     Cervical / Trunk Assessment Cervical / Trunk Assessment: Kyphotic  Communication   Communication: HOH  Cognition Arousal/Alertness: Awake/alert   Overall Cognitive Status: History of cognitive impairments - at baseline                                 General Comments: patient able to answer questions about home but is slow to respond and requires clarifcation from family; he is oriented to self and family; not to situation;       General Comments General comments (skin integrity, edema, etc.): skin intact; no swelling;     Exercises     Assessment/Plan    PT Assessment Patient needs continued PT services  PT Problem List Decreased strength;Decreased activity tolerance;Decreased knowledge of use of DME;Decreased balance;Decreased safety awareness;Decreased mobility       PT Treatment Interventions DME instruction;Therapeutic activities;Gait training;Therapeutic exercise;Stair training;Balance training;Functional mobility training;Neuromuscular re-education;Patient/family education     PT Goals (Current goals can be found in the Care Plan section)  Acute Rehab PT Goals Patient Stated Goal: "I want to go home."  PT Goal Formulation: With patient Time For Goal Achievement: 07/12/17 Potential to Achieve Goals: Good    Frequency Min 2X/week   Barriers to discharge   none    Co-evaluation               AM-PAC PT "6 Clicks" Daily Activity  Outcome Measure Difficulty turning over in bed (including adjusting bedclothes, sheets and blankets)?: A Little Difficulty moving from lying on back to sitting on the side of the bed? : A Little Difficulty sitting down on and standing up from a chair with arms (e.g., wheelchair, bedside commode, etc,.)?: Unable Help needed moving to and from a bed to chair (including a wheelchair)?: A Little Help needed walking in hospital room?: A Little Help needed climbing 3-5 steps with a railing? : A Little 6 Click Score: 16    End of Session Equipment Utilized During Treatment: Gait belt Activity Tolerance: Patient tolerated treatment well;No increased pain Patient left: in chair;with chair alarm set;with call bell/phone within reach;with family/visitor present Nurse Communication: Mobility status PT Visit Diagnosis: Unsteadiness on feet (R26.81);Muscle weakness (generalized) (M62.81)    Time: [id number] PT Time Calculation (min) (ACUTE ONLY): 12 min   Charges:   PT Evaluation $PT Eval Low Complexity: 1 Low  PT G Codes:          Trotter,Margaret PT, DPT 06/28/2017, 11:54 AM

## 2017-06-30 ENCOUNTER — Ambulatory Visit: Payer: Self-pay | Admitting: Family Medicine

## 2017-06-30 ENCOUNTER — Telehealth: Payer: Self-pay | Admitting: *Deleted

## 2017-06-30 DIAGNOSIS — R627 Adult failure to thrive: Secondary | ICD-10-CM | POA: Diagnosis not present

## 2017-06-30 DIAGNOSIS — E1122 Type 2 diabetes mellitus with diabetic chronic kidney disease: Secondary | ICD-10-CM | POA: Diagnosis not present

## 2017-06-30 DIAGNOSIS — Z681 Body mass index (BMI) 19 or less, adult: Secondary | ICD-10-CM | POA: Diagnosis not present

## 2017-06-30 DIAGNOSIS — M1991 Primary osteoarthritis, unspecified site: Secondary | ICD-10-CM | POA: Diagnosis not present

## 2017-06-30 DIAGNOSIS — N400 Enlarged prostate without lower urinary tract symptoms: Secondary | ICD-10-CM | POA: Diagnosis not present

## 2017-06-30 DIAGNOSIS — F0390 Unspecified dementia without behavioral disturbance: Secondary | ICD-10-CM | POA: Diagnosis not present

## 2017-06-30 DIAGNOSIS — E7800 Pure hypercholesterolemia, unspecified: Secondary | ICD-10-CM | POA: Diagnosis not present

## 2017-06-30 DIAGNOSIS — Z87891 Personal history of nicotine dependence: Secondary | ICD-10-CM | POA: Diagnosis not present

## 2017-06-30 DIAGNOSIS — I129 Hypertensive chronic kidney disease with stage 1 through stage 4 chronic kidney disease, or unspecified chronic kidney disease: Secondary | ICD-10-CM | POA: Diagnosis not present

## 2017-06-30 DIAGNOSIS — N183 Chronic kidney disease, stage 3 (moderate): Secondary | ICD-10-CM | POA: Diagnosis not present

## 2017-06-30 NOTE — Telephone Encounter (Signed)
Received call from [facility].  Reports that patient has been discharged home with Palliative Care Consult.  MD made aware.

## 2017-07-01 DIAGNOSIS — F0390 Unspecified dementia without behavioral disturbance: Secondary | ICD-10-CM | POA: Diagnosis not present

## 2017-07-01 DIAGNOSIS — R627 Adult failure to thrive: Secondary | ICD-10-CM | POA: Diagnosis not present

## 2017-07-01 DIAGNOSIS — N400 Enlarged prostate without lower urinary tract symptoms: Secondary | ICD-10-CM | POA: Diagnosis not present

## 2017-07-01 DIAGNOSIS — I129 Hypertensive chronic kidney disease with stage 1 through stage 4 chronic kidney disease, or unspecified chronic kidney disease: Secondary | ICD-10-CM | POA: Diagnosis not present

## 2017-07-01 DIAGNOSIS — E1122 Type 2 diabetes mellitus with diabetic chronic kidney disease: Secondary | ICD-10-CM | POA: Diagnosis not present

## 2017-07-01 DIAGNOSIS — N183 Chronic kidney disease, stage 3 (moderate): Secondary | ICD-10-CM | POA: Diagnosis not present

## 2017-07-02 LAB — CULTURE, BLOOD (ROUTINE X 2): Culture: NO GROWTH

## 2017-07-06 ENCOUNTER — Other Ambulatory Visit: Payer: Self-pay

## 2017-07-06 ENCOUNTER — Encounter: Payer: Self-pay | Admitting: Family Medicine

## 2017-07-06 ENCOUNTER — Ambulatory Visit (INDEPENDENT_AMBULATORY_CARE_PROVIDER_SITE_OTHER): Payer: Medicare Other | Admitting: Family Medicine

## 2017-07-06 VITALS — BP 138/66 | HR 54 | Temp 98.1°F | Resp 18 | Ht 71.0 in | Wt 149.6 lb

## 2017-07-06 DIAGNOSIS — E11649 Type 2 diabetes mellitus with hypoglycemia without coma: Secondary | ICD-10-CM | POA: Diagnosis not present

## 2017-07-06 DIAGNOSIS — R627 Adult failure to thrive: Secondary | ICD-10-CM

## 2017-07-06 DIAGNOSIS — N183 Chronic kidney disease, stage 3 (moderate): Secondary | ICD-10-CM | POA: Diagnosis not present

## 2017-07-06 DIAGNOSIS — R4189 Other symptoms and signs involving cognitive functions and awareness: Secondary | ICD-10-CM | POA: Diagnosis not present

## 2017-07-06 DIAGNOSIS — N179 Acute kidney failure, unspecified: Secondary | ICD-10-CM

## 2017-07-06 DIAGNOSIS — I10 Essential (primary) hypertension: Secondary | ICD-10-CM | POA: Diagnosis not present

## 2017-07-06 DIAGNOSIS — R6251 Failure to thrive (child): Secondary | ICD-10-CM

## 2017-07-06 LAB — CBC WITH DIFFERENTIAL/PLATELET
BASOS PCT: 0.2 %
Basophils Absolute: 12 cells/uL (ref 0–200)
EOS ABS: 108 {cells}/uL (ref 15–500)
Eosinophils Relative: 1.8 %
HEMATOCRIT: 32.6 % — AB (ref 38.5–50.0)
HEMOGLOBIN: 11 g/dL — AB (ref 13.2–17.1)
Lymphs Abs: 1398 cells/uL (ref 850–3900)
MCH: 30.4 pg (ref 27.0–33.0)
MCHC: 33.7 g/dL (ref 32.0–36.0)
MCV: 90.1 fL (ref 80.0–100.0)
MPV: 11.2 fL (ref 7.5–12.5)
Monocytes Relative: 8.1 %
Neutro Abs: 3996 cells/uL (ref 1500–7800)
Neutrophils Relative %: 66.6 %
PLATELETS: 147 10*3/uL (ref 140–400)
RBC: 3.62 10*6/uL — AB (ref 4.20–5.80)
RDW: 12.6 % (ref 11.0–15.0)
TOTAL LYMPHOCYTE: 23.3 %
WBC: 6 10*3/uL (ref 3.8–10.8)
WBCMIX: 486 {cells}/uL (ref 200–950)

## 2017-07-06 LAB — BASIC METABOLIC PANEL
BUN / CREAT RATIO: 17 (calc) (ref 6–22)
BUN: 19 mg/dL (ref 7–25)
CHLORIDE: 105 mmol/L (ref 98–110)
CO2: 30 mmol/L (ref 20–32)
CREATININE: 1.12 mg/dL — AB (ref 0.70–1.11)
Calcium: 9.2 mg/dL (ref 8.6–10.3)
Glucose, Bld: 344 mg/dL — ABNORMAL HIGH (ref 65–99)
POTASSIUM: 4.6 mmol/L (ref 3.5–5.3)
Sodium: 140 mmol/L (ref 135–146)

## 2017-07-06 MED ORDER — GLIPIZIDE 10 MG PO TABS: ORAL_TABLET | ORAL | 3 refills | Status: DC

## 2017-07-06 NOTE — Patient Instructions (Addendum)
Take the glipizide 5mg  once a day  ( 1/2 tablet of the  10mg  )  Call me with blood sugar and blood pressure readings in 2 weeks  F/U 1 month

## 2017-07-06 NOTE — Assessment & Plan Note (Signed)
Due to renal failure, will hold metformin Restart glipizide at 5mg  once a day  They will call with CBG readings in 2 weeks

## 2017-07-06 NOTE — Progress Notes (Signed)
   Subjective:    Patient ID: Ethan Davis, male    DOB: 07/20/1926, 84 y.o.   MRN: [id number]  Patient presents for Hosptial F/U (ARF/ Hypotension/ FTT- went home on palliative care) and Elevated FSBS (increased appetite with Remeron- CBG this AM >400.)     Jan 9th weight was  138lbs, noted to have weight loss with decreased intake. Labs down unremarkable, except uncontrolled DM, CT done, no malignancy or bowel findings on 1/16.  4 days later he became weaker and would not eat and was admitted with  failture to thrive , dehydration and acute renal failure. Palliative care was called in, however when they evaluated in the home as well as PT, he did not meet criteria. He was up moving around, back on his regular schedule, eating without difficulty, weight up 10lbs  He is on remeron 7.5mg  daily    DM- his blood sugar was  500 this weekend, his A1C was  12.2%, this morning  413 He was taken off all diabetes meds and his lisinopril HCTZ   Urinating and BM at baseline   Children are staying with him until around 10pm and return in the morning   Review Of Systems:  GEN- denies fatigue, fever, weight loss,weakness, recent illness HEENT- denies eye drainage, change in vision, nasal discharge, CVS- denies chest pain, palpitations RESP- denies SOB, cough, wheeze ABD- denies N/V, change in stools, abd pain GU- denies dysuria, hematuria, dribbling, incontinence MSK- denies joint pain, muscle aches, injury Neuro- denies headache, dizziness, syncope, seizure activity       Objective:    BP 138/66   Pulse (!) 54   Temp 98.1 F (36.7 C) (Oral)   Resp 18   Ht 5\' 11"  (1.803 m)   Wt 149 lb 9.6 oz (67.9 kg)   SpO2 97%   BMI 20.86 kg/m  GEN- NAD, alert and oriented x3,well appearing HEENT- PERRL, EOMI, non injected sclera, pink conjunctiva, MMM, oropharynx clear CVS- RRR, no murmur RESP-CTAB ABD-NABS,soft,NT,ND EXT- No edema Pulses- Radial  2+        Assessment & Plan:       Problem List Items Addressed This Visit      Unprioritized   Failure to thrive (0-17)   Essential hypertension, benign    Well controlled , will hold off lisinopril HCTZ      Relevant Orders   Basic metabolic panel   Diabetes mellitus type II, uncontrolled (HCC)    Due to renal failure, will hold metformin Restart glipizide at 5mg  once a day  They will call with CBG readings in 2 weeks       Relevant Medications   glipiZIDE (GLUCOTROL) 10 MG tablet   Cognitive impairment    Improved appetite, children also watching him and providing meals, boost. Does not qualify for palliative care at this time and does not need PT      Acute on chronic renal failure (HCC) - Primary    Recheck renal function      Relevant Orders   CBC with Differential/Platelet   Basic metabolic panel      Note: This dictation was prepared with Dragon dictation along with smaller phrase technology. Any transcriptional errors that result from this process are unintentional.

## 2017-07-06 NOTE — Assessment & Plan Note (Signed)
Recheck renal function. ?

## 2017-07-06 NOTE — Assessment & Plan Note (Signed)
Improved appetite, children also watching him and providing meals, boost. Does not qualify for palliative care at this time and does not need PT

## 2017-07-06 NOTE — Assessment & Plan Note (Signed)
Well controlled , will hold off lisinopril HCTZ

## 2017-07-09 DIAGNOSIS — F0390 Unspecified dementia without behavioral disturbance: Secondary | ICD-10-CM | POA: Diagnosis not present

## 2017-07-09 DIAGNOSIS — N183 Chronic kidney disease, stage 3 (moderate): Secondary | ICD-10-CM | POA: Diagnosis not present

## 2017-07-09 DIAGNOSIS — R627 Adult failure to thrive: Secondary | ICD-10-CM | POA: Diagnosis not present

## 2017-07-09 DIAGNOSIS — N400 Enlarged prostate without lower urinary tract symptoms: Secondary | ICD-10-CM | POA: Diagnosis not present

## 2017-07-09 DIAGNOSIS — I129 Hypertensive chronic kidney disease with stage 1 through stage 4 chronic kidney disease, or unspecified chronic kidney disease: Secondary | ICD-10-CM | POA: Diagnosis not present

## 2017-07-09 DIAGNOSIS — E1122 Type 2 diabetes mellitus with diabetic chronic kidney disease: Secondary | ICD-10-CM | POA: Diagnosis not present

## 2017-07-17 DIAGNOSIS — I129 Hypertensive chronic kidney disease with stage 1 through stage 4 chronic kidney disease, or unspecified chronic kidney disease: Secondary | ICD-10-CM | POA: Diagnosis not present

## 2017-07-17 DIAGNOSIS — N183 Chronic kidney disease, stage 3 (moderate): Secondary | ICD-10-CM | POA: Diagnosis not present

## 2017-07-17 DIAGNOSIS — F0390 Unspecified dementia without behavioral disturbance: Secondary | ICD-10-CM | POA: Diagnosis not present

## 2017-07-17 DIAGNOSIS — N400 Enlarged prostate without lower urinary tract symptoms: Secondary | ICD-10-CM | POA: Diagnosis not present

## 2017-07-17 DIAGNOSIS — R627 Adult failure to thrive: Secondary | ICD-10-CM | POA: Diagnosis not present

## 2017-07-17 DIAGNOSIS — E1122 Type 2 diabetes mellitus with diabetic chronic kidney disease: Secondary | ICD-10-CM | POA: Diagnosis not present

## 2017-07-20 DIAGNOSIS — N183 Chronic kidney disease, stage 3 (moderate): Secondary | ICD-10-CM | POA: Diagnosis not present

## 2017-07-20 DIAGNOSIS — R630 Anorexia: Secondary | ICD-10-CM | POA: Diagnosis not present

## 2017-07-20 DIAGNOSIS — R410 Disorientation, unspecified: Secondary | ICD-10-CM | POA: Diagnosis not present

## 2017-07-20 DIAGNOSIS — Z515 Encounter for palliative care: Secondary | ICD-10-CM | POA: Diagnosis not present

## 2017-07-20 DIAGNOSIS — F0390 Unspecified dementia without behavioral disturbance: Secondary | ICD-10-CM | POA: Diagnosis not present

## 2017-07-21 DIAGNOSIS — R627 Adult failure to thrive: Secondary | ICD-10-CM | POA: Diagnosis not present

## 2017-07-21 DIAGNOSIS — E1122 Type 2 diabetes mellitus with diabetic chronic kidney disease: Secondary | ICD-10-CM | POA: Diagnosis not present

## 2017-07-21 DIAGNOSIS — N183 Chronic kidney disease, stage 3 (moderate): Secondary | ICD-10-CM | POA: Diagnosis not present

## 2017-07-21 DIAGNOSIS — F0390 Unspecified dementia without behavioral disturbance: Secondary | ICD-10-CM | POA: Diagnosis not present

## 2017-07-21 DIAGNOSIS — I129 Hypertensive chronic kidney disease with stage 1 through stage 4 chronic kidney disease, or unspecified chronic kidney disease: Secondary | ICD-10-CM | POA: Diagnosis not present

## 2017-07-21 DIAGNOSIS — N400 Enlarged prostate without lower urinary tract symptoms: Secondary | ICD-10-CM | POA: Diagnosis not present

## 2017-07-28 ENCOUNTER — Other Ambulatory Visit: Payer: Self-pay | Admitting: Family Medicine

## 2017-07-28 DIAGNOSIS — E1122 Type 2 diabetes mellitus with diabetic chronic kidney disease: Secondary | ICD-10-CM | POA: Diagnosis not present

## 2017-07-28 DIAGNOSIS — N183 Chronic kidney disease, stage 3 (moderate): Secondary | ICD-10-CM | POA: Diagnosis not present

## 2017-07-28 DIAGNOSIS — I129 Hypertensive chronic kidney disease with stage 1 through stage 4 chronic kidney disease, or unspecified chronic kidney disease: Secondary | ICD-10-CM | POA: Diagnosis not present

## 2017-07-28 DIAGNOSIS — N400 Enlarged prostate without lower urinary tract symptoms: Secondary | ICD-10-CM | POA: Diagnosis not present

## 2017-07-28 DIAGNOSIS — R627 Adult failure to thrive: Secondary | ICD-10-CM | POA: Diagnosis not present

## 2017-07-28 DIAGNOSIS — F0390 Unspecified dementia without behavioral disturbance: Secondary | ICD-10-CM | POA: Diagnosis not present

## 2017-08-04 DIAGNOSIS — N400 Enlarged prostate without lower urinary tract symptoms: Secondary | ICD-10-CM | POA: Diagnosis not present

## 2017-08-04 DIAGNOSIS — I129 Hypertensive chronic kidney disease with stage 1 through stage 4 chronic kidney disease, or unspecified chronic kidney disease: Secondary | ICD-10-CM | POA: Diagnosis not present

## 2017-08-04 DIAGNOSIS — N183 Chronic kidney disease, stage 3 (moderate): Secondary | ICD-10-CM | POA: Diagnosis not present

## 2017-08-04 DIAGNOSIS — F0390 Unspecified dementia without behavioral disturbance: Secondary | ICD-10-CM | POA: Diagnosis not present

## 2017-08-04 DIAGNOSIS — R627 Adult failure to thrive: Secondary | ICD-10-CM | POA: Diagnosis not present

## 2017-08-04 DIAGNOSIS — E1122 Type 2 diabetes mellitus with diabetic chronic kidney disease: Secondary | ICD-10-CM | POA: Diagnosis not present

## 2017-08-05 ENCOUNTER — Telehealth: Payer: Self-pay | Admitting: *Deleted

## 2017-08-05 NOTE — Telephone Encounter (Signed)
noted 

## 2017-08-05 NOTE — Telephone Encounter (Signed)
[facility].   Reports that patient has reached all goals and [facility]   MD to be made aware.

## 2017-08-07 ENCOUNTER — Other Ambulatory Visit: Payer: Self-pay

## 2017-08-07 ENCOUNTER — Ambulatory Visit (INDEPENDENT_AMBULATORY_CARE_PROVIDER_SITE_OTHER): Payer: Medicare Other | Admitting: Family Medicine

## 2017-08-07 ENCOUNTER — Encounter: Payer: Self-pay | Admitting: Family Medicine

## 2017-08-07 VITALS — BP 128/64 | HR 58 | Temp 97.9°F | Resp 16 | Ht 71.0 in | Wt 155.0 lb

## 2017-08-07 DIAGNOSIS — E11649 Type 2 diabetes mellitus with hypoglycemia without coma: Secondary | ICD-10-CM | POA: Diagnosis not present

## 2017-08-07 DIAGNOSIS — I10 Essential (primary) hypertension: Secondary | ICD-10-CM

## 2017-08-07 DIAGNOSIS — R6251 Failure to thrive (child): Secondary | ICD-10-CM

## 2017-08-07 LAB — HEMOGLOBIN A1C, FINGERSTICK: HEMOGLOBIN A1C, FINGERSTICK: 10.5 %{Hb} — AB (ref <6.0)

## 2017-08-07 MED ORDER — DULAGLUTIDE 0.75 MG/0.5ML ~~LOC~~ SOAJ: SUBCUTANEOUS | 1 refills | Status: DC

## 2017-08-07 NOTE — Assessment & Plan Note (Signed)
Well controlled no changes 

## 2017-08-07 NOTE — Assessment & Plan Note (Addendum)
Now hisdiabetes is further uncontrolled but more consistent with medications  A1C done in office today  Down to 10.5%, but still quite elevated Considered use of Januvia however and I think this would give as much drop as needed SGLT-2 however his renal function concerns me Will try Trulicty as once a week, daughter feels he can handle weekly injection instead of daily and they can give to him

## 2017-08-07 NOTE — Progress Notes (Signed)
   Subjective:    Patient ID: Ethan Davis, male    DOB: May 11, 1927, 84 y.o.   MRN: [id number]  Patient presents for Follow-up (is not fasting)   Weight at home 160lbs has been steady  Appetite is much improved on remeron 7.5mg  he is hungry in the morning.    Diabetes- CBG up to 300's -400's he often eats peanut butter and crackes all day long for snacks between his meals  CBG this morning wsa  324 Last A1C was 12.2% Last visit Glipizide 5mg  once a day was started, now on 10mg  once a day CBG still in 300's  No other concerns   No longer needs HH, palliative, home nurse  No falls   Review Of Systems:  GEN- denies fatigue, fever, weight loss,weakness, recent illness HEENT- denies eye drainage, change in vision, nasal discharge, CVS- denies chest pain, palpitations RESP- denies SOB, cough, wheeze ABD- denies N/V, change in stools, abd pain GU- denies dysuria, hematuria, dribbling, incontinence MSK- denies joint pain, muscle aches, injury Neuro- denies headache, dizziness, syncope, seizure activity       Objective:    BP 128/64   Pulse (!) 58   Temp 97.9 F (36.6 C) (Oral)   Resp 16   Ht 5\' 11"  (1.803 m)   Wt 155 lb (70.3 kg)   SpO2 97%   BMI 21.62 kg/m  GEN- NAD, alert and oriented x3 HEENT- PERRL, EOMI, non injected sclera, pink conjunctiva, MMM, oropharynx clear Neck- Supple, no thyromegaly CVS- mild bradycardia HR 60's, no murmur RESP-CTAB ABD-NABS,soft,NT,ND EXT- No edema Pulses- Radial 2+        Assessment & Plan:      Problem List Items Addressed This Visit      Unprioritized   Failure to thrive (0-17)    Improved weight with remeron and appetite       Essential hypertension, benign    Well controlled no changes      Diabetes mellitus type II, uncontrolled (HCC) - Primary    Now hisdiabetes is further uncontrolled but more consistent with medications  A1C done in office today  Down to 10.5%, but still quite elevated Considered use of  Januvia however and I think this would give as much drop as needed SGLT-2 however his renal function concerns me Will try Trulicty as once a week, daughter feels he can handle weekly injection instead of daily and they can give to him      Relevant Medications   Dulaglutide (TRULICITY) 0.75 MG/0.5ML SOPN   Other Relevant Orders   Hemoglobin A1C, fingerstick (Completed)      Note: This dictation was prepared with Dragon dictation along with smaller phrase technology. Any transcriptional errors that result from this process are unintentional.

## 2017-08-07 NOTE — Patient Instructions (Signed)
F/U 3 months Call with blood sugar readings in 2 weeks

## 2017-08-07 NOTE — Assessment & Plan Note (Signed)
Improved weight with remeron and appetite

## 2017-09-11 ENCOUNTER — Ambulatory Visit: Payer: Medicare Other | Admitting: Podiatry

## 2017-09-28 ENCOUNTER — Ambulatory Visit: Payer: Medicare Other | Admitting: Family Medicine

## 2017-10-01 ENCOUNTER — Other Ambulatory Visit: Payer: Self-pay | Admitting: Family Medicine

## 2017-10-05 ENCOUNTER — Ambulatory Visit: Payer: Medicare Other | Admitting: Family Medicine

## 2017-10-18 ENCOUNTER — Other Ambulatory Visit: Payer: Self-pay | Admitting: Family Medicine

## 2017-11-09 ENCOUNTER — Other Ambulatory Visit: Payer: Self-pay

## 2017-11-09 ENCOUNTER — Encounter: Payer: Self-pay | Admitting: Family Medicine

## 2017-11-09 ENCOUNTER — Ambulatory Visit (INDEPENDENT_AMBULATORY_CARE_PROVIDER_SITE_OTHER): Payer: Medicare Other | Admitting: Family Medicine

## 2017-11-09 VITALS — BP 122/80 | HR 60 | Temp 97.9°F | Resp 16 | Ht 71.0 in | Wt 166.0 lb

## 2017-11-09 DIAGNOSIS — E7800 Pure hypercholesterolemia, unspecified: Secondary | ICD-10-CM | POA: Diagnosis not present

## 2017-11-09 DIAGNOSIS — E1121 Type 2 diabetes mellitus with diabetic nephropathy: Secondary | ICD-10-CM | POA: Diagnosis not present

## 2017-11-09 DIAGNOSIS — E441 Mild protein-calorie malnutrition: Secondary | ICD-10-CM

## 2017-11-09 DIAGNOSIS — I10 Essential (primary) hypertension: Secondary | ICD-10-CM

## 2017-11-09 DIAGNOSIS — E11649 Type 2 diabetes mellitus with hypoglycemia without coma: Secondary | ICD-10-CM

## 2017-11-09 DIAGNOSIS — E46 Unspecified protein-calorie malnutrition: Secondary | ICD-10-CM | POA: Insufficient documentation

## 2017-11-09 NOTE — Assessment & Plan Note (Signed)
Goal A1C 8% I think he will need daily insulin, in that case will d/c glipizide

## 2017-11-09 NOTE — Assessment & Plan Note (Signed)
Controlled no changes 

## 2017-11-09 NOTE — Assessment & Plan Note (Signed)
LDL 100, inDec, with age, no change to meds

## 2017-11-09 NOTE — Assessment & Plan Note (Addendum)
Continue remeron, unfortunately he does have taste for sweets, driving his glucose up

## 2017-11-09 NOTE — Progress Notes (Signed)
   Subjective:    Patient ID: Ethan Davis, male    DOB: 1926/10/13, 84 y.o.   MRN: [id number]  Patient presents for Follow-up (is not fasting )  DM- glipizide 10mg  daily, Trulicity 0.75mg  once a week, CBG still very elevated, has levels back to March, typically not fasting but in low 200's, evening after meals up to 300's. No hypoglycemia symptoms   Last A1C 10.5% Feels good, has good appetite, urinating normally       Review Of Systems:  GEN- denies fatigue, fever, weight loss,weakness, recent illness HEENT- denies eye drainage, change in vision, nasal discharge, CVS- denies chest pain, palpitations RESP- denies SOB, cough, wheeze ABD- denies N/V, change in stools, abd pain GU- denies dysuria, hematuria, dribbling, incontinence MSK- denies joint pain, muscle aches, injury Neuro- denies headache, dizziness, syncope, seizure activity       Objective:    BP 122/80   Pulse 60   Temp 97.9 F (36.6 C) (Oral)   Resp 16   Ht 5\' 11"  (1.803 m)   Wt 166 lb (75.3 kg)   SpO2 95%   BMI 23.15 kg/m  GEN- NAD, alert and oriented x3 HEENT- PERRL, EOMI, non injected sclera, pink conjunctiva, MMM, oropharynx clear Neck- Supple, no thyromegaly CVS- RRR, no murmur RESP-CTAB ABD-NABS,soft,NT,ND EXT- No edema Pulses- Radial, DP- 2+        Assessment & Plan:      Problem List Items Addressed This Visit      Unprioritized   Type 2 diabetes with nephropathy (HCC) - Primary    Goal A1C 8% I think he will need daily insulin, in that case will d/c glipizide       Protein-calorie malnutrition (HCC)    Continue remeron, unfortunately he does have taste for sweets, driving his glucose up      Hyperlipidemia    LDL 100, inDec, with age, no change to meds      Essential hypertension, benign    Controlled no changes         Note: This dictation was prepared with Dragon dictation along with smaller phrase technology. Any transcriptional errors that result from this process  are unintentional.

## 2017-11-09 NOTE — Patient Instructions (Signed)
F/U 3 months  We will call with lab results   

## 2017-11-10 LAB — HEMOGLOBIN A1C
HEMOGLOBIN A1C: 11.7 %{Hb} — AB (ref <5.7)
Mean Plasma Glucose: 289 (calc)
eAG (mmol/L): 16 (calc)

## 2017-11-10 LAB — BASIC METABOLIC PANEL
BUN / CREAT RATIO: 15 (calc) (ref 6–22)
BUN: 17 mg/dL (ref 7–25)
CALCIUM: 9.6 mg/dL (ref 8.6–10.3)
CO2: 28 mmol/L (ref 20–32)
Chloride: 102 mmol/L (ref 98–110)
Creat: 1.16 mg/dL — ABNORMAL HIGH (ref 0.70–1.11)
Glucose, Bld: 319 mg/dL — ABNORMAL HIGH (ref 65–99)
Potassium: 4.4 mmol/L (ref 3.5–5.3)
SODIUM: 139 mmol/L (ref 135–146)

## 2017-11-11 ENCOUNTER — Other Ambulatory Visit: Payer: Self-pay | Admitting: *Deleted

## 2017-11-11 MED ORDER — INSULIN PEN NEEDLE 32G X 4 MM MISC: 1 refills | Status: DC

## 2017-11-11 MED ORDER — INSULIN GLARGINE 100 UNIT/ML SOLOSTAR PEN: 5.0000 [IU] | PEN_INJECTOR | Freq: Every day | SUBCUTANEOUS | 99 refills | Status: DC

## 2017-11-11 MED ORDER — GLIPIZIDE 10 MG PO TABS: ORAL_TABLET | ORAL | 3 refills | Status: DC

## 2017-12-07 ENCOUNTER — Encounter: Payer: Self-pay | Admitting: Family Medicine

## 2017-12-07 ENCOUNTER — Ambulatory Visit (INDEPENDENT_AMBULATORY_CARE_PROVIDER_SITE_OTHER): Payer: Medicare Other | Admitting: Family Medicine

## 2017-12-07 ENCOUNTER — Other Ambulatory Visit: Payer: Self-pay

## 2017-12-07 VITALS — BP 128/78 | HR 76 | Temp 98.5°F | Resp 16 | Ht 71.0 in | Wt 168.0 lb

## 2017-12-07 DIAGNOSIS — E1121 Type 2 diabetes mellitus with diabetic nephropathy: Secondary | ICD-10-CM | POA: Diagnosis not present

## 2017-12-07 LAB — BASIC METABOLIC PANEL
BUN: 17 mg/dL (ref 7–25)
CHLORIDE: 99 mmol/L (ref 98–110)
CO2: 27 mmol/L (ref 20–32)
CREATININE: 1.11 mg/dL (ref 0.70–1.11)
Calcium: 9.6 mg/dL (ref 8.6–10.3)
Glucose, Bld: 467 mg/dL — ABNORMAL HIGH (ref 65–99)
POTASSIUM: 4.4 mmol/L (ref 3.5–5.3)
Sodium: 134 mmol/L — ABNORMAL LOW (ref 135–146)

## 2017-12-07 LAB — GLUCOSE 16585: Glucose: >444 mg/dL — ABNORMAL HIGH (ref 65–99)

## 2017-12-07 MED ORDER — LANCETS MISC: 1 refills | Status: DC

## 2017-12-07 MED ORDER — BLOOD GLUCOSE SYSTEM PAK KIT: PACK | 1 refills | Status: DC

## 2017-12-07 MED ORDER — LANCET DEVICES MISC: 1 refills | Status: DC

## 2017-12-07 MED ORDER — GLIPIZIDE 10 MG PO TABS: ORAL_TABLET | ORAL | 3 refills | Status: DC

## 2017-12-07 MED ORDER — BLOOD GLUCOSE TEST VI STRP: ORAL_STRIP | 1 refills | Status: DC

## 2017-12-07 NOTE — Assessment & Plan Note (Signed)
Uncontrolled diabetes mellitus.  He is maintaining his weight.  His appetite is good.  Surprisingly with his blood sugar so elevated he has not felt sick.  As he recently turned 84 years old in his current state I think the best to keep his blood sugar around 200.  His daughter gives him his medication and checks of blood sugars.  We will have her check his blood sugar twice a day.  He will continue the glipizide 10 mg in the morning we will increase his Lantus to 10 units at bedtime.  We will try to get a fingerstick glucose but was unable to get so in the office so I have sent a stat metabolic panel to see what his blood sugar is.He looks well today

## 2017-12-07 NOTE — Patient Instructions (Addendum)
Increase lantus to 10 units at bedtime Check sugar twice a day, morning and evening before 7pm meds  F/U  1 month

## 2017-12-07 NOTE — Progress Notes (Signed)
   Subjective:    Patient ID: Sherian ReinJohn I Warshawsky, male    DOB: Mar 22, 1927, 84 y.o.   MRN: [id number]  Patient presents for Follow-up (is not fasting)   She here to follow-up diabetes mellitus.  Last A1c resulted 11.7% 4 weeks ago.  He was started on Lantus 5 units.  He was also to take glipizide 5 mg with breakfast however his daughter continued to give him 10 mg.  His blood sugars have been significantly elevated even since the addition of insulin.  They often only check fasting this is after he has had his daily Cheerios with milk.  His fasting glucose 2 11-566 on average blood sugars are in the 300s.  They state that he did have one hypoglycemia episode in the evening where it was 98 but he had not had his regular lunch and snack.  He does not complain of anything states that he feels well they have no specific concerns today otherwise.  He does still like to snack on sugary things and cookies.    Review Of Systems:  GEN- denies fatigue, fever, weight loss,weakness, recent illness HEENT- denies eye drainage, change in vision, nasal discharge, CVS- denies chest pain, palpitations RESP- denies SOB, cough, wheeze ABD- denies N/V, change in stools, abd pain Neuro- denies headache, dizziness, syncope, seizure activity       Objective:    BP 128/78   Pulse 76   Temp 98.5 F (36.9 C) (Oral)   Resp 16   Ht 5\' 11"  (1.803 m)   Wt 168 lb (76.2 kg)   SpO2 98%   BMI 23.43 kg/m  GEN- NAD, alert and oriented x3 HEENT- PERRL, EOMI, non injected sclera, pink conjunctiva, MMM, oropharynx clear Neck- Supple, CVS- RRR, no murmur RESP-CTAB ABD-NABS,soft,NT,ND EXT- No edema        Assessment & Plan:      Problem List Items Addressed This Visit      Unprioritized   Type 2 diabetes with nephropathy (HCC) - Primary    Uncontrolled diabetes mellitus.  He is maintaining his weight.  His appetite is good.  Surprisingly with his blood sugar so elevated he has not felt sick.  As he recently turned  84 years old in his current state I think the best to keep his blood sugar around 200.  His daughter gives him his medication and checks of blood sugars.  We will have her check his blood sugar twice a day.  He will continue the glipizide 10 mg in the morning we will increase his Lantus to 10 units at bedtime.  We will try to get a fingerstick glucose but was unable to get so in the office so I have sent a stat metabolic panel to see what his blood sugar is.He looks well today       Relevant Medications   glipiZIDE (GLUCOTROL) 10 MG tablet   Other Relevant Orders   Glucose, fingerstick (stat)   Basic metabolic panel      Note: This dictation was prepared with Dragon dictation along with smaller phrase technology. Any transcriptional errors that result from this process are unintentional.

## 2018-01-08 ENCOUNTER — Encounter

## 2018-01-08 ENCOUNTER — Ambulatory Visit: Payer: Medicare Other | Admitting: Podiatry

## 2018-01-11 ENCOUNTER — Other Ambulatory Visit: Payer: Self-pay

## 2018-01-11 ENCOUNTER — Encounter: Payer: Self-pay | Admitting: Family Medicine

## 2018-01-11 ENCOUNTER — Ambulatory Visit (INDEPENDENT_AMBULATORY_CARE_PROVIDER_SITE_OTHER): Payer: Medicare Other | Admitting: Family Medicine

## 2018-01-11 VITALS — BP 130/76 | HR 68 | Temp 98.3°F | Resp 12 | Ht 71.0 in | Wt 166.0 lb

## 2018-01-11 DIAGNOSIS — N182 Chronic kidney disease, stage 2 (mild): Secondary | ICD-10-CM

## 2018-01-11 DIAGNOSIS — E1121 Type 2 diabetes mellitus with diabetic nephropathy: Secondary | ICD-10-CM | POA: Diagnosis not present

## 2018-01-11 MED ORDER — BLOOD GLUCOSE TEST VI STRP: ORAL_STRIP | 1 refills | Status: DC

## 2018-01-11 NOTE — Progress Notes (Signed)
Subjective:    Patient ID: Ethan Davis, male    DOB: 06-Jul-1926, 84 y.o.   MRN: [id number]  Patient presents for Follow-up (is fasting) Here with his daughter for one-month follow-up on his diabetes mellitus.  His A1c was significantly elevated At 11.7% back in June.  At that time started him on Lantus 5 units.  He had an interim follow-up visit his blood sugars were still quite elevated in the 400s to 500.  His daughter has been given him glipizide 10 mg in the morning which I continued at that visit also recommend he go up to Lantus 10 units however they states when I gave it to him one time he seemed like he was confused so they stopped and then started titrating him up every few days he is currently on 9 units of Lantus and glipizide.  Of note they never called during these episodes they did not check his blood sugar as well during those episodes.  He typically takes glipizide in the morning with breakfast which can range as he eats a lot of sugary snacks a lot of carbs also drinks a lot of milk.  He then takes his Lantus right after dinner which is about 7:00 as he gets in the bed early.  His fasting blood sugars still range from 2 55-300 evening blood sugars the past 2 weeks have been 289-396 dates that he feels well he has not had any hypoglycemia symptoms   Review Of Systems:  GEN- denies fatigue, fever, weight loss,weakness, recent illness HEENT- denies eye drainage, change in vision, nasal discharge, CVS- denies chest pain, palpitations RESP- denies SOB, cough, wheeze ABD- denies N/V, change in stools, abd pain GU- denies dysuria, hematuria, dribbling, incontinence MSK- denies joint pain, muscle aches, injury Neuro- denies headache, dizziness, syncope, seizure activity       Objective:    BP 130/76   Pulse 68   Temp 98.3 F (36.8 C) (Oral)   Resp 12   Ht 5\' 11"  (1.803 m)   Wt 166 lb (75.3 kg)   SpO2 96%   BMI 23.15 kg/m  GEN- NAD, alert and oriented x3 HEENT- PERRL,  EOMI, non injected sclera, pink conjunctiva, MMM, oropharynx clear CVS- RRR, no murmur RESP-CTAB EXT- No edema Pulses- Radial  2+        Assessment & Plan:      Problem List Items Addressed This Visit      Unprioritized   CKD (chronic kidney disease), stage II - Primary   Type 2 diabetes with nephropathy (HCC)    Uncontrolled diabetes though he is asymptomatic.  Change his diet is very difficult as he tends to have lots of sweets and carbs around and he is almost 84 years old.  He is at least not lose any significant weight.  They are currently on 9 units of Lantus we will increase this to 10 units on Wednesday and stay there for 2 weeks and then daughter will send me his blood sugar readings.  I can increase the glipizide because he takes his Lantus so early in the evening due to his early bedtime.  We will slowly try to increase the Lantus as tolerated but I would like to keep his blood sugars at least 200-300 at the max with his age.  Family is in agreement with this plan         Note: This dictation was prepared with Dragon dictation along with smaller phrase technology. Any transcriptional errors that  result from this process are unintentional.

## 2018-01-11 NOTE — Patient Instructions (Signed)
Incresae lantus to 10 units [email or web address]  Send us blood sugar readings  F/U end of November

## 2018-01-11 NOTE — Assessment & Plan Note (Signed)
Uncontrolled diabetes though he is asymptomatic.  Change his diet is very difficult as he tends to have lots of sweets and carbs around and he is almost 84 years old.  He is at least not lose any significant weight.  They are currently on 9 units of Lantus we will increase this to 10 units on Wednesday and stay there for 2 weeks and then daughter will send me his blood sugar readings.  I can increase the glipizide because he takes his Lantus so early in the evening due to his early bedtime.  We will slowly try to increase the Lantus as tolerated but I would like to keep his blood sugars at least 200-300 at the max with his age.  Family is in agreement with this plan

## 2018-02-09 ENCOUNTER — Ambulatory Visit: Payer: Medicare Other | Admitting: Family Medicine

## 2018-02-13 ENCOUNTER — Other Ambulatory Visit: Payer: Self-pay | Admitting: Family Medicine

## 2018-03-02 ENCOUNTER — Other Ambulatory Visit: Payer: Self-pay | Admitting: Family Medicine

## 2018-03-02 DIAGNOSIS — Z23 Encounter for immunization: Secondary | ICD-10-CM | POA: Diagnosis not present

## 2018-03-08 ENCOUNTER — Other Ambulatory Visit: Payer: Self-pay | Admitting: Family Medicine

## 2018-05-03 ENCOUNTER — Other Ambulatory Visit: Payer: Self-pay

## 2018-05-03 ENCOUNTER — Encounter: Payer: Self-pay | Admitting: Family Medicine

## 2018-05-03 ENCOUNTER — Ambulatory Visit (INDEPENDENT_AMBULATORY_CARE_PROVIDER_SITE_OTHER): Payer: Medicare Other | Admitting: Family Medicine

## 2018-05-03 VITALS — BP 128/78 | HR 74 | Temp 97.7°F | Resp 16 | Ht 71.0 in | Wt 170.0 lb

## 2018-05-03 DIAGNOSIS — N400 Enlarged prostate without lower urinary tract symptoms: Secondary | ICD-10-CM

## 2018-05-03 DIAGNOSIS — Z515 Encounter for palliative care: Secondary | ICD-10-CM

## 2018-05-03 DIAGNOSIS — R4189 Other symptoms and signs involving cognitive functions and awareness: Secondary | ICD-10-CM | POA: Diagnosis not present

## 2018-05-03 DIAGNOSIS — N182 Chronic kidney disease, stage 2 (mild): Secondary | ICD-10-CM

## 2018-05-03 DIAGNOSIS — E7800 Pure hypercholesterolemia, unspecified: Secondary | ICD-10-CM | POA: Diagnosis not present

## 2018-05-03 DIAGNOSIS — I10 Essential (primary) hypertension: Secondary | ICD-10-CM

## 2018-05-03 DIAGNOSIS — E1121 Type 2 diabetes mellitus with diabetic nephropathy: Secondary | ICD-10-CM | POA: Diagnosis not present

## 2018-05-03 MED ORDER — DONEPEZIL HCL 10 MG PO TABS: ORAL_TABLET | ORAL | 0 refills | Status: DC

## 2018-05-03 MED ORDER — DONEPEZIL HCL 10 MG PO TABS: ORAL_TABLET | ORAL | 3 refills | Status: DC

## 2018-05-03 NOTE — Assessment & Plan Note (Signed)
Daughter has been thinking about ALF/SNF for him due to age Would [facility] some [facility]

## 2018-05-03 NOTE — Assessment & Plan Note (Signed)
Controlled no changes 

## 2018-05-03 NOTE — Assessment & Plan Note (Signed)
Diabetes uncontrolled, increase lantus to 12 units Would like sugars below 200 consistently No hypoglycemia, doesnot need tight control, prefer A1C around 8%

## 2018-05-03 NOTE — Patient Instructions (Addendum)
Increase lantus to 12 units  [facility] We will call with lab results F/U 4 months

## 2018-05-03 NOTE — Assessment & Plan Note (Signed)
Continue aricept No recent declines

## 2018-05-03 NOTE — Progress Notes (Signed)
   Subjective:    Patient ID: Ethan Davis, male    DOB: [birth year], 84 y.o.   MRN: [id number]  Patient presents for Follow-up (is not fasting)  Pt here to f/u chronic medical problems, here with daughter who care for him   DM- CBG still run 250-300 on average taking glipizide and lantus 10 units,no hypoglycemia symptoms. He continues to snack a lot, weight up a few pounds, He states he feels good, no falls, no recent illness  A1C last 11.7%  Has not had eye exam  Mild dementia- taking aricept, request refill   BPH- urinating with cardura and proscar       Review Of Systems:  GEN- denies fatigue, fever, weight loss,weakness, recent illness HEENT- denies eye drainage, change in vision, nasal discharge, CVS- denies chest pain, palpitations RESP- denies SOB, cough, wheeze ABD- denies N/V, change in stools, abd pain GU- denies dysuria, hematuria, dribbling, incontinence MSK- denies joint pain, muscle aches, injury Neuro- denies headache, dizziness, syncope, seizure activity       Objective:    BP 128/78   Pulse 74   Temp 97.7 F (36.5 C) (Oral)   Resp 16   Ht 5\' 11"  (1.803 m)   Wt 170 lb (77.1 kg)   SpO2 96%   BMI 23.71 kg/m  GEN- NAD, alert and oriented x3 HEENT- PERRL, EOMI, non injected sclera, pink conjunctiva, MMM, oropharynx clear Neck- Supple, no thyromegaly, no LAD CVS- RRR, no murmur RESP-CTAB ABD-NABS,soft,NT,ND EXT- No edema Pulses- Radial2+        Assessment & Plan:      Problem List Items Addressed This Visit      Unprioritized   Benign prostatic hyperplasia    Continue current meds, urination improved with medication      CKD (chronic kidney disease), stage II - Primary   Cognitive impairment    Continue aricept No recent declines      End of life care    Daughter has been thinking about ALF/SNF for him due to age Would like some help with this [facility] referral to SW      Essential hypertension, benign    Controlled no changes      Hyperlipidemia   Relevant Orders   Lipid panel   Type 2 diabetes with nephropathy (HCC)    Diabetes uncontrolled, increase lantus to 12 units Would like sugars below 200 consistently No hypoglycemia, doesnot need tight control, prefer A1C around 8%      Relevant Orders   CBC with Differential/Platelet (Completed)   Comprehensive metabolic panel   Hemoglobin A1c      Note: This dictation was prepared with Dragon dictation along with smaller phrase technology. Any transcriptional errors that result from this process are unintentional.

## 2018-05-03 NOTE — Assessment & Plan Note (Signed)
Continue current meds, urination improved with medication

## 2018-05-04 ENCOUNTER — Other Ambulatory Visit: Payer: Self-pay | Admitting: *Deleted

## 2018-05-04 ENCOUNTER — Other Ambulatory Visit: Payer: Self-pay

## 2018-05-04 LAB — CBC WITH DIFFERENTIAL/PLATELET
BASOS ABS: 30 {cells}/uL (ref 0–200)
Basophils Relative: 0.4 %
EOS ABS: 141 {cells}/uL (ref 15–500)
Eosinophils Relative: 1.9 %
HEMATOCRIT: 41.3 % (ref 38.5–50.0)
Hemoglobin: 13.9 g/dL (ref 13.2–17.1)
Lymphs Abs: 3138 cells/uL (ref 850–3900)
MCH: 29.7 pg (ref 27.0–33.0)
MCHC: 33.7 g/dL (ref 32.0–36.0)
MCV: 88.2 fL (ref 80.0–100.0)
MONOS PCT: 7.7 %
MPV: 11.6 fL (ref 7.5–12.5)
NEUTROS ABS: 3522 {cells}/uL (ref 1500–7800)
NEUTROS PCT: 47.6 %
Platelets: 152 10*3/uL (ref 140–400)
RBC: 4.68 10*6/uL (ref 4.20–5.80)
RDW: 12.7 % (ref 11.0–15.0)
Total Lymphocyte: 42.4 %
WBC mixed population: 570 cells/uL (ref 200–950)
WBC: 7.4 10*3/uL (ref 3.8–10.8)

## 2018-05-04 LAB — LIPID PANEL
Cholesterol: 167 mg/dL (ref <200)
HDL: 68 mg/dL (ref >40)
LDL CHOLESTEROL (CALC): 84 mg/dL
Non-HDL Cholesterol (Calc): 99 mg/dL (calc) (ref <130)
Total CHOL/HDL Ratio: 2.5 (calc) (ref <5.0)
Triglycerides: 72 mg/dL (ref <150)

## 2018-05-04 LAB — HEMOGLOBIN A1C: Hgb A1c MFr Bld: >14 % of total Hgb — ABNORMAL HIGH (ref <5.7)

## 2018-05-04 LAB — COMPREHENSIVE METABOLIC PANEL
AG Ratio: 1.4 (calc) (ref 1.0–2.5)
ALBUMIN MSPROF: 4.2 g/dL (ref 3.6–5.1)
ALT: 8 U/L — ABNORMAL LOW (ref 9–46)
AST: 14 U/L (ref 10–35)
Alkaline phosphatase (APISO): 130 U/L — ABNORMAL HIGH (ref 40–115)
BILIRUBIN TOTAL: 0.7 mg/dL (ref 0.2–1.2)
BUN: 13 mg/dL (ref 7–25)
CHLORIDE: 102 mmol/L (ref 98–110)
CO2: 26 mmol/L (ref 20–32)
CREATININE: 1.06 mg/dL (ref 0.70–1.11)
Calcium: 9.9 mg/dL (ref 8.6–10.3)
GLOBULIN: 2.9 g/dL (ref 1.9–3.7)
Glucose, Bld: 177 mg/dL — ABNORMAL HIGH (ref 65–99)
POTASSIUM: 4.2 mmol/L (ref 3.5–5.3)
SODIUM: 139 mmol/L (ref 135–146)
TOTAL PROTEIN: 7.1 g/dL (ref 6.1–8.1)

## 2018-05-04 MED ORDER — GLUCOSE BLOOD VI STRP: ORAL_STRIP | 0 refills | Status: DC

## 2018-05-04 NOTE — Patient Outreach (Signed)
[facility]) Care Management  05/04/2018  Ethan ReinJohn I Davis 1926-10-04 [id number]   Referral Date: 05/04/18 Referral Source: MD referral Referral Reason: social work referral for future placement   Outreach Attempt: spoke with daughter Ethan AcostaMelvine who is POA for patient.  She is able to verify HIPAA.  She states that she and her 7 other siblings take turns in taking care of patient but she is his main caregiver.  She states that she is wanting information on ALF/SNF placement. She states they are not quite ready yet but she wants to get some information to share with her siblings so they can be prepared.  She requests to be called after December 8th as she will be out of town for the holidays.    Patient has dementia and DM.  Daughter states they try to manage his diabetes but patient snacks constantly.  She states otherwise patient health is very good.    Discussed [facility] services.  She is agreeable to social work referral.     Social:   Conditions:   Medications:   Appointments:    Advanced Directives:   Consent:   Plan:   Ethan Lericheionne J Kehinde Totzke, RN, MSN [facility] Care Management Care Management Coordinator Direct Line [phone] Toll Free: [phone]  Fax: [phone]

## 2018-05-17 ENCOUNTER — Ambulatory Visit: Payer: Self-pay | Admitting: *Deleted

## 2018-05-17 ENCOUNTER — Other Ambulatory Visit: Payer: Self-pay | Admitting: *Deleted

## 2018-05-17 NOTE — Patient Outreach (Signed)
[facility]) Care Management  05/17/2018  Ethan ReinJohn I Davis 10/02/1926 [id number]  Initial contact attempt:  Unsuccessful phone call to patient's daughter and POA Ferdinand LangoMelvine Grice to discuss placement options for patient. Voicemail message left for a return call.   Plan: This social will send contact letter to patient This social worker will make a second attempt to call patient within 3-4 business days.   Adriana ReamsChrystal Land, LCSW [facility] Care Management [phone]

## 2018-05-20 ENCOUNTER — Other Ambulatory Visit: Payer: Self-pay | Admitting: *Deleted

## 2018-05-20 ENCOUNTER — Ambulatory Visit: Payer: Self-pay | Admitting: *Deleted

## 2018-05-20 NOTE — Patient Outreach (Signed)
[facility]) Care Management  05/20/2018  Ethan Davis 12-Mar-1927 [id number]   Patient referred to this social worker to discuss placement options with patient's daughter/POA Ethan Davis . Per patient's daughter, she is not looking for placement just yet but as his condition progresses she would like to know what her options are. Patient currently lives alone and  takes care of his own ADL's but explains that his  issues are more memory related. Patient's family rotates care for patient daily from the hours of 10am-7:30pm. Patient seems to become most agitated at night when he is alone.This Child psychotherapistsocial worker discussed multiple options with patient's daughter including in home care, ALF and nursing home care. This Child psychotherapistsocial worker also discussed the possibility of day programs in [place] where patient resides to increase patient's socialization.  It was further recommended that patient's daughter speak to someone at the Department of Social Services to discuss patient's medicaid eligibility and possible need to spend down some of his assets. Patient's daughter verbalized having no further community resource needs at this time. This Child psychotherapistsocial worker will sign off, however my contact information provided in the even that there is any further questions or concerns.   Ethan ReamsChrystal Land, LCSW [facility] Care Management [phone]   Plan:

## 2018-05-26 ENCOUNTER — Other Ambulatory Visit: Payer: Medicare Other

## 2018-05-26 DIAGNOSIS — R4189 Other symptoms and signs involving cognitive functions and awareness: Secondary | ICD-10-CM

## 2018-05-27 NOTE — Progress Notes (Signed)
PATIENT NAME: Ethan Davis DOB: 1926-07-30 MRN: [id number]  PRIMARY CARE PROVIDER: Salley Scarleturham, Kawanta F, MD  RESPONSIBLE PARTY:  Acct ID - Guarantor Home Phone Work Phone Relationship Acct Type  [id number] Jodi Geralds- Gutt,Donaciano* [phone]  Self P/F     [address] , [place], [place] [phone]    PLAN OF CARE and INTERVENTIONS:               1.  GOALS OF CARE/ ADVANCE CARE PLANNING:To remain at home. Reduce developing infections.               2.  PATIENT/CAREGIVER EDUCATION: Education on fall precautions, Good handwashing/ Education on signs of infection, fever, chills, weakness, cough.               4. PERSONAL EMERGENCY PLAN:  In Place               5.  DISEASE STATUS: Daughter reports increased weakness, sleeping more.      HISTORY OF PRESENT ILLNESS:    CODE STATUS: Full Code  ADVANCED DIRECTIVES:Unknown MOST FORM: Unknown PPS: 40%   PHYSICAL EXAM:   VITALS:There were no vitals filed for this visit.  LUNGS: clear to auscultation  CARDIAC: Cor RRR and No murmurs  EXTREMITIES: Trace edema SKIN:  Warm and dry with lesions NEURO: positive for memory problems and weakness   Palliative Care visit made to assess patient.  Patient's daughter in home with patient.  Family continues to stay with patient during the day.  Patient's memory remain poor.  Patient alert to self and familiars.  Patient denies having any pain.  Patient smiling and pleasant.  Patient's blood sugars continue to range from the mid 200's to over 500.  Patient was started on Solo Star Insulin 11 units in the evening.  Patient remains on Glipizide daily and daughter reports family gives patient extra 1/2 tab of Glipizide when blood sugars are over 250 - 300.  Patient's appetite remains good.  Patient is weak at times and remains at risk for falls.  Patient denies having any shortness of breath.  Daughter reports patient does have a non productive cough.  Patient to see MD again in 3 months.  Support provided to daughter in  managing patient's care.  Daughter encouraged to call Palliative Care with questions or concerns.                        Glory RosebushVirgie Lucero Auzenne, RN

## 2018-06-12 ENCOUNTER — Other Ambulatory Visit: Payer: Self-pay | Admitting: Family Medicine

## 2018-06-13 ENCOUNTER — Other Ambulatory Visit: Payer: Self-pay | Admitting: Family Medicine

## 2018-06-14 ENCOUNTER — Other Ambulatory Visit: Payer: Self-pay | Admitting: Family Medicine

## 2018-06-18 IMAGING — DX DG CHEST 1V PORT
1 series · 1 of 1 positions shown · non-contrast
Comparison: None.

CLINICAL DATA: Hypotension

EXAM:
PORTABLE CHEST 1 VIEW

[chest ap]
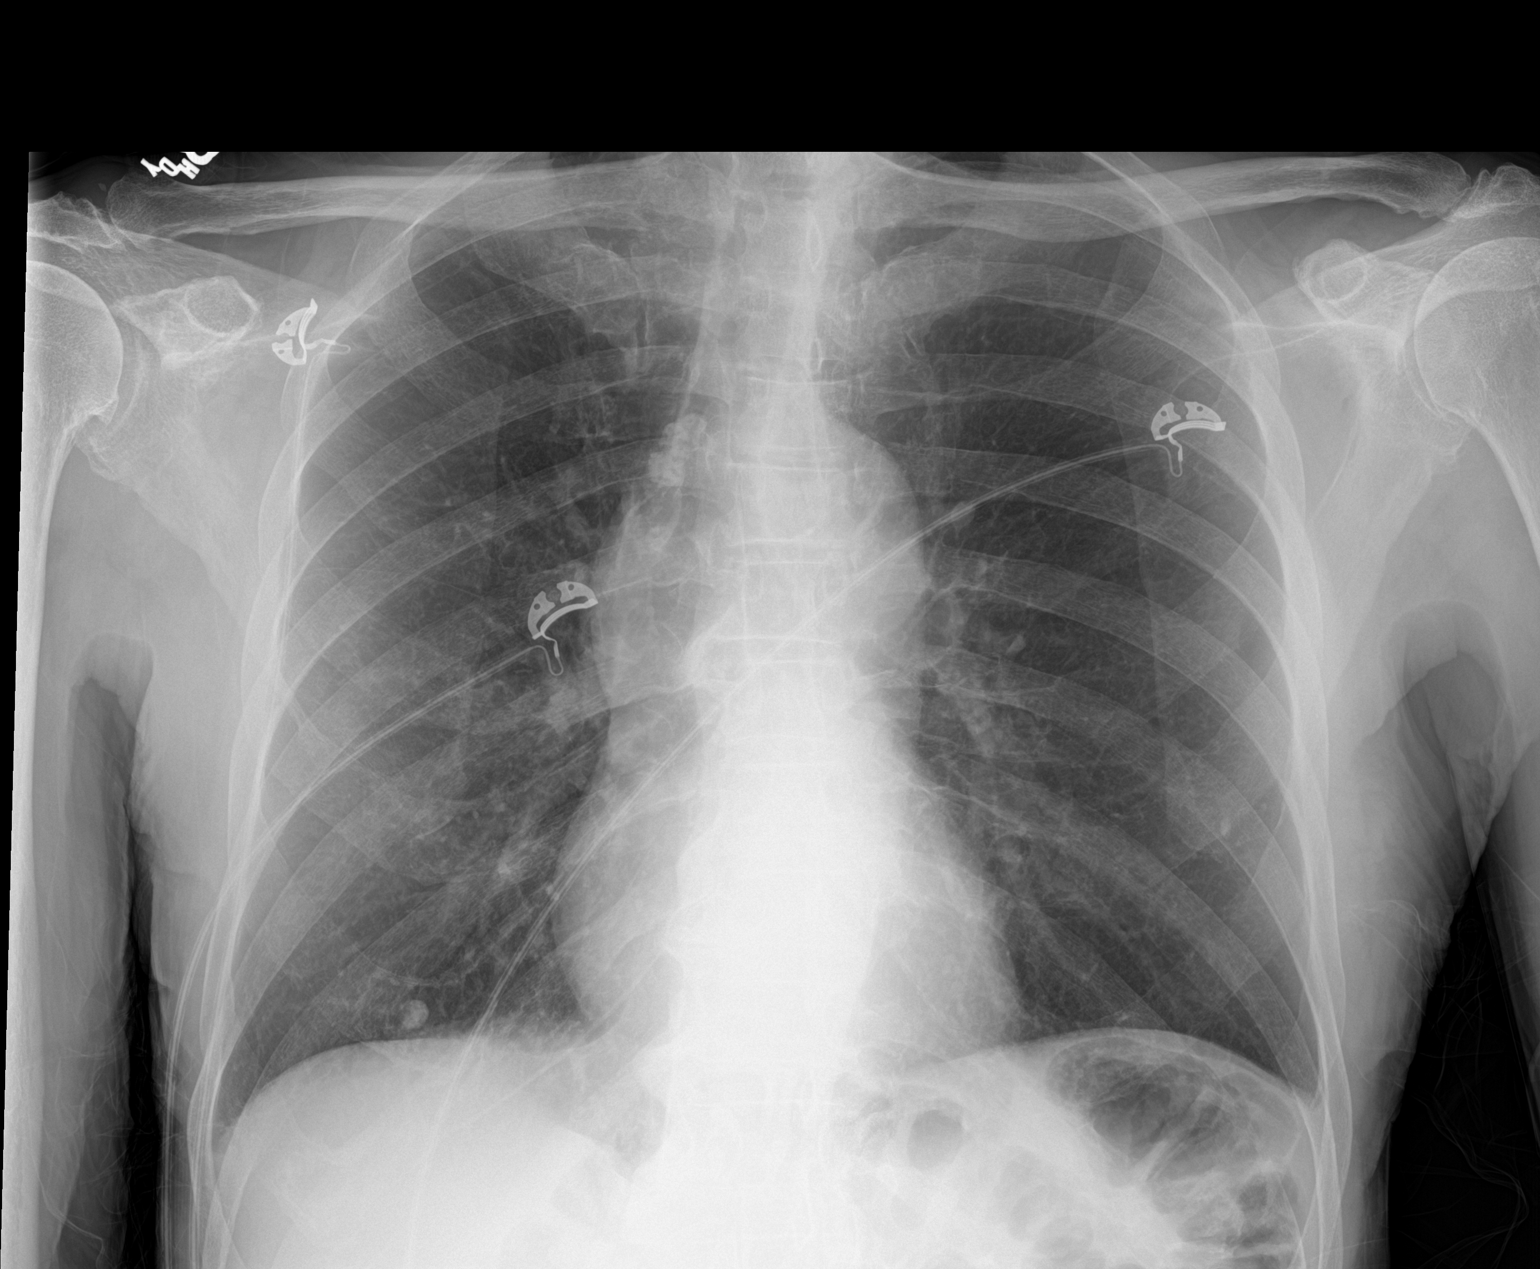

[1 of 1 positions shown; findings below may reference images not displayed]

FINDINGS: There is a calcified granuloma in the right base. There is no edema
or consolidation. Heart size and pulmonary vascular normal. There is
a calcified azygos lymph node. No adenopathy evident. No bone
lesions.
IMPRESSION: Evidence of prior granulomatous disease.  No edema or consolidation.

## 2018-08-07 ENCOUNTER — Other Ambulatory Visit: Payer: Self-pay | Admitting: Family Medicine

## 2018-08-18 ENCOUNTER — Other Ambulatory Visit: Payer: Self-pay | Admitting: Family Medicine

## 2018-08-24 ENCOUNTER — Other Ambulatory Visit: Payer: Medicare Other

## 2018-08-24 ENCOUNTER — Telehealth: Payer: Self-pay | Admitting: Family Medicine

## 2018-08-24 ENCOUNTER — Other Ambulatory Visit: Payer: Self-pay

## 2018-08-24 DIAGNOSIS — Z515 Encounter for palliative care: Secondary | ICD-10-CM

## 2018-08-24 NOTE — Telephone Encounter (Signed)
PT home readings from daughter 220-400 morning and evening Often notations of him eating crackers etc when sugar taken but consistently high Last A1C > 14% Increase Lantus to 15 units, continue glipizide in the morning  Get OV for A1C in 4 weeks

## 2018-08-24 NOTE — Progress Notes (Signed)
PATIENT NAME: Ethan Davis DOB: 08/16/1926 MRN: [id number]  PRIMARY CARE PROVIDER: Salley Scarlet, MD  RESPONSIBLE PARTY:  Acct ID - Guarantor Home Phone Work Phone Relationship Acct Type  [id number] Ethan Davis, WINGERTER* [phone]  Self P/F     [address] (daughter's address), [place], [place] [address]    PLAN OF CARE and INTERVENTIONS:               1.  GOALS OF CARE/ ADVANCE CARE PLANNING: Remain in his wife with family members staying with patient during the day.                2.  PATIENT/CAREGIVER EDUCATION:  Education on precautions to prevent infection. Education on fall precautions.                3.  DISEASE STATUS: Patient is a 85 year old patient who resides in his own home with family members staying with patient during the day.     HISTORY OF PRESENT ILLNESS:    CODE STATUS:   Code Status: Prior  ADVANCED DIRECTIVES: N MOST FORM: No PPS: 50%   PHYSICAL EXAM:   VITALS:There were no vitals filed for this visit./ See vital signs  LUNGS: decreased breath sounds CARDIAC: Cor RRR  EXTREMITIES: none edema SKIN: Skin color, texture, turgor normal. No rashes or lesions  NEURO: positive for memory problems   Palliative Care visit made with SW [name].  [name] in home with patient.  Patient alert and pleasant and denies having any pain.  Patient has not suffered any falls and has had no hospitalizations.  Patient's last MD appointment was 04-2018.  Daughter reports patient stays by himself at night and she arrives at patient's home at 9:30 AM.  Daughter reports someone is in the home with patient umtil 6:30 PM.  Patient is eating well per daughter.  Patient is independent with ambulation and able to do his own ADL's.  Patient's screening for 2019-nCoV was negative.  Patient continues to receive Meals on Wheels.  Patient and daughter deny having any concerns at the present time.  Patient and daughter encourated to call with questions or concerns.                   Ethan Rosebush, RN

## 2018-08-25 MED ORDER — INSULIN GLARGINE 100 UNIT/ML SOLOSTAR PEN: 15.0000 [IU] | PEN_INJECTOR | Freq: Every day | SUBCUTANEOUS | 3 refills | Status: DC

## 2018-08-25 NOTE — Addendum Note (Signed)
Addended by: Legrand Rams B on: 08/25/2018 11:50 AM   Modules accepted: Orders

## 2018-08-25 NOTE — Progress Notes (Signed)
COMMUNITY PALLIATIVE CARE SW NOTE  PATIENT NAME: Ethan Davis DOB: 02/23/1927 MRN: [id number]  PRIMARY CARE PROVIDER: Salley Scarlet, MD  RESPONSIBLE PARTY:  Acct ID - Guarantor Home Phone Work Phone Relationship Acct Type  [id number] Ethan Davis, Ethan Davis* [phone]  Self P/F     [address] (daughter's address), [place], [place] [address]     PLAN OF CARE and INTERVENTIONS:             1. GOALS OF CARE/ ADVANCE CARE PLANNING:  Patient remains a full code.  2. SOCIAL/EMOTIONAL/SPIRITUAL ASSESSMENT/ INTERVENTIONS:  Patient alert and oriented during this visit. Patient's [name] present along with SW and Charity fundraiser. Patient continues to be [name], remains pleasant and able to answer questions correctly. Patient likes to get outside when the weather is nice. Continues to be supported by his family and friends in the community. No concerns noted this visit.  3. PATIENT/CAREGIVER EDUCATION/ COPING:  Family continues to rotate providing care for patient. He is able to be home alone during the night time hours and has family present during the daytime.  4. PERSONAL EMERGENCY PLAN:  Patient or family will call 911 for emergencies.  5. COMMUNITY RESOURCES COORDINATION/ HEALTH CARE NAVIGATION:  Patient continues with keeping regularly scheduled doctor's appointments. No needs or concerns identified at this time.  6. FINANCIAL/LEGAL CONCERNS/INTERVENTIONS:  None.     SOCIAL HX:  Social History   Tobacco Use  . Smoking status: Former Smoker    Packs/day: 0.25    Years: 20.00    Pack years: 5.00    Types: Cigarettes    Last attempt to quit: 03/04/1964    Years since quitting: 54.5  . Smokeless tobacco: Never Used  Substance Use Topics  . Alcohol use: Yes    Comment: very little    CODE STATUS:   Code Status: Prior  ADVANCED DIRECTIVES: N MOST FORM COMPLETE:  no HOSPICE EDUCATION PROVIDED: None this visit, patient not appropriate this visit.   [id number] independent for all ADLs.  I  spent 45 minutes with patient and family from 10:50a-11:35a providing support and encouragement.       Ethan Davis

## 2018-08-25 NOTE — Telephone Encounter (Signed)
Pt's daughter aware, med sent to pharm and apt made

## 2018-08-25 NOTE — Telephone Encounter (Signed)
forward

## 2018-08-26 ENCOUNTER — Other Ambulatory Visit: Payer: Self-pay

## 2018-09-03 ENCOUNTER — Other Ambulatory Visit: Payer: Self-pay

## 2018-09-03 ENCOUNTER — Ambulatory Visit (INDEPENDENT_AMBULATORY_CARE_PROVIDER_SITE_OTHER): Payer: Medicare Other | Admitting: Family Medicine

## 2018-09-03 DIAGNOSIS — R4189 Other symptoms and signs involving cognitive functions and awareness: Secondary | ICD-10-CM

## 2018-09-03 DIAGNOSIS — I10 Essential (primary) hypertension: Secondary | ICD-10-CM | POA: Diagnosis not present

## 2018-09-03 DIAGNOSIS — E1121 Type 2 diabetes mellitus with diabetic nephropathy: Secondary | ICD-10-CM | POA: Diagnosis not present

## 2018-09-03 MED ORDER — GLIPIZIDE 5 MG PO TABS: ORAL_TABLET | ORAL | 2 refills | Status: DC

## 2018-09-03 NOTE — Progress Notes (Signed)
Virtual Visit via Telephone Note  I connected with Ethan Davis on 09/03/18 at 11:33  by telephone and verified that I am speaking with the correct person using two identifiers.    Pt location: Home  Physician Location: Milinda Antis MD, In office, [facility]   Persons on call: Physician, patient and daughter Rilynn Goodwyn who is POA/Caretaker   I discussed the limitations, risks, security and privacy concerns of performing an evaluation and management service by telephone and the availability of in person appointments. I also discussed with the patient that there may be a patient responsible charge related to this service. The patient expressed understanding and agreed to proceed.   History of Present Illness:  Pt states he is feeling good, has no concerns, has been staying within his home. Children tend to his needs during the day, typically he is settled for the night by 7pm  DM- Lantus is costing about $400 from Mail order, daughter is looking into this, he does have insulin right now ,  Lantus increased to 15 units on 3/17  CBG readings -  3/17 311   3/18  358,  3/19 317am 242pm  3/20  353 (EVENING),        252 (3/22).  3/23  202      3/24 356 / PM  334   3/25 275 308  24 hour recall- Breakfast - coffee / mid day - 4 peanut butter crackers, Chedder soup   Meals on Wheels- meatballs and pasta, squash and zuchinni, lima beans (5:30pm)  Eats cheerios for snacks and milk especially in the morning  or peanut butter crackers , drinks sugar free flavored water   Lantus given at 6pm  Pt wont drink ensure, glucerna, boost, rarely eats yogurt, wants more carb snacks and nabs   HTN- had BP checked by palliative care nurse told it was normal  Weifght 180lbs- 2 weeks ago , still taking remeron, wants to know if they can discontinue it since he is eating so much on his own Weight was  170'bs in November       Observations/Objective:  Telephone visit,   Assessment  and Plan:  DM-  Increase to Lantus 18 units Glipizide change to 5mg  twice a day , see if spliting this will help cover his breakfast and lunch, he declines more than one injection a day Goal is to get A1C 8% or less and keep CBG in 200's  Hold the remeron at family request, his appetite is good, has put on a good amount of weight, sleeps fine, will do trial off until his visit in April , will have labs done at that time as well Try to give more protein snacks and cut back on all the carbs, but he is almost 85 years old, so will give some leniency and try to work with the sugars    HTN- bp controlled no changes   Follow Up Instructions:   F/U in office as scheduled A1c will be done I discussed the assessment and treatment plan with the patient. The patient was provided an opportunity to ask questions and all were answered. The patient agreed with the plan and demonstrated an understanding of the instructions.   The patient was advised to call back or seek an in-person evaluation if the symptoms worsen or if the condition fails to improve as anticipated.  I provided  19 minutes of non-face-to-face time during this encounter. End time  11:54  [facility] [place],  MD   

## 2018-09-04 ENCOUNTER — Encounter: Payer: Self-pay | Admitting: Family Medicine

## 2018-09-07 ENCOUNTER — Other Ambulatory Visit: Payer: Self-pay | Admitting: Family Medicine

## 2018-09-22 ENCOUNTER — Ambulatory Visit (INDEPENDENT_AMBULATORY_CARE_PROVIDER_SITE_OTHER): Payer: Medicare Other | Admitting: Family Medicine

## 2018-09-22 ENCOUNTER — Encounter: Payer: Self-pay | Admitting: Family Medicine

## 2018-09-22 ENCOUNTER — Other Ambulatory Visit: Payer: Self-pay

## 2018-09-22 VITALS — BP 124/62 | HR 64 | Temp 98.1°F | Resp 14 | Ht 71.0 in | Wt 176.0 lb

## 2018-09-22 DIAGNOSIS — R4189 Other symptoms and signs involving cognitive functions and awareness: Secondary | ICD-10-CM | POA: Diagnosis not present

## 2018-09-22 DIAGNOSIS — N182 Chronic kidney disease, stage 2 (mild): Secondary | ICD-10-CM

## 2018-09-22 DIAGNOSIS — E1121 Type 2 diabetes mellitus with diabetic nephropathy: Secondary | ICD-10-CM

## 2018-09-22 DIAGNOSIS — I10 Essential (primary) hypertension: Secondary | ICD-10-CM

## 2018-09-22 DIAGNOSIS — E7800 Pure hypercholesterolemia, unspecified: Secondary | ICD-10-CM

## 2018-09-22 LAB — HEMOGLOBIN A1C, FINGERSTICK: Hgb A1C (fingerstick): >14 % OF TOTAL HGB — ABNORMAL HIGH (ref <6.0)

## 2018-09-22 MED ORDER — INSULIN GLARGINE 100 UNIT/ML SOLOSTAR PEN: 20.0000 [IU] | PEN_INJECTOR | Freq: Every day | SUBCUTANEOUS | 3 refills | Status: DC

## 2018-09-22 NOTE — Assessment & Plan Note (Addendum)
Diabetes is uncontrolled and has been for quite some time.  We will recheck his renal function goal to keep his blood sugars less than 200 would like his A1c to get down to at least 8 to 9%.  In office his fingerstick A1c was greater than 14% so we will send this off for venous confirmation with his other labs.  Increase his Lantus to 20 units.  Continue the glipizide this is the best that they can space it out because of the timing of his family members being at his home.  Bedside is acting as much as he can to help cover his mealtimes which he tends to spike significantly.  Family does not want to add any additional injections as this will cause more stress on him and his quality of life is good at this time.  Note his daughter is worried about him dropping but he is averaging still in the upper 200s so I do not think this is going to cause any hypoglycemia the increase in the Lantus to 20 units.  Unfortunately at his age he eats whenever he wants to which is okay.  Try to reduce the sugary snacks as much as possible.  His appetite actually has improved and he has gained back some of the weight that he lost from last year.  He feels well he has not had any falls seems content.  We are not going for any strict blood sugar control but I deftly do not want him staying in the 300s to 400s.  None of any infection on exam.

## 2018-09-22 NOTE — Progress Notes (Signed)
Subjective:    Patient ID: Ethan Davis, male    DOB: September 19, 1926, 85 y.o.   MRN: [id number]  Patient presents for Follow-up (is not fasting)    DM- last A1C > 14%    Can tell a difference when his sugars are up , when he becomes more quiet not as talkative.  He denies  dizzy spells or falls no fever denies any abdominal pain shortness of breath chest pain    Now on Lantus 18 units, glipizide 5 mg twice a day.  Because of the hours when his family members are available to take care of him Lantus is given typically around 6 or 6:30 PM.  He gets glipizide first thing in the morning around 9 AM and he gets the second glipizide after his lunch typically around 2:58 PM   Since his Lantus was increased to 18 units on March 27 his fasting blood sugars have been all over the place.  They range from 1 32-376.  His fasting blood sugar this morning was 352.  His evening blood sugars also range on average from 200-400 and often when his blood sugar is above 300s he has had oatmeal cookies or milk or naps.  He often eats a lot of snacks now that his appetite has improved.  Even off of the Remeron.  They do leave snacks out for him since there is no one at the house after about 7:00 until they come back in the morning to check on him.  He tends to drink a lot of milk as well but they are trying to get him switched over to flavored water.      Review Of Systems:  GEN- denies fatigue, fever, weight loss,weakness, recent illness HEENT- denies eye drainage, change in vision, nasal discharge, CVS- denies chest pain, palpitations RESP- denies SOB, cough, wheeze ABD- denies N/V, change in stools, abd pain GU- denies dysuria, hematuria, dribbling, incontinence MSK- denies joint pain, muscle aches, injury Neuro- denies headache, dizziness, syncope, seizure activity       Objective:    BP 124/62   Pulse 64   Temp 98.1 F (36.7 C) (Oral)   Resp 14   Ht 5\' 11"  (1.803 m)   Wt 176 lb (79.8 kg)   SpO2 98%    BMI 24.55 kg/m  GEN- NAD, alert and oriented x3 HEENT- PERRL, EOMI, non injected sclera, pink conjunctiva, MMM, oropharynx clear,arcus senilus Neck- Supple,  CVS- RRR, no murmur RESP-CTAB ABD-NABS,soft,NT,ND EXT- No edema Pulses- Radial 2+, DP- palpated        Assessment & Plan:      Problem List Items Addressed This Visit      Unprioritized   CKD (chronic kidney disease), stage II   Cognitive impairment    Still followed by palliative care but he has been quite stable since his hospitalization so they are not actively doing any management at this time.      Essential hypertension, benign - Primary    Blood pressure is controlled no changes to medication.      Relevant Orders   CBC with Differential/Platelet   Comprehensive metabolic panel   Hyperlipidemia   Type 2 diabetes with nephropathy (HCC)    Diabetes is uncontrolled and has been for quite some time.  We will recheck his renal function goal to keep his blood sugars less than 200 would like his A1c to get down to at least 8 to 9%.  In office his fingerstick A1c was greater  than 14% so we will send this off for venous confirmation with his other labs.  Increase his Lantus to 20 units.  Continue the glipizide this is the best that they can space it out because of the timing of his family members being at his home.  Bedside is acting as much as he can to help cover his mealtimes which he tends to spike significantly.  Family does not want to add any additional injections as this will cause more stress on him and his quality of life is good at this time.  Note his daughter is worried about him dropping but he is averaging still in the upper 200s so I do not think this is going to cause any hypoglycemia the increase in the Lantus to 20 units.  Unfortunately at his age he eats whenever he wants to which is okay.  Try to reduce the sugary snacks as much as possible.  His appetite actually has improved and he has gained back some of  the weight that he lost from last year.  He feels well he has not had any falls seems content.  We are not going for any strict blood sugar control but I deftly do not want him staying in the 300s to 400s.  None of any infection on exam.      Relevant Medications   Insulin Glargine (LANTUS SOLOSTAR) 100 UNIT/ML Solostar Pen   Other Relevant Orders   Hemoglobin A1C, fingerstick (Completed)   Hemoglobin A1c   HM DIABETES FOOT EXAM (Completed)      Note: This dictation was prepared with Dragon dictation along with smaller phrase technology. Any transcriptional errors that result from this process are unintentional.

## 2018-09-22 NOTE — Assessment & Plan Note (Signed)
Still followed by palliative care but he has been quite stable since his hospitalization so they are not actively doing any management at this time.

## 2018-09-22 NOTE — Patient Instructions (Signed)
Lantus 20 units at bedtime Continue glipzide twice a day  Telephone visit- 3 weeks  F/U 4 months

## 2018-09-22 NOTE — Assessment & Plan Note (Signed)
Blood pressure is controlled no changes to medication. °

## 2018-09-23 ENCOUNTER — Telehealth: Payer: Self-pay

## 2018-09-23 LAB — CBC WITH DIFFERENTIAL/PLATELET
Absolute Monocytes: 462 cells/uL (ref 200–950)
Basophils Absolute: 33 cells/uL (ref 0–200)
Basophils Relative: 0.5 %
Eosinophils Absolute: 198 cells/uL (ref 15–500)
Eosinophils Relative: 3 %
HCT: 41.3 % (ref 38.5–50.0)
Hemoglobin: 13.9 g/dL (ref 13.2–17.1)
Lymphs Abs: 2739 cells/uL (ref 850–3900)
MCH: 30.4 pg (ref 27.0–33.0)
MCHC: 33.7 g/dL (ref 32.0–36.0)
MCV: 90.4 fL (ref 80.0–100.0)
MPV: 12.3 fL (ref 7.5–12.5)
Monocytes Relative: 7 %
Neutro Abs: 3168 cells/uL (ref 1500–7800)
Neutrophils Relative %: 48 %
Platelets: 135 10*3/uL — ABNORMAL LOW (ref 140–400)
RBC: 4.57 10*6/uL (ref 4.20–5.80)
RDW: 12.3 % (ref 11.0–15.0)
Total Lymphocyte: 41.5 %
WBC: 6.6 10*3/uL (ref 3.8–10.8)

## 2018-09-23 LAB — COMPREHENSIVE METABOLIC PANEL
AG Ratio: 1.5 (calc) (ref 1.0–2.5)
ALT: 9 U/L (ref 9–46)
AST: 18 U/L (ref 10–35)
Albumin: 4.2 g/dL (ref 3.6–5.1)
Alkaline phosphatase (APISO): 110 U/L (ref 35–144)
BUN/Creatinine Ratio: 10 (calc) (ref 6–22)
BUN: 12 mg/dL (ref 7–25)
CO2: 26 mmol/L (ref 20–32)
Calcium: 9.4 mg/dL (ref 8.6–10.3)
Chloride: 102 mmol/L (ref 98–110)
Creat: 1.22 mg/dL — ABNORMAL HIGH (ref 0.70–1.11)
Globulin: 2.8 g/dL (calc) (ref 1.9–3.7)
Glucose, Bld: 324 mg/dL — ABNORMAL HIGH (ref 65–99)
Potassium: 4.8 mmol/L (ref 3.5–5.3)
Sodium: 137 mmol/L (ref 135–146)
Total Bilirubin: 0.6 mg/dL (ref 0.2–1.2)
Total Protein: 7 g/dL (ref 6.1–8.1)

## 2018-09-23 LAB — HEMOGLOBIN A1C
Hgb A1c MFr Bld: 13.6 % of total Hgb — ABNORMAL HIGH (ref <5.7)
Mean Plasma Glucose: 344 (calc)
eAG (mmol/L): 19 (calc)

## 2018-09-23 NOTE — Telephone Encounter (Signed)
Telephone call to patient's daughter [name] to schedule TELEHEALTH visit for patient.  Daughter in agreement with TELEHEALTH visit on 09-28-18 at 11:00.  Invite will be email to daughter [name].

## 2018-09-28 ENCOUNTER — Other Ambulatory Visit: Payer: Medicare Other

## 2018-09-28 ENCOUNTER — Other Ambulatory Visit: Payer: Self-pay

## 2018-09-28 DIAGNOSIS — Z515 Encounter for palliative care: Secondary | ICD-10-CM

## 2018-09-28 NOTE — Progress Notes (Signed)
COMMUNITY PALLIATIVE CARE SW NOTE  PATIENT NAME: Ethan Davis DOB: 10/04/1926 MRN: [id number]  PRIMARY CARE PROVIDER: Alycia Rossetti, MD  RESPONSIBLE PARTY:  Acct ID - Guarantor Home Phone Work Phone Relationship Acct Type  [id number] JAMEZ, [phone]  Self P/F     [address] (daughter's address), [place], [place] [address]     PLAN OF CARE and INTERVENTIONS:             1. GOALS OF CARE/ ADVANCE CARE PLANNING:  Patient remains a full code. 2. SOCIAL/EMOTIONAL/SPIRITUAL ASSESSMENT/ INTERVENTIONS:  SW and RN met with patient and daughter, [name] via TELEHEALTH. Patient was sitting up in chair, smiling. Denies pain. [name] states that patient has memory loss, but seems to remember what happens day to day. Patient is sleeping and eating well. Patient enjoys to listening to the radio and music.  3. PATIENT/CAREGIVER EDUCATION/ COPING:  Family continues to rotate care in the home. Patient is home alone at night, and seems to be doing fine. Patient and daughter indicate positive coping skills, enjoy company of one another.  4. PERSONAL EMERGENCY PLAN:  Patient/family will call 9-1-1 for emergencies. Due to COVID-19 crisis, family verbalized that he is remaining at home or in the car, wearing masks and washing hands frequently.  5. COMMUNITY RESOURCES COORDINATION/ HEALTH CARE NAVIGATION:  SW to follow-up with [facility] regarding increase cost of insulin pens. SW will follow-up with [name]. [name] also asked about a camera/monitoring system to help keep an eye on patient at night. Team to follow-up.   6. FINANCIAL/LEGAL CONCERNS/INTERVENTIONS:  No concerns other than cost of insulin pen.     SOCIAL HX:  Social History   Tobacco Use  . Smoking status: Former Smoker    Packs/day: 0.25    Years: 20.00    Pack years: 5.00    Types: Cigarettes    Last attempt to quit: 03/04/1964    Years since quitting: 54.6  . Smokeless tobacco: Never Used  Substance Use Topics  . Alcohol use:  Yes    Comment: very little    CODE STATUS:   Code Status: Prior  ADVANCED DIRECTIVES: N MOST FORM COMPLETE:  No HOSPICE EDUCATION PROVIDED: None.  PPS: Patient is independent of ADLs.  Due to the COVID-19 crisis, this visit was done via Zoom from my office and it was initiated and consent by this patient and/or family. This was a scheduled visit.   I spent 30 minutes with patient/family, from 11:00-a providing education, support and consultation.     Margaretmary Lombard, LCSW

## 2018-09-28 NOTE — Progress Notes (Signed)
PATIENT NAME: Ethan Davis DOB: May 02, 1927 MRN: [id number]  PRIMARY CARE PROVIDER: Salley Scarlet, MD  RESPONSIBLE PARTY:  Acct ID - Guarantor Home Phone Work Phone Relationship Acct Type  [id number] Ethan Davis, MCQUOWN* [phone]  Self P/F     [address] (daughter's address), [place], [place] [address]    PLAN OF CARE and INTERVENTIONS:               1.  GOALS OF CARE/ ADVANCE CARE PLANNING: Remain in his home with children staying with patient during the day.                2.  PATIENT/CAREGIVER EDUCATION:  Education on s/s of infection, fever, chills, cough, shortness of breath, weakness.                3.  DISEASE STATUS: Patient is a 85 year old patient with memory loss and diabetes.  Patient's [name] in home with patient ad able to connect for TELEHEALTH visit. Due to the COVID-19 crisis, this visit was done via telemedicine from my office and it was initiated and consent by this patient and or family. [name] reports patient saw Dr Jeanice Lim 09/22/18 and vital signs recorded were from that visit.  Daughter reports MD remains concerned about patient's Insulin.  Patient's Hemoglobin A1C was 13 down from 14 at last visit.  Daughter reports MD increased patient's Lantus from 18 units to 20 units.  Family wants to increase Insulin slowly as they are concerned patient's blood sugar may "bottom out."  Daughter also reports patient's Insulin cost increased from 100.00 to 480.00.  SW to [employer] and follow up.  Daughter also requesting information on devices that could be purchased so family could better monitor patient when family is not in the home. Daughter reports patient has been doing well.  Patient denies having pain.  Patient has not suffered any falls.  Patient's appetite is good and patient continues to enjoy snacking.  Patient's current weight is 176 pounds.  Patient denies having any cough or shortness of breath.  Patient has no edema in his lower  extremities.  Paient sleeps well at night.  Patient has meds in home and family makes sure patient has what he needs.  Patient and daughter encouraged to call with questions or concerns.    HISTORY OF PRESENT ILLNESS:    CODE STATUS: Full Code  ADVANCED DIRECTIVES:N MOST FORM: No PPS: 50%   PHYSICAL EXAM:   VITALS:Vital signs per [name] from MD appointmet 09/22/18  LUNGS: No cough or shortness of breath CARDIAC: Regular EXTREMITIES: none edema SKIN: Skin color, texture, turgor normal. No rashes or lesions  NEURO: positive for memory problems       Glory Rosebush, RN

## 2018-10-13 ENCOUNTER — Other Ambulatory Visit: Payer: Self-pay

## 2018-10-13 ENCOUNTER — Encounter: Payer: Self-pay | Admitting: Family Medicine

## 2018-10-13 ENCOUNTER — Ambulatory Visit (INDEPENDENT_AMBULATORY_CARE_PROVIDER_SITE_OTHER): Payer: Medicare Other | Admitting: Family Medicine

## 2018-10-13 VITALS — BP 122/62 | HR 78 | Temp 98.1°F | Resp 16 | Ht 71.0 in | Wt 175.0 lb

## 2018-10-13 DIAGNOSIS — E1121 Type 2 diabetes mellitus with diabetic nephropathy: Secondary | ICD-10-CM | POA: Diagnosis not present

## 2018-10-13 NOTE — Patient Instructions (Signed)
Ask to see if it is the deductible? Or ask if there is a cheaper one on the formulary  2 weeks via phone

## 2018-10-13 NOTE — Progress Notes (Signed)
Subjective:    Patient ID: Ethan Davis, male    DOB: Jan 11, 1927, 85 y.o.   MRN: [id number]  Patient presents for Follow-up (DM- is not fasting) Continue to follow diabetes mellitus.  This is actually supposed to be a telephone follow-up as he was in the office on 4/15 there was some confusion.  His A1c was elevated at 13.6% with minimal decrease from November when it was greater than 14%.  I discussed increasing his Lantus to 20 units the family has been reluctant.  Unable to really control his diet any further at his age he does have a lot of peanut butter nabs drinks milk and snacks from time to time.  He feels well has not had any recent illness. His kidney function has been fairly stable last creatinine 1.22 with a BUN of 12 His random glucose in the office for the last visit was 324  Blood sugars over the past 2 weeks fasting have been 186 up to 420 on April 30 he had a 420.  Most of his blood sugars are still between 250 and 300.  In the afternoons his blood sugars are still in the 300s.  He is still on the glipizide which they give at breakfast and between 2 and 4 PM because that is when his children are available.  He then gets his insulin shot between 6 and 6:30 PM Daughter states she did not change the insulin to 20 units, despite discussing with her and nurse/palliative care reminding her to change it   Review Of Systems:  GEN- denies fatigue, fever, weight loss,weakness, recent illness HEENT- denies eye drainage, change in vision, nasal discharge, CVS- denies chest pain, palpitations RESP- denies SOB, cough, wheeze ABD- denies N/V, change in stools, abd pain GU- denies dysuria, hematuria, dribbling, incontinence MSK- denies joint pain, muscle aches, injury Neuro- denies headache, dizziness, syncope, seizure activity       Objective:    BP 122/62   Pulse 78   Temp 98.1 F (36.7 C) (Oral)   Resp 16   Ht 5\' 11"  (1.803 m)   Wt 175 lb (79.4 kg)   SpO2 96%   BMI 24.41  kg/m  GEN- NAD, alert and oriented x3 HEENT- PERRL, EOMI, non injected sclera, pink conjunctiva, MMM, oropharynx clear CVS- RRR, no murmur RESP-CTAB Ext- no edema         Assessment & Plan:      Problem List Items Addressed This Visit      Unprioritized   Type 2 diabetes with nephropathy (HCC) - Primary    Discussed the importance of trying to get his blood sugar down.  Increase the Lantus to 20 units advised that we will need to slowly titrate up until we can get his fasting to be consistently 200 or less.  His A1c is significantly elevated.  Even though he feels well he is still eating I am concerned with his blood sugars being so high.  She does have insulin at home right now.  However she is given contact insurance company about the deductible and the cost.  If need be we can change him to basaglar which may be cheaper out-of-pocket or change him to 70/30 They want to limit how many injections he gets a day but also right that he will bottom out which I have discussed with her with his blood sugars being so high the dose of insulin he is on he would not bottom out at this level.  To  his renal disease and his agent he is not on a SGLT he did not have any significant improvement with GLP-1 therapy he is off metformin due to his renal disease and age. He will continue glipizide 5 mg which we are using in the place of the short acting insulin         Note: This dictation was prepared with Dragon dictation along with smaller phrase technology. Any transcriptional errors that result from this process are unintentional.

## 2018-10-13 NOTE — Assessment & Plan Note (Addendum)
Discussed the importance of trying to get his blood sugar down.  Increase the Lantus to 20 units advised that we will need to slowly titrate up until we can get his fasting to be consistently 200 or less.  His A1c is significantly elevated.  Even though he feels well he is still eating I am concerned with his blood sugars being so high.  She does have insulin at home right now.  However she is given contact insurance company about the deductible and the cost.  If need be we can change him to basaglar which may be cheaper out-of-pocket or change him to 70/30 They want to limit how many injections he gets a day but also right that he will bottom out which I have discussed with her with his blood sugars being so high the dose of insulin he is on he would not bottom out at this level.  To his renal disease and his agent he is not on a SGLT he did not have any significant improvement with GLP-1 therapy he is off metformin due to his renal disease and age. He will continue glipizide 5 mg which we are using in the place of the short acting insulin

## 2018-10-27 ENCOUNTER — Ambulatory Visit (INDEPENDENT_AMBULATORY_CARE_PROVIDER_SITE_OTHER): Payer: Medicare Other | Admitting: Family Medicine

## 2018-10-27 ENCOUNTER — Other Ambulatory Visit: Payer: Self-pay

## 2018-10-27 ENCOUNTER — Other Ambulatory Visit: Payer: Self-pay | Admitting: Family Medicine

## 2018-10-27 ENCOUNTER — Telehealth: Payer: Self-pay

## 2018-10-27 ENCOUNTER — Encounter: Payer: Self-pay | Admitting: Family Medicine

## 2018-10-27 DIAGNOSIS — E1121 Type 2 diabetes mellitus with diabetic nephropathy: Secondary | ICD-10-CM | POA: Diagnosis not present

## 2018-10-27 NOTE — Progress Notes (Signed)
Virtual Visit via Telephone Note  I connected with Ethan Davis on 10/27/18 at 12:00pm   by telephone and verified that I am speaking with the correct person using two identifiers.    Pt location: at home   Physician location:  In office, [facility], Ethan Antis MD     On call: patient and physician , [name]    I discussed the limitations, risks, security and privacy concerns of performing an evaluation and management service by telephone and the availability of in person appointments. I also discussed with the patient that there may be a patient responsible charge related to this service. The patient expressed understanding and agreed to proceed.   History of Present Illness: Has tele visit tomorrow with pallatiave  DM- LAST a1c 13.6%, Taking Lantus 20 units and glipizide twice a day most days    Starting with 5/16- fasting blood sugars 116,165, 118, 201 (5/19), 249     Evening start 5/16-  209, 400 (5/17), 320 , 113 ( at 4:30pm- HOLD OFF on evening glipizide because CBG was already)  He is doing okay at home, not sluggish, no falls, no severe fatigue, eating well.     Observations/Objective: NAD   Assessment and Plan: Diabetes- diabetes much improved, continue lantus 20 units, glipizide, okay to hold evening glipizide if CBG are 120 or less.  nO CURRENT hypoglycemia symptoms Repeat A1C in a few months Continue to monitor CBG, call if they start going back > 200 Goal keep his CBG less than 200  Follow Up Instructions: F/U August 11th at 11am, repeat A1c    I discussed the assessment and treatment plan with the patient. The patient was provided an opportunity to ask questions and all were answered. The patient agreed with the plan and demonstrated an understanding of the instructions.   The patient was advised to call back or seek an in-person evaluation if the symptoms worsen or if the condition fails to improve as anticipated.  I provided 10  minutes of non-face-to-face time during this encounter. End time 12:10pm   Ethan Antis, MD

## 2018-10-27 NOTE — Telephone Encounter (Signed)
Telephone call from patient's daughter [name].  RN had left message requesting call back to schedule Telehealth visit.  Daughter in agreement with Telehealth visit 10/28/18 at 9:30 AM.

## 2018-10-28 ENCOUNTER — Other Ambulatory Visit: Payer: Self-pay

## 2018-10-28 ENCOUNTER — Other Ambulatory Visit: Payer: Medicare Other

## 2018-10-28 DIAGNOSIS — Z515 Encounter for palliative care: Secondary | ICD-10-CM

## 2018-10-28 NOTE — Progress Notes (Signed)
COMMUNITY PALLIATIVE CARE SW NOTE  PATIENT NAME: Ethan Davis DOB: 1926/08/28 MRN: [id number]  PRIMARY CARE PROVIDER: Alycia Rossetti, MD  RESPONSIBLE PARTY:  Acct ID - Guarantor Home Phone Work Phone Relationship Acct Type  [id number] Ethan, [name]  Self P/F     [address] (daughter's address), [place], [place] [address]     PLAN OF CARE and INTERVENTIONS:             1. GOALS OF CARE/ ADVANCE CARE PLANNING:  Patient remains a FULL CODE. 2. SOCIAL/EMOTIONAL/SPIRITUAL ASSESSMENT/ INTERVENTIONS:  SW and RN met with patient and daughter, Ethan Davis via TELEHEALTH. Ethan Davis said patient is doing "fine". Patient had telehealth visit with PCP yesterday, went well and Ethan Davis said PCP was pleased with patient's status. Denies any pain. Patient is eating well, and sleeping well. Patient naps on and off during the day. Ethan Davis said she switched to mail order for patient's medicines to make it easier. Ethan Davis reports that patient is enjoying going out for car rides and going to visit his children at their homes.  3. PATIENT/CAREGIVER EDUCATION/ COPING:  Patient and family doing well coping, and support one another. Denies any concerns. 4. PERSONAL EMERGENCY PLAN:  Patient/family will call 9-1-1 for emergencies. Due to COVID-19 crisis, family verbalized that he is remaining at home/in the car, wearing masks and washing hands frequently. 5. COMMUNITY RESOURCES COORDINATION/ HEALTH CARE NAVIGATION:  PCP follow-up scheduled for August. Ethan Davis verbalized that she would contact palliative team if any needs arise. 6. FINANCIAL/LEGAL CONCERNS/INTERVENTIONS:  None.     SOCIAL HX:  Social History   Tobacco Use  . Smoking status: Former Smoker    Packs/day: 0.25    Years: 20.00    Pack years: 5.00    Types: Cigarettes    Last attempt to quit: 03/04/1964    Years since quitting: 54.6  . Smokeless tobacco: Never Used  Substance Use Topics  . Alcohol use: Yes    Comment: very little     CODE STATUS:   Code Status: Prior  ADVANCED DIRECTIVES: N MOST FORM COMPLETE:  No. HOSPICE EDUCATION PROVIDED: None.  PPS: Patient is independent of ADLs.  Due to the COVID-19 crisis, this visit was done viaZoomfrom my office and it was initiated and consent by this patient and/or family.This was a scheduled visit.  I spent30 minutes with patient/family, from 9:30-10:00aproviding education, support and consultation.    Ethan Lombard, LCSW

## 2018-10-28 NOTE — Progress Notes (Signed)
PATIENT NAME: Ethan Davis DOB: 03-21-1927 MRN: [id number]  PRIMARY CARE PROVIDER: Salley Scarlet, MD  RESPONSIBLE PARTY:  Acct ID - Guarantor Home Phone Work Phone Relationship Acct Type  [id number] JAVONNI, FRISBIE* [phone]  Self P/F     [address] (daughter's address), [place], [place] [address]    PLAN OF CARE and INTERVENTIONS:               1.  GOALS OF CARE/ ADVANCE CARE PLANNING:  Remain  in his home with children alternating providing care.                2.  PATIENT/CAREGIVER EDUCATION:  Education on s/s of infection, fever, chills, cough, shortness of breath, fatigue, education on fall precautions.               3.  DISEASE STATUS: Patient is a 85 year old patient who continues to remain in his home with his children alternating taking turns staying with him during th day.  Patient stays alone at night. Due to the COVID-19 crisis, this visit was done via telemedicine from my office and it was initiated and consent by this patient and or family. Patient's daughter [name] in home with patient today.  Patient denies having any pain.  [name] provides patient's vital sings form last MD appointment. Daughter reports she feels patient is doing well.  Daughter has taken patient out to other daughters homes but patient stays at home most of the time.  Patient's blood sugars have been better since increase in insulin.  Patient has not suffered any falls.  Dughter denies patient having any fever, cough or shortness of breath.  Patient's appetite is good per daughter.  Patient has no edema in lower extremities.  Patient has been sleeping well and "cat naps" during the day per [name].  Daughter and patient deny having any needs or concerns at the present time.  Daughter encouraged to call with concerns.        HISTORY OF PRESENT ILLNESS:    CODE STATUS: Full Code  ADVANCED DIRECTIVES: N MOST FORM: No PPS: 50%   PHYSICAL EXAM:   VITALS:Vital signs per daughter listed in vital sigs   LUNGS: denies shortness of breath/cough CARDIAC:  EXTREMITIES: no edema SKIN: Skin color, texture, turgor normal. No rashes or lesions  NEURO: positive for memory problems       Glory Rosebush, RN

## 2018-11-18 ENCOUNTER — Telehealth: Payer: Self-pay

## 2018-11-18 NOTE — Telephone Encounter (Signed)
Telephone call to patient's daughter [name] to schedule Telehealth visit.  Daughter in agreement with telehealth visit 11/23/18 at 11:00 AM.

## 2018-11-23 ENCOUNTER — Other Ambulatory Visit: Payer: Medicare Other

## 2018-11-23 ENCOUNTER — Other Ambulatory Visit: Payer: Self-pay

## 2018-11-23 DIAGNOSIS — Z515 Encounter for palliative care: Secondary | ICD-10-CM

## 2018-11-23 NOTE — Progress Notes (Signed)
PATIENT NAME: Ethan Davis DOB: [birth year] MRN: [id number]  PRIMARY CARE PROVIDER: Alycia Rossetti, MD  RESPONSIBLE PARTY:  Acct ID - Guarantor Home Phone Work Phone Relationship Acct Type  [id number] Ethan, Davis  Self P/F     [address] (daughter's address), [place], [place] [address]    PLAN OF CARE and INTERVENTIONS:               1.  GOALS OF CARE/ ADVANCE CARE PLANNING:  Remain in his home with children taking turns staying with patient.                2.  PATIENT/CAREGIVER EDUCATION:  Education on fall precautions, education on s/s of infection.               3.  DISEASE STATUS: Patient is a 85 year old patient who resides in his home.  Patient's children take turns staying with patient during the day and patient stays by himself at night. Patient sitting in recliner chair, patient's [name] in home with patient today.  Patient denies having any pain.  Patient is doing "good" per daughter.  Daughter reports patient's blood sugars have been doing better.  Patient's blood sugar this AM was 240.  Daughter reports MD stopped 1 of patient 's meds.  Patient  Receives 20 units of Insulin at bedtime.  Patient has not suffered any falls.  Patient's daughter states patient has been going to the grocery store with her and enjoys getting out in the yard to look at garden.  Patient gets short of breath with exertion, patient has not had any cough.  Patient has no edema in his lower extremities.  Patient has been sleeping well.  Daughter informed since patient is doing well, daughter informed palliatitve care NP would be following up with patient.  Daughter denies having any  Concerns regarding patient.  Daughter informed to call with questions or concerns.       HISTORY OF PRESENT ILLNESS:    CODE STATUS: Full Code  ADVANCED DIRECTIVES: N MOST FORM: No PPS: 50%   PHYSICAL EXAM:   VITALS: Today's Vitals   11/23/18 1110  PainSc: 0-No pain    LUNGS: Shortness of  breath on exertion/ denies cough CARDIAC:  EXTREMITIES: none edema SKIN: Skin color, texture, turgor normal. No rashes or lesions  NEURO: positive for memory problems       Nilda Simmer, RN

## 2018-11-23 NOTE — Progress Notes (Signed)
COMMUNITY PALLIATIVE CARE SW NOTE  PATIENT NAME: Ethan Davis DOB: 1927-03-23 MRN: [id number]  PRIMARY CARE PROVIDER: Alycia Rossetti, MD  RESPONSIBLE PARTY:  Acct ID - Guarantor Home Phone Work Phone Relationship Acct Type  [id number] MOHD, [phone]  Self P/F     [address] (daughter's address), [place], [place] [address]     PLAN OF CARE and INTERVENTIONS:             1. GOALS OF CARE/ ADVANCE CARE PLANNING:  Patient is a FULL CODE. 2. SOCIAL/EMOTIONAL/SPIRITUAL ASSESSMENT/ INTERVENTIONS:  SW and RN met with patient and daughter, [name] via TELEHEALTH. [name] said that patient is doing "good". Patient reports no pain. Patient is eating and sleeping well. Family continues to monitor blood glucose and administer insulin. Patient has enjoyed the warmer weather and getting out to go to the store.  3. PATIENT/CAREGIVER EDUCATION/ COPING:  Patient and family indicate positive coping skills. No concerns at this time.  4. PERSONAL EMERGENCY PLAN:  Patient/family will call 9-1-1 for emergencies.Due to COVID-19 crisis,familyverbalized that patient is being cautious when in public. 5. COMMUNITY RESOURCES COORDINATION/ HEALTH CARE NAVIGATION:  Family continues to rotate and coordinate care. Patient does receive Meals on Wheels. [name] is also planning to follow-up with local pantry for food and knows how to access this resource. Patient's next PCP visit is scheduled for August. 6. FINANCIAL/LEGAL CONCERNS/INTERVENTIONS:  Insulin cost is expensive per [name] but they are looking into cost reduction programs with PCP and pharmacy. No other concerns.     SOCIAL HX:  Social History   Tobacco Use  . Smoking status: Former Smoker    Packs/day: 0.25    Years: 20.00    Pack years: 5.00    Types: Cigarettes    Quit date: 03/04/1964    Years since quitting: 54.7  . Smokeless tobacco: Never Used  Substance Use Topics  . Alcohol use: Yes    Comment: very little    CODE STATUS:    Code Status: Prior  ADVANCED DIRECTIVES: N MOST FORM COMPLETE:  No. HOSPICE EDUCATION PROVIDED: No.  [id number] is independent of ADLs.  Due to the COVID-19 crisis, this visit was done viaZoomfrom my office and it was initiated and consent by this patient and/or family.This was a scheduled visit.  I spent30 minutes with patient/family, [id number] education, support and consultation.Due to patient's stability, patient will no longer be followed by [facility] team at this time. Patient will remain with [facility] team and be followed by NP. Patient/family agreeable to plan, no questions/concerns. Daughter confirmed that she will contact [facility] team if anything changes.    Margaretmary Lombard, LCSW

## 2018-12-02 ENCOUNTER — Telehealth: Payer: Self-pay | Admitting: Nurse Practitioner

## 2018-12-03 ENCOUNTER — Other Ambulatory Visit: Payer: Self-pay | Admitting: Family Medicine

## 2018-12-03 NOTE — Telephone Encounter (Signed)
Called patient's [name] to schedule Palliative f/u visit with NP, no answer.  Left message with reason for call along with my name and contact number.

## 2018-12-07 ENCOUNTER — Other Ambulatory Visit: Payer: Self-pay | Admitting: Family Medicine

## 2019-01-11 ENCOUNTER — Other Ambulatory Visit: Payer: Self-pay | Admitting: Family Medicine

## 2019-01-12 ENCOUNTER — Telehealth: Payer: Self-pay | Admitting: Nurse Practitioner

## 2019-01-12 NOTE — Telephone Encounter (Signed)
Spoke with patient's daughter Jack Quarto and have scheduled a Palliatave f/u visit for 02/08/19 @ 11 AM.

## 2019-01-18 ENCOUNTER — Ambulatory Visit (INDEPENDENT_AMBULATORY_CARE_PROVIDER_SITE_OTHER): Payer: Medicare Other | Admitting: Family Medicine

## 2019-01-18 ENCOUNTER — Other Ambulatory Visit: Payer: Self-pay

## 2019-01-18 ENCOUNTER — Encounter: Payer: Self-pay | Admitting: Family Medicine

## 2019-01-18 VITALS — BP 128/68 | HR 72 | Temp 97.9°F | Resp 14 | Ht 71.0 in | Wt 177.0 lb

## 2019-01-18 DIAGNOSIS — I10 Essential (primary) hypertension: Secondary | ICD-10-CM | POA: Diagnosis not present

## 2019-01-18 DIAGNOSIS — E7800 Pure hypercholesterolemia, unspecified: Secondary | ICD-10-CM | POA: Diagnosis not present

## 2019-01-18 DIAGNOSIS — E1121 Type 2 diabetes mellitus with diabetic nephropathy: Secondary | ICD-10-CM

## 2019-01-18 DIAGNOSIS — N182 Chronic kidney disease, stage 2 (mild): Secondary | ICD-10-CM

## 2019-01-18 MED ORDER — GLIPIZIDE 5 MG PO TABS: ORAL_TABLET | ORAL | 2 refills | Status: DC

## 2019-01-18 MED ORDER — DOXAZOSIN MESYLATE 4 MG PO TABS: ORAL_TABLET | ORAL | 3 refills | Status: AC

## 2019-01-18 MED ORDER — FINASTERIDE 5 MG PO TABS: ORAL_TABLET | ORAL | 3 refills | Status: AC

## 2019-01-18 MED ORDER — DONEPEZIL HCL 10 MG PO TABS: ORAL_TABLET | ORAL | 2 refills | Status: AC

## 2019-01-18 NOTE — Assessment & Plan Note (Signed)
Blood sugars have improved.  If we can get his A1c around 8 to 9% I think that this will be good for his age.  We are very wary of hypoglycemia in him.  He feels well right now.  No change to the Lantus 20 units or oral meds.

## 2019-01-18 NOTE — Assessment & Plan Note (Signed)
Blood pressure is controlled no change in medication. 

## 2019-01-18 NOTE — Progress Notes (Signed)
   Subjective:    Patient ID: Ethan Davis, male    DOB: Oct 10, 1926, 85 y.o.   MRN: [id number]  Patient presents for Follow-up (is fasting)  Pt here for intermin F/U on diabetes  DM-  Doesn't always give the evening dose of glipizide he is on Lantus 20 units in the evening this is usually given about 6:00.  He has not had any significant hypoglycemia episodes but they will give him juice or crackers if his blood sugar gets to be about 120 or 130.  They will often hold his glipizide at this time as well.  His blood sugars are still up and down but in general both fasting and evening range from 1 60-2 50s he does have a few 300s the highest 386 one evening.  His appetite is good bowels are moving urinating without any difficulty.  He has not had any recent falls.  His children take care of him they have no specific concerns today.    Due for repeat labs   Review Of Systems:  GEN- denies fatigue, fever, weight loss,weakness, recent illness HEENT- denies eye drainage, change in vision, nasal discharge, CVS- denies chest pain, palpitations RESP- denies SOB, cough, wheeze ABD- denies N/V, change in stools, abd pain GU- denies dysuria, hematuria, dribbling, incontinence MSK- denies joint pain, muscle aches, injury Neuro- denies headache, dizziness, syncope, seizure activity       Objective:    BP 128/68   Pulse 72   Temp 97.9 F (36.6 C) (Oral)   Resp 14   Ht 5\' 11"  (1.803 m)   Wt 177 lb (80.3 kg)   SpO2 95%   BMI 24.69 kg/m  GEN- NAD, alert and oriented x3 HEENT- PERRL, EOMI, non injected sclera, pink conjunctiva, MMM, oropharynx clear CVS- RRR, no murmur RESP-CTAB ABD-NABS,soft,NT,ND EXT- No edema Pulses- Radial, DP- 2+        Assessment & Plan:      Problem List Items Addressed This Visit      Unprioritized   CKD (chronic kidney disease), stage II   Relevant Orders   Comprehensive metabolic panel   Essential hypertension, benign - Primary    Blood pressure is  controlled no change in medication.      Relevant Medications   doxazosin (CARDURA) 4 MG tablet   Hyperlipidemia   Relevant Medications   doxazosin (CARDURA) 4 MG tablet   Other Relevant Orders   Lipid panel   Type 2 diabetes with nephropathy ([name])    Blood sugars have improved.  If we can get his A1c around 8 to 9% I think that this will be good for his age.  We are very wary of hypoglycemia in him.  He feels well right now.  No change to the Lantus 20 units or oral meds.      Relevant Medications   glipiZIDE (GLUCOTROL) 5 MG tablet   Other Relevant Orders   Comprehensive metabolic panel   Hemoglobin A1c   Lipid panel      Note: This dictation was prepared with Dragon dictation along with smaller phrase technology. Any transcriptional errors that result from this process are unintentional.

## 2019-01-18 NOTE — Patient Instructions (Signed)
F/U 4 months for Physical  

## 2019-01-19 LAB — COMPREHENSIVE METABOLIC PANEL
AG Ratio: 1.3 (calc) (ref 1.0–2.5)
ALT: 10 U/L (ref 9–46)
AST: 14 U/L (ref 10–35)
Albumin: 4.1 g/dL (ref 3.6–5.1)
Alkaline phosphatase (APISO): 111 U/L (ref 35–144)
BUN/Creatinine Ratio: 11 (calc) (ref 6–22)
BUN: 13 mg/dL (ref 7–25)
CO2: 26 mmol/L (ref 20–32)
Calcium: 9.5 mg/dL (ref 8.6–10.3)
Chloride: 102 mmol/L (ref 98–110)
Creat: 1.21 mg/dL — ABNORMAL HIGH (ref 0.70–1.11)
Globulin: 3.1 g/dL (calc) (ref 1.9–3.7)
Glucose, Bld: 211 mg/dL — ABNORMAL HIGH (ref 65–99)
Potassium: 4.5 mmol/L (ref 3.5–5.3)
Sodium: 137 mmol/L (ref 135–146)
Total Bilirubin: 0.8 mg/dL (ref 0.2–1.2)
Total Protein: 7.2 g/dL (ref 6.1–8.1)

## 2019-01-19 LAB — LIPID PANEL
Cholesterol: 220 mg/dL — ABNORMAL HIGH (ref <200)
HDL: 70 mg/dL (ref >=40)
LDL Cholesterol (Calc): 131 mg/dL (calc) — ABNORMAL HIGH
Non-HDL Cholesterol (Calc): 150 mg/dL (calc) — ABNORMAL HIGH (ref <130)
Total CHOL/HDL Ratio: 3.1 (calc) (ref <5.0)
Triglycerides: 87 mg/dL (ref <150)

## 2019-01-19 LAB — HEMOGLOBIN A1C
Hgb A1c MFr Bld: 11.6 % of total Hgb — ABNORMAL HIGH (ref <5.7)
Mean Plasma Glucose: 286 (calc)
eAG (mmol/L): 15.9 (calc)

## 2019-02-03 ENCOUNTER — Other Ambulatory Visit: Payer: Self-pay | Admitting: *Deleted

## 2019-02-03 MED ORDER — LANTUS SOLOSTAR 100 UNIT/ML ~~LOC~~ SOPN: 20.0000 [IU] | PEN_INJECTOR | Freq: Every day | SUBCUTANEOUS | 2 refills | Status: AC

## 2019-02-08 ENCOUNTER — Other Ambulatory Visit: Payer: Self-pay

## 2019-02-08 ENCOUNTER — Encounter: Payer: Self-pay | Admitting: Nurse Practitioner

## 2019-02-08 ENCOUNTER — Other Ambulatory Visit: Payer: Medicare Other | Admitting: Nurse Practitioner

## 2019-02-08 DIAGNOSIS — Z515 Encounter for palliative care: Secondary | ICD-10-CM | POA: Diagnosis not present

## 2019-02-08 NOTE — Progress Notes (Signed)
[facility] Consult Note Telephone: [phone]  Fax: [phone]  PATIENT NAME: Ethan Davis DOB: 10-25-1926 MRN: [id number]  PRIMARY CARE PROVIDER:   Alycia Rossetti, MD  REFERRING PROVIDER:  Alycia Rossetti, MD [address] [facility] [place],  [place] [address]  RESPONSIBLE PARTY:  [name] [id number]  Due to the COVID-19 crisis, this visit was done via telemedicine from my office and it was initiated and consent by this patient and or family.   RECOMMENDATIONS and PLAN:  1. ACP: Full code, will re-address at next [facility] visit. Will mail hard choice book and blank MOST form for review and re-visit at next [facility] visit, sooner if family ready to complete  2. Memory loss secondary to dementia. Continue with supportive measures as chronic disease remains Progressive.  3. Palliative care encounter Palliative medicine team will continue to support patient, patient's family, and medical team. Visit consisted of counseling and education dealing with the complex and emotionally intense issues of symptom management and palliative care in the setting of serious and potentially life-threatening illness  I spent 40 minutes providing this consultation,  from 11:00am to 11:40am. More than 50% of the time in this consultation was spent coordinating communication.   HISTORY OF PRESENT ILLNESS:  Ethan Davis is a 85 y.o. year old male with multiple medical problems including cognitive impairment, DM, HTN, CKD. I called this phrase daughter for schedule palliative care follow-up visit. We talked about purpose of palliative care follow-up visit and daughter in agreement. We talked about how [name] is doing. [name] endorses that he's been doing much better. He lives alone although family does stay with him throughout the day everyday. They do go home in the evening time after they get him settled for bed. He is able to ambulate with a walker. He is  able to perform his adl's. He is able to make himself a sandwich and appetite has been very good for [name]. We talked about overall nutrition and he has gained a fair amount of weight. We talked about symptoms of shortness of breath and pain which she denies. We talked about Life review. He worked as a Brewing technologist tobacco. We talked about his wife and being widowed about 15 years.  We talked about  family dynamics. We talked about progression of natural Aging in the setting of chronic disease. We talked about his cognitive impairment which is mild. We talked about medical goals of care. We talked about role of palliative care and plan of care. Discuss follow-up palliative care visit in two months if needed or sooner should he declined. [name] in agreement an appointment scheduled. Therapeutic listening and emotional support provided. Questions answered to satisfaction. Contact information provided. Palliative Care was asked to help to continue to address goals of care.   CODE STATUS: Full code  PPS: 50% HOSPICE ELIGIBILITY/DIAGNOSIS: TBD  PAST MEDICAL HISTORY:  Past Medical History:  Diagnosis Date  . BPH (benign prostatic hyperplasia)   . Cataract   . Diabetes mellitus   . Hypercholesteremia   . Hypertension     SOCIAL HX:  Social History   Tobacco Use  . Smoking status: Former Smoker    Packs/day: 0.25    Years: 20.00    Pack years: 5.00    Types: Cigarettes    Quit date: 03/04/1964    Years since quitting: 54.9  . Smokeless tobacco: Never Used  Substance Use Topics  . Alcohol  use: Yes    Comment: very little    ALLERGIES: No Known Allergies   PERTINENT MEDICATIONS:  Outpatient Encounter Medications as of 02/08/2019  Medication Sig  . ACCU-CHEK GUIDE test strip USE AS DIRECTED TO MONITOR BLOOD SUGAR 3X DAILY. DX: E11.9.  . Accu-Chek Softclix Lancets lancets TEST BLOOD SUGAR 3 TIMES DAILY AS DIRECTED E11.9  . Acetaminophen 650 MG TABS Take 1 tablet (650 mg total) by mouth 3  (three) times daily as needed.  . donepezil (ARICEPT) 10 MG tablet TAKE 1 TABLET AT BEDTIME  . doxazosin (CARDURA) 4 MG tablet TAKE 1 TABLET (4 MG TOTAL) BY MOUTH AT BEDTIME.  . finasteride (PROSCAR) 5 MG tablet TAKE 1 TABLET EVERY DAY  . glipiZIDE (GLUCOTROL) 5 MG tablet Take 5mg  BID for diabetes  . Insulin Glargine (LANTUS SOLOSTAR) 100 UNIT/ML Solostar Pen Inject 20 Units into the skin at bedtime.  . Insulin Pen Needle (BD PEN NEEDLE NANO U/F) 32G X 4 MM MISC USE AS DIRECTED TO INJECT LANTUS DAILY  . simvastatin (ZOCOR) 20 MG tablet TAKE 1 TABLET AT BEDTIME   No facility-administered encounter medications on file as of 02/08/2019.     PHYSICAL EXAM:   Deferred   Z , NP

## 2019-02-15 DIAGNOSIS — Z23 Encounter for immunization: Secondary | ICD-10-CM | POA: Diagnosis not present

## 2019-03-15 ENCOUNTER — Other Ambulatory Visit: Payer: Medicare Other | Admitting: Nurse Practitioner

## 2019-03-15 ENCOUNTER — Telehealth: Payer: Self-pay | Admitting: Nurse Practitioner

## 2019-03-15 ENCOUNTER — Other Ambulatory Visit: Payer: Self-pay

## 2019-03-15 NOTE — Telephone Encounter (Signed)
I called [name] for scheduled telemedicine PC visit, no answer, message left with contact information

## 2019-03-16 ENCOUNTER — Encounter: Payer: Self-pay | Admitting: Nurse Practitioner

## 2019-03-16 ENCOUNTER — Other Ambulatory Visit: Payer: Self-pay

## 2019-03-16 ENCOUNTER — Other Ambulatory Visit: Payer: Medicare Other | Admitting: Nurse Practitioner

## 2019-03-16 DIAGNOSIS — Z515 Encounter for palliative care: Secondary | ICD-10-CM

## 2019-03-16 NOTE — Progress Notes (Signed)
Therapist, nutritional Palliative Care Consult Note Telephone: [phone]  Fax: [phone]  PATIENT NAME: Ethan Davis DOB: 30-Oct-1926 MRN: [id number]  PRIMARY CARE PROVIDER:   Salley Scarlet, MD  REFERRING PROVIDER:  Salley Scarlet, MD [address] [facility] [address] [place],  [place] [address]  RESPONSIBLE PARTY:   [name] [phone]  Due to the COVID-19 crisis, this visit was done via telemedicine from my office and it was initiated and consent by this patient and or family.   RECOMMENDATIONS and PLAN:  1. ACP: Full code discussed, medical goals of care, Ethan Davis endorses she will continue to talk with her siblings about code status. Ethan Davis endorses for now she wants to keep full code with aggressive interventions  2. Memory loss secondary to dementia. Continue with supportive measures as chronic disease remains Progressive.  3. Palliative care encounter Palliative medicine team will continue to support patient, patient's family, and medical team. Visit consisted of counseling and education dealing with the complex and emotionally intense issues of symptom management and palliative care in the setting of serious and potentially life-threatening illness  I spent 45 minutes providing this consultation,  from 10:30am to 11:45am. More than 50% of the time in this consultation was spent coordinating communication.   HISTORY OF PRESENT ILLNESS:  Ethan Davis is a 85 y.o. year old male with multiple medical problems including cognitive impairment, DM, HTN, CKD. I called Ethan Davis, Ethan Davis daughter health care power-of-attorney for telemedicine, telephonic schedule palliative care follow-up visit. Smelling in agreement. We talked about how Mr Valentino Davis has been feeling. Ethan Davis endorses that he has been doing great. He continues to be able to be [place], adl's and feeds himself. Appetite continues to be good. No recent Falls, wounds, infections,  hospitalizations. Medical goals we discussed at length including aggressive vs. Comfort Care. Maveline talked at length about her experience with hospice. At present time Ethan Davis does not eligible for hospice as he does not meet criteria. We talked about code status at length is he currently is a full code with aggressive interventions. Maveline endorses that she will further discuss with her siblings and Ethan Davis. She feels like at this time Ethan Davis still request to be resuscitated although she is not sure that he fully understands what that means and if he would want that. But they did meet with the attorneys and a advance directive was completed saying that he wants aggressive interventions to keep him alive. Ethan Davis is open to continuing discussions about goals of care. We talked about role of palliative care and plan of care. Discuss that at present time he remains stable. Will follow up in six weeks if needed or sooner should he declined. Ethan Davis in agreement. Therapeutic listening and emotional support provided. Contact information provided. Questions answered to satisfaction.  Palliative Care was asked to help to continue to address goals of care.   CODE STATUS: full code  PPS: 50% HOSPICE ELIGIBILITY/DIAGNOSIS: TBD  PAST MEDICAL HISTORY:  Past Medical History:  Diagnosis Date  . BPH (benign prostatic hyperplasia)   . Cataract   . Diabetes mellitus   . Hypercholesteremia   . Hypertension     SOCIAL HX:  Social History   Tobacco Use  . Smoking status: Former Smoker    Packs/day: 0.25    Years: 20.00    Pack years: 5.00    Types: Cigarettes    Quit date: 03/04/1964    Years since quitting: 55.0  .  Smokeless tobacco: Never Used  Substance Use Topics  . Alcohol use: Yes    Comment: very little    ALLERGIES: No Known Allergies   PERTINENT MEDICATIONS:  Outpatient Encounter Medications as of 03/16/2019  Medication Sig  . ACCU-CHEK GUIDE test strip USE AS DIRECTED TO  MONITOR BLOOD SUGAR 3X DAILY. DX: E11.9.  . Accu-Chek Softclix Lancets lancets TEST BLOOD SUGAR 3 TIMES DAILY AS DIRECTED E11.9  . Acetaminophen 650 MG TABS Take 1 tablet (650 mg total) by mouth 3 (three) times daily as needed.  . donepezil (ARICEPT) 10 MG tablet TAKE 1 TABLET AT BEDTIME  . doxazosin (CARDURA) 4 MG tablet TAKE 1 TABLET (4 MG TOTAL) BY MOUTH AT BEDTIME.  . finasteride (PROSCAR) 5 MG tablet TAKE 1 TABLET EVERY DAY  . glipiZIDE (GLUCOTROL) 5 MG tablet Take 5mg  BID for diabetes  . Insulin Glargine (LANTUS SOLOSTAR) 100 UNIT/ML Solostar Pen Inject 20 Units into the skin at bedtime.  . Insulin Pen Needle (BD PEN NEEDLE NANO U/F) 32G X 4 MM MISC USE AS DIRECTED TO INJECT LANTUS DAILY  . simvastatin (ZOCOR) 20 MG tablet TAKE 1 TABLET AT BEDTIME   No facility-administered encounter medications on file as of 03/16/2019.     PHYSICAL EXAM:  deferred Ethan Ihor Gully, NP

## 2019-03-17 NOTE — Telephone Encounter (Signed)
error 

## 2019-04-28 ENCOUNTER — Other Ambulatory Visit: Payer: Medicare Other | Admitting: Nurse Practitioner

## 2019-04-28 ENCOUNTER — Encounter: Payer: Self-pay | Admitting: Nurse Practitioner

## 2019-04-28 ENCOUNTER — Other Ambulatory Visit: Payer: Self-pay

## 2019-04-28 DIAGNOSIS — R4189 Other symptoms and signs involving cognitive functions and awareness: Secondary | ICD-10-CM | POA: Diagnosis not present

## 2019-04-28 DIAGNOSIS — Z515 Encounter for palliative care: Secondary | ICD-10-CM | POA: Diagnosis not present

## 2019-04-28 NOTE — Progress Notes (Signed)
[facility] Consult Note Telephone: [phone]  Fax: [phone]  PATIENT NAME: Ethan Davis DOB: Sep 26, 1926 MRN: [id number]  PRIMARY CARE PROVIDER:   Alycia Rossetti, MD  REFERRING PROVIDER:  Alycia Rossetti, MD [address] [facility] [place],  [place] [address]  RESPONSIBLE PARTY:  [name] [phone]  Due to the COVID-19 crisis, this visit was done via telemedicine from my office and it was initiated and consent by this patient and or family.   RECOMMENDATIONS and PLAN:  1. ACP: Full code, will re-address at next [facility] visit. Wishes are to continue to remain full code with aggressive interventions  2. Memory loss secondary to dementia. Continue with supportive measures as chronic disease remains Progressive.  3. Palliative care encounter Palliative medicine team will continue to support patient, patient's family, and medical team. Visit consisted of counseling and education dealing with the complex and emotionally intense issues of symptom management and palliative care in the setting of serious and potentially life-threatening illness  I spent 45 minutes providing this consultation,  from 12:00-pm to 12:45pm. More than 50% of the time in this consultation was spent coordinating communication.   HISTORY OF PRESENT ILLNESS:  Ethan Davis is a 85 y.o. year old male with multiple medical problems including cognitive impairment, DM, HTN, CKD. I called Ethan Davis, Ethan Davis daughter for scheduled palliative care follow-up visit telemedicine / telephonic. We talked about how Ethan Davis is doing at the age of 85. He continues to have family members with him during the day. He does stay by himself at night as he has his own routine. Ethan Davis endorses memories better some days than others. He continues to be able to function independently what help with meals. Appetite has improved. No symptoms of pain. No functional changes. No recent  falls, wounds, infections, hospitalizations. We talked about medical goals of care to include aggressive versus comfort care. We talked about code status as he currently is a full code with aggressive interventions. Ethan Davis endorses that that is what his wishes are and they will continue to honor them. We talked about role of palliative care and plan of care. We talked about follow-up appointment as currently he is stable in 2 months if needed or sooner should he declined. Ethan Davis in agreement. Therapeutic listening and emotional support provided. Contact information. Questions answered to satisfaction  Palliative Care was asked to help to continue to address goals of care.   CODE STATUS: Full code  PPS: 50% HOSPICE ELIGIBILITY/DIAGNOSIS: TBD  PAST MEDICAL HISTORY:  Past Medical History:  Diagnosis Date  . BPH (benign prostatic hyperplasia)   . Cataract   . Diabetes mellitus   . Hypercholesteremia   . Hypertension     SOCIAL HX:  Social History   Tobacco Use  . Smoking status: Former Smoker    Packs/day: 0.25    Years: 20.00    Pack years: 5.00    Types: Cigarettes    Quit date: 03/04/1964    Years since quitting: 55.1  . Smokeless tobacco: Never Used  Substance Use Topics  . Alcohol use: Yes    Comment: very little    ALLERGIES: No Known Allergies   PERTINENT MEDICATIONS:  Outpatient Encounter Medications as of 04/28/2019  Medication Sig  . ACCU-CHEK GUIDE test strip USE AS DIRECTED TO MONITOR BLOOD SUGAR 3X DAILY. DX: E11.9.  . Accu-Chek Softclix Lancets lancets TEST BLOOD SUGAR 3 TIMES DAILY AS DIRECTED E11.9  .  Acetaminophen 650 MG TABS Take 1 tablet (650 mg total) by mouth 3 (three) times daily as needed.  . donepezil (ARICEPT) 10 MG tablet TAKE 1 TABLET AT BEDTIME  . doxazosin (CARDURA) 4 MG tablet TAKE 1 TABLET (4 MG TOTAL) BY MOUTH AT BEDTIME.  . finasteride (PROSCAR) 5 MG tablet TAKE 1 TABLET EVERY DAY  . glipiZIDE (GLUCOTROL) 5 MG tablet Take 5mg  BID for diabetes   . Insulin Glargine (LANTUS SOLOSTAR) 100 UNIT/ML Solostar Pen Inject 20 Units into the skin at bedtime.  . Insulin Pen Needle (BD PEN NEEDLE NANO U/F) 32G X 4 MM MISC USE AS DIRECTED TO INJECT LANTUS DAILY  . simvastatin (ZOCOR) 20 MG tablet TAKE 1 TABLET AT BEDTIME   No facility-administered encounter medications on file as of 04/28/2019.     PHYSICAL EXAM:   Deferred  Isaia Hassell Z Jaislyn Blinn, NP

## 2019-05-24 ENCOUNTER — Encounter: Payer: Medicare Other | Admitting: Family Medicine

## 2019-05-25 ENCOUNTER — Encounter: Payer: Self-pay | Admitting: Family Medicine

## 2019-05-25 ENCOUNTER — Other Ambulatory Visit: Payer: Self-pay

## 2019-05-25 ENCOUNTER — Ambulatory Visit (INDEPENDENT_AMBULATORY_CARE_PROVIDER_SITE_OTHER): Payer: Medicare Other | Admitting: Family Medicine

## 2019-05-25 VITALS — BP 124/68 | HR 76 | Temp 98.7°F | Resp 16 | Ht 71.0 in | Wt 178.0 lb

## 2019-05-25 DIAGNOSIS — E7800 Pure hypercholesterolemia, unspecified: Secondary | ICD-10-CM

## 2019-05-25 DIAGNOSIS — N400 Enlarged prostate without lower urinary tract symptoms: Secondary | ICD-10-CM

## 2019-05-25 DIAGNOSIS — I10 Essential (primary) hypertension: Secondary | ICD-10-CM | POA: Diagnosis not present

## 2019-05-25 DIAGNOSIS — E1121 Type 2 diabetes mellitus with diabetic nephropathy: Secondary | ICD-10-CM | POA: Diagnosis not present

## 2019-05-25 DIAGNOSIS — Z0001 Encounter for general adult medical examination with abnormal findings: Secondary | ICD-10-CM

## 2019-05-25 DIAGNOSIS — R4189 Other symptoms and signs involving cognitive functions and awareness: Secondary | ICD-10-CM

## 2019-05-25 DIAGNOSIS — Z0000 Encounter for general adult medical examination without abnormal findings: Secondary | ICD-10-CM

## 2019-05-25 DIAGNOSIS — N182 Chronic kidney disease, stage 2 (mild): Secondary | ICD-10-CM | POA: Diagnosis not present

## 2019-05-25 DIAGNOSIS — E441 Mild protein-calorie malnutrition: Secondary | ICD-10-CM

## 2019-05-25 MED ORDER — ACCU-CHEK GUIDE VI STRP: ORAL_STRIP | 6 refills | Status: AC

## 2019-05-25 MED ORDER — BD PEN NEEDLE NANO U/F 32G X 4 MM MISC: 3 refills | Status: AC

## 2019-05-25 MED ORDER — ACCU-CHEK SOFTCLIX LANCETS MISC: 6 refills | Status: AC

## 2019-05-25 NOTE — Progress Notes (Signed)
Subjective:   Patient presents for Medicare Annual/Subsequent preventive examination.  Patient here with his daughter who is his main caregiver.  He is here to follow-up chronic medical problems and for medical wellness visit.  They have no particular concerns today.  He has been doing well at home.  He has not had any falls.  His appetite is good.  Has not had any recent illness.  He is still followed by the palliative care team but there is been no decline.  He is still full code  Diabetes mellitus his last A1c had improved to 11% goal is to get around 9%.  They are consistently giving him Lantus 20 units and glipizide 5 mg in the morning but will only give the evening dose of glipizide if the sugars are very high such as above 300s.  Over the past week or 2 his blood sugars have ranged from 150- 300 both fasting and evening I have copies of all of his blood sugar readings for the past couple of months.  Yesterday his fasting blood sugar was 119 yet this morning fasting blood sugar was 225 They do try to control the snacks that he is eating Review Past Medical/Family/Social: Per EMR   Risk Factors  Current exercise habits: none  Dietary issues discussed: Yes per above   Cardiac risk factors: DM, HTN, Hyperlipidemia   Depression Screen  (Note: if answer to either of the following is "Yes", a more complete depression screening is indicated)  Over the past two weeks, have you felt down, depressed or hopeless? No Over the past two weeks, have you felt little interest or pleasure in doing things? No Have you lost interest or pleasure in daily life? No Do you often feel hopeless? No Do you cry easily over simple problems? No   Activities of Daily Living  In your present state of health, do you have any difficulty performing the following activities?:  Driving? Yes  Managing money? Yes Feeding yourself? No  Getting from bed to chair? No  Climbing a flight of stairs? No  Preparing food and  eating?: Preparing  Bathing or showering? No  Getting dressed: No  Getting to the toilet? No  Using the toilet:No  Moving around from place to place: No  In the past year have you fallen or had a near fall?:No  Are you sexually active? No    Hearing Difficulties: Yes  Do you often ask people to speak up or repeat themselves? yes  Do you experience ringing or noises in your ears? No Do you have difficulty understanding soft or whispered voices? No  Do you feel that you have a problem with memory? Yes Do you often misplace items? Yes Do you feel safe at home? Yes  Cognitive Testing - Cognitive decline on Aricept Alert? Yes Normal Appearance?Yes  Oriented to person? Yes Place? Yes  Time? Yes  Recall of three objects? Yes  Can perform simple calculations? Yes  Displays appropriate judgment?Yes    List the Names of Other Physician/Practitioners you currently use:  Palliative care   Screening Tests / Date Maxed age, Immunizations UTD, declines shingles vaccine   ROS: GEN- denies fatigue, fever, weight loss,weakness, recent illness HEENT- denies eye drainage, change in vision, nasal discharge, CVS- denies chest pain, palpitations RESP- denies SOB, cough, wheeze ABD- denies N/V, change in stools, abd pain GU- denies dysuria, hematuria, dribbling, incontinence MSK- denies joint pain, muscle aches, injury Neuro- denies headache, dizziness, syncope, seizure activity   Physical:  GEN- NAD, alert and oriented x3 ,walks unassisted, but you do have to direct him HEENT- PERRL, EOMI, non injected sclera, pink conjunctiva, arcus senilius Neck- Supple, no thryomegaly, no bruit  CVS- RRR, no murmur RESP-CTAB ABD-NABS,soft,NT,ND EXT- No edema Pulses- Radial, DP-palpated    Assessment:    Annual wellness medicare exam   Plan:    During the course of the visit the patient was educated and counseled about appropriate screening and preventive services including:   Fall/audit  C/depression screen negative Immunizations up-to-date  Diabetes mellitus he has uncontrolled diabetes mellitus which is chronic and long-term at his age we are not expecting tight control.  He overall feels well he has been maintaining his weight.  I would like to get him to an A1c about 9% but they want to reduce the number of injections he does have to have.  I think be more consistent with the evening dose of glipizide would be helpful even if he only does 2.5 mg.  Check his A1c and renal function today Continue checking blood sugar twice a day  Hypertension blood pressure is controlled no changes  Hyperlipidemia continue simvastatin 20 mg goal is LDL 120 or less for his age  Cognitive decline continue Aricept he is in palliative care there have been no significant changes.  Full code family does want aggressive measures as needed  BPH continue Cardura  Follow-up 4 months        Diet review for nutrition referral? Yes ____ Not Indicated __x__  Patient Instructions (the written plan) was given to the patient.  Medicare Attestation  I have personally reviewed:  The patient's medical and social history  Their use of alcohol, tobacco or illicit drugs  Their current medications and supplements  The patient's functional ability including ADLs,fall risks, home safety risks, cognitive, and hearing and visual impairment  Diet and physical activities  Evidence for depression or mood disorders  The patient's weight, height, BMI, and visual acuity have been recorded in the chart. I have made referrals, counseling, and provided education to the patient based on review of the above and I have provided the patient with a written personalized care plan for preventive services.

## 2019-05-25 NOTE — Patient Instructions (Signed)
F/u 4 MONTHS  

## 2019-05-26 LAB — COMPREHENSIVE METABOLIC PANEL
AG Ratio: 1.5 (calc) (ref 1.0–2.5)
ALT: 11 U/L (ref 9–46)
AST: 17 U/L (ref 10–35)
Albumin: 4.1 g/dL (ref 3.6–5.1)
Alkaline phosphatase (APISO): 107 U/L (ref 35–144)
BUN/Creatinine Ratio: 10 (calc) (ref 6–22)
BUN: 12 mg/dL (ref 7–25)
CO2: 28 mmol/L (ref 20–32)
Calcium: 9.5 mg/dL (ref 8.6–10.3)
Chloride: 101 mmol/L (ref 98–110)
Creat: 1.22 mg/dL — ABNORMAL HIGH (ref 0.70–1.11)
Globulin: 2.8 g/dL (calc) (ref 1.9–3.7)
Glucose, Bld: 241 mg/dL — ABNORMAL HIGH (ref 65–99)
Potassium: 4.3 mmol/L (ref 3.5–5.3)
Sodium: 135 mmol/L (ref 135–146)
Total Bilirubin: 0.7 mg/dL (ref 0.2–1.2)
Total Protein: 6.9 g/dL (ref 6.1–8.1)

## 2019-05-26 LAB — CBC WITH DIFFERENTIAL/PLATELET
Absolute Monocytes: 648 cells/uL (ref 200–950)
Basophils Absolute: 32 cells/uL (ref 0–200)
Basophils Relative: 0.4 %
Eosinophils Absolute: 182 cells/uL (ref 15–500)
Eosinophils Relative: 2.3 %
HCT: 41.9 % (ref 38.5–50.0)
Hemoglobin: 14.1 g/dL (ref 13.2–17.1)
Lymphs Abs: 3334 cells/uL (ref 850–3900)
MCH: 30.6 pg (ref 27.0–33.0)
MCHC: 33.7 g/dL (ref 32.0–36.0)
MCV: 90.9 fL (ref 80.0–100.0)
MPV: 12.3 fL (ref 7.5–12.5)
Monocytes Relative: 8.2 %
Neutro Abs: 3705 cells/uL (ref 1500–7800)
Neutrophils Relative %: 46.9 %
Platelets: 137 10*3/uL — ABNORMAL LOW (ref 140–400)
RBC: 4.61 10*6/uL (ref 4.20–5.80)
RDW: 12.6 % (ref 11.0–15.0)
Total Lymphocyte: 42.2 %
WBC: 7.9 10*3/uL (ref 3.8–10.8)

## 2019-05-26 LAB — LIPID PANEL
Cholesterol: 161 mg/dL (ref <200)
HDL: 65 mg/dL (ref >=40)
LDL Cholesterol (Calc): 79 mg/dL (calc)
Non-HDL Cholesterol (Calc): 96 mg/dL (calc) (ref <130)
Total CHOL/HDL Ratio: 2.5 (calc) (ref <5.0)
Triglycerides: 90 mg/dL (ref <150)

## 2019-05-26 LAB — HEMOGLOBIN A1C
Hgb A1c MFr Bld: 12.5 % of total Hgb — ABNORMAL HIGH (ref <5.7)
Mean Plasma Glucose: 312 (calc)
eAG (mmol/L): 17.3 (calc)

## 2019-05-27 ENCOUNTER — Other Ambulatory Visit: Payer: Self-pay | Admitting: *Deleted

## 2019-05-27 MED ORDER — GLIPIZIDE 5 MG PO TABS: ORAL_TABLET | ORAL | 2 refills | Status: AC

## 2019-05-31 ENCOUNTER — Encounter: Payer: Self-pay | Admitting: Family Medicine

## 2019-05-31 ENCOUNTER — Ambulatory Visit (INDEPENDENT_AMBULATORY_CARE_PROVIDER_SITE_OTHER): Payer: Medicare Other | Admitting: Family Medicine

## 2019-05-31 ENCOUNTER — Other Ambulatory Visit: Payer: Self-pay

## 2019-05-31 VITALS — BP 90/56 | HR 94 | Temp 97.7°F | Resp 12 | Ht 71.0 in | Wt 178.0 lb

## 2019-05-31 DIAGNOSIS — Z20828 Contact with and (suspected) exposure to other viral communicable diseases: Secondary | ICD-10-CM | POA: Diagnosis not present

## 2019-05-31 DIAGNOSIS — R509 Fever, unspecified: Secondary | ICD-10-CM | POA: Diagnosis not present

## 2019-05-31 DIAGNOSIS — Z20822 Contact with and (suspected) exposure to covid-19: Secondary | ICD-10-CM

## 2019-05-31 MED ORDER — LEVOFLOXACIN 500 MG PO TABS: 500.0000 mg | ORAL_TABLET | Freq: Every day | ORAL | 0 refills | Status: DC

## 2019-05-31 MED ORDER — CIPROFLOXACIN HCL 250 MG PO TABS: 250.0000 mg | ORAL_TABLET | Freq: Two times a day (BID) | ORAL | 0 refills | Status: DC

## 2019-05-31 NOTE — Progress Notes (Signed)
Subjective:    Patient ID: Ethan Davis, male    DOB: Jan 28, 1927, 85 y.o.   MRN: [id number]  HPI  Appointment was originally made for dizziness.  Patient had low blood pressures at home.  However upon arrival, the patient is accompanied by his daughter who provides additional history.  Daughter states that he had a fever to 101.0 this morning.  Apparently he has had a fever off and on for the last 3 days.  They deny any cough.  They deny any runny nose.  However the patient is unable to provide any history due to his dementia.  However the daughter who helps care for him states that she has had a "sinus infection" for the last 3 days as well.  She believes she got what ever she has from him.  She reports head congestion rhinorrhea and a cough.  She has not had any fever unlike the patient.  His blood pressure here is 90/56.  Immediately upon learning that the patient has been having a fever, he was quarantine to a special room.  I have been put on a gown and an N95 mask prior to examining the patient.  Lungs are clear to auscultation bilaterally.  No wheezes crackles or rails are appreciated.  The patient is sitting comfortably with no distress in a chair.  He is rocking back and forth repetitively.  There is no visible rhinorrhea or congestion.  He is not tender in his abdomen.  His heart is beating in a normal regular sinus rhythm.  I am unable to get a urinalysis because the patient is having a fever and there is a chance that he has Covid and therefore we contaminate our restroom. Past Medical History:  Diagnosis Date  . BPH (benign prostatic hyperplasia)   . Cataract   . Diabetes mellitus   . Hypercholesteremia   . Hypertension    Past Surgical History:  Procedure Laterality Date  . CATARACT EXTRACTION W/PHACO  03/13/2011   Procedure: CATARACT EXTRACTION PHACO AND INTRAOCULAR LENS PLACEMENT (IOC);  Surgeon: Tonny Branch;  Location: [facility];  Service: Ophthalmology;  Laterality: Right;  CDE:  14.58  . EYE SURGERY     left cataract extraction  . FRACTURE SURGERY Right 1980   pin in right knee  . ORIF KNEE DISLOCATION  as child   right    Current Outpatient Medications on File Prior to Visit  Medication Sig Dispense Refill  . Accu-Chek Softclix Lancets lancets Use as instructed 100 each 6  . Acetaminophen 650 MG TABS Take 1 tablet (650 mg total) by mouth 3 (three) times daily as needed. 30 tablet 0  . donepezil (ARICEPT) 10 MG tablet TAKE 1 TABLET AT BEDTIME 90 tablet 2  . doxazosin (CARDURA) 4 MG tablet TAKE 1 TABLET (4 MG TOTAL) BY MOUTH AT BEDTIME. 90 tablet 3  . finasteride (PROSCAR) 5 MG tablet TAKE 1 TABLET EVERY DAY 90 tablet 3  . glipiZIDE (GLUCOTROL) 5 MG tablet Take 5mg  PO Q AM and 2.5mg  PO Q PM. 180 tablet 2  . glucose blood (ACCU-CHEK GUIDE) test strip USE AS DIRECTED TO MONITOR BLOOD SUGAR 3X DAILY. DX: E11.9. 100 each 6  . Insulin Glargine (LANTUS SOLOSTAR) 100 UNIT/ML Solostar Pen Inject 20 Units into the skin at bedtime. 15 mL 2  . Insulin Pen Needle (BD PEN NEEDLE NANO U/F) 32G X 4 MM MISC USE AS DIRECTED TO INJECT LANTUS DAILY 100 each 3  . simvastatin (ZOCOR) 20 MG tablet  TAKE 1 TABLET AT BEDTIME 90 tablet 3   No current facility-administered medications on file prior to visit.   No Known Allergies Social History   Socioeconomic History  . Marital status: Widowed    Spouse name: Not on file  . Number of children: Not on file  . Years of education: Not on file  . Highest education level: Not on file  Occupational History  . Not on file  Tobacco Use  . Smoking status: Former Smoker    Packs/day: 0.25    Years: 20.00    Pack years: 5.00    Types: Cigarettes    Quit date: 03/04/1964    Years since quitting: 55.2  . Smokeless tobacco: Never Used  Substance and Sexual Activity  . Alcohol use: Yes    Comment: very little  . Drug use: No  . Sexual activity: Yes  Other Topics Concern  . Not on file  Social History Narrative  . Not on file    Social Determinants of Health   Financial Resource Strain:   . Difficulty of Paying Living Expenses: Not on file  Food Insecurity:   . Worried About Programme researcher, broadcasting/film/videounning Out of Food in the Last Year: Not on file  . Ran Out of Food in the Last Year: Not on file  Transportation Needs:   . Lack of Transportation (Medical): Not on file  . Lack of Transportation (Non-Medical): Not on file  Physical Activity:   . Days of Exercise per Week: Not on file  . Minutes of Exercise per Session: Not on file  Stress:   . Feeling of Stress : Not on file  Social Connections:   . Frequency of Communication with Friends and Family: Not on file  . Frequency of Social Gatherings with Friends and Family: Not on file  . Attends Religious Services: Not on file  . Active Member of Clubs or Organizations: Not on file  . Attends BankerClub or Organization Meetings: Not on file  . Marital Status: Not on file  Intimate Partner Violence:   . Fear of Current or Ex-Partner: Not on file  . Emotionally Abused: Not on file  . Physically Abused: Not on file  . Sexually Abused: Not on file  '  Review of Systems     Objective:   Physical Exam Constitutional:      General: He is not in acute distress.    Appearance: Normal appearance. He is not ill-appearing, toxic-appearing or diaphoretic.  HENT:     Nose: Nose normal. No congestion or rhinorrhea.  Cardiovascular:     Rate and Rhythm: Normal rate and regular rhythm.  Pulmonary:     Effort: Pulmonary effort is normal. No respiratory distress.     Breath sounds: Normal breath sounds. No wheezing, rhonchi or rales.  Abdominal:     General: Abdomen is flat. Bowel sounds are normal. There is no distension.     Palpations: Abdomen is soft.     Tenderness: There is no abdominal tenderness. There is no guarding or rebound.  Neurological:     Mental Status: He is alert.           Assessment & Plan:  Exposure to COVID-19 virus - Plan: SARS-COV-2 RNA,(COVID-19) QUAL NAAT   Patient is dizzy due to hypotension.  I recommended discontinuing doxazosin.  I recommended pushing fluids.  I believe his hypotension is likely due to a combination of dehydration and what ever is causing his fever.  Patient does not appear septic at  the present time.  He is not tachycardic.  He appears well-hydrated.  Therefore in addition to pushing fluids I would recommend treating the patient for possible infection.  Unfortunately I cannot get a urinalysis as the patient is now on quarantine and this would contaminate our restroom.  Therefore I will treat the patient empirically with Levaquin 500 mg daily for 7 days for possible urinary tract infection/prostatitis.  This would also cover possible sinusitis.  Physical exam, the lungs sound clear and I doubt pneumonia.  The other possibility would be COVID-19.  I did screen the patient for COVID-19 today with a nasal swab and will await the results.  I recommended that the family quarantine until they all get tested as both the daughter and the son have similar symptoms.    If the patient's condition worsens, he is directed to go immediately to the hospital.

## 2019-06-01 ENCOUNTER — Other Ambulatory Visit: Payer: Self-pay

## 2019-06-01 ENCOUNTER — Telehealth: Payer: Self-pay | Admitting: *Deleted

## 2019-06-01 ENCOUNTER — Emergency Department: Payer: Medicare Other

## 2019-06-01 ENCOUNTER — Encounter: Payer: Self-pay | Admitting: Emergency Medicine

## 2019-06-01 ENCOUNTER — Emergency Department
Admission: EM | Admit: 2019-06-01 | Discharge: 2019-06-01 | Disposition: A | Discharge status: Home / Self Care | Payer: Medicare Other | Attending: Emergency Medicine | Admitting: Emergency Medicine

## 2019-06-01 DIAGNOSIS — Z794 Long term (current) use of insulin: Secondary | ICD-10-CM | POA: Insufficient documentation

## 2019-06-01 DIAGNOSIS — R509 Fever, unspecified: Secondary | ICD-10-CM | POA: Diagnosis present

## 2019-06-01 DIAGNOSIS — I10 Essential (primary) hypertension: Secondary | ICD-10-CM | POA: Diagnosis not present

## 2019-06-01 DIAGNOSIS — I129 Hypertensive chronic kidney disease with stage 1 through stage 4 chronic kidney disease, or unspecified chronic kidney disease: Secondary | ICD-10-CM | POA: Insufficient documentation

## 2019-06-01 DIAGNOSIS — U071 COVID-19: Secondary | ICD-10-CM

## 2019-06-01 DIAGNOSIS — Z87891 Personal history of nicotine dependence: Secondary | ICD-10-CM | POA: Diagnosis not present

## 2019-06-01 DIAGNOSIS — R4182 Altered mental status, unspecified: Secondary | ICD-10-CM | POA: Diagnosis not present

## 2019-06-01 DIAGNOSIS — E1122 Type 2 diabetes mellitus with diabetic chronic kidney disease: Secondary | ICD-10-CM | POA: Insufficient documentation

## 2019-06-01 DIAGNOSIS — Z79899 Other long term (current) drug therapy: Secondary | ICD-10-CM | POA: Insufficient documentation

## 2019-06-01 DIAGNOSIS — N182 Chronic kidney disease, stage 2 (mild): Secondary | ICD-10-CM | POA: Insufficient documentation

## 2019-06-01 LAB — URINALYSIS, COMPLETE (UACMP) WITH MICROSCOPIC
Bacteria, UA: NONE SEEN
Bilirubin Urine: NEGATIVE
Glucose, UA: >=500 mg/dL — AB
Ketones, ur: 5 mg/dL — AB
Leukocytes,Ua: NEGATIVE
Nitrite: NEGATIVE
Protein, ur: 100 mg/dL — AB
Specific Gravity, Urine: 1.03 (ref 1.005–1.030)
pH: 5 (ref 5.0–8.0)

## 2019-06-01 LAB — CBC WITH DIFFERENTIAL/PLATELET
Abs Immature Granulocytes: 0.01 10*3/uL (ref 0.00–0.07)
Basophils Absolute: 0 10*3/uL (ref 0.0–0.1)
Basophils Relative: 0 %
Eosinophils Absolute: 0 10*3/uL (ref 0.0–0.5)
Eosinophils Relative: 0 %
HCT: 42 % (ref 39.0–52.0)
Hemoglobin: 14 g/dL (ref 13.0–17.0)
Immature Granulocytes: 0 %
Lymphocytes Relative: 22 %
Lymphs Abs: 1.3 10*3/uL (ref 0.7–4.0)
MCH: 30.1 pg (ref 26.0–34.0)
MCHC: 33.3 g/dL (ref 30.0–36.0)
MCV: 90.3 fL (ref 80.0–100.0)
Monocytes Absolute: 0.3 10*3/uL (ref 0.1–1.0)
Monocytes Relative: 6 %
Neutro Abs: 4.2 10*3/uL (ref 1.7–7.7)
Neutrophils Relative %: 72 %
Platelets: 98 10*3/uL — ABNORMAL LOW (ref 150–400)
RBC: 4.65 MIL/uL (ref 4.22–5.81)
RDW: 12.9 % (ref 11.5–15.5)
WBC: 5.8 10*3/uL (ref 4.0–10.5)
nRBC: 0 % (ref 0.0–0.2)

## 2019-06-01 LAB — COMPREHENSIVE METABOLIC PANEL
ALT: 17 U/L (ref 0–44)
AST: 31 U/L (ref 15–41)
Albumin: 3.6 g/dL (ref 3.5–5.0)
Alkaline Phosphatase: 92 U/L (ref 38–126)
Anion gap: 14 (ref 5–15)
BUN: 20 mg/dL (ref 8–23)
CO2: 21 mmol/L — ABNORMAL LOW (ref 22–32)
Calcium: 8.5 mg/dL — ABNORMAL LOW (ref 8.9–10.3)
Chloride: 97 mmol/L — ABNORMAL LOW (ref 98–111)
Creatinine, Ser: 1.34 mg/dL — ABNORMAL HIGH (ref 0.61–1.24)
GFR calc Af Amer: 54 mL/min — ABNORMAL LOW (ref >60)
GFR calc non Af Amer: 46 mL/min — ABNORMAL LOW (ref >60)
Glucose, Bld: 365 mg/dL — ABNORMAL HIGH (ref 70–99)
Potassium: 4.5 mmol/L (ref 3.5–5.1)
Sodium: 132 mmol/L — ABNORMAL LOW (ref 135–145)
Total Bilirubin: 0.6 mg/dL (ref 0.3–1.2)
Total Protein: 7.3 g/dL (ref 6.5–8.1)

## 2019-06-01 LAB — RESPIRATORY PANEL BY RT PCR (FLU A&B, COVID)
Influenza A by PCR: NEGATIVE
Influenza B by PCR: NEGATIVE
SARS Coronavirus 2 by RT PCR: POSITIVE — AB

## 2019-06-01 LAB — LACTIC ACID, PLASMA
Lactic Acid, Venous: 2.1 mmol/L (ref 0.5–1.9)
Lactic Acid, Venous: 1.9 mmol/L (ref 0.5–1.9)

## 2019-06-01 LAB — TROPONIN I (HIGH SENSITIVITY)
Troponin I (High Sensitivity): 7 ng/L (ref <18)
Troponin I (High Sensitivity): 6 ng/L (ref <18)

## 2019-06-01 LAB — PROCALCITONIN: Procalcitonin: 0.11 ng/mL

## 2019-06-01 MED ORDER — SODIUM CHLORIDE 0.9% FLUSH: 3.0000 mL | Freq: Once | INTRAVENOUS | Status: DC

## 2019-06-01 MED ORDER — SODIUM CHLORIDE 0.9 % IV BOLUS
1000.0000 mL | Freq: Once | INTRAVENOUS | Status: AC
  Administered 2019-06-01: 1000 mL via INTRAVENOUS

## 2019-06-01 NOTE — ED Notes (Signed)
Date and time results received: 06/01/19 12:27 PM   Test: Lactic Critical Value: 2.1  Name of Provider Notified: Jari Pigg

## 2019-06-01 NOTE — Telephone Encounter (Signed)
-----   Message from Susy Frizzle, MD sent at 06/01/2019  6:36 AM EST ----- Please call the patient and check with his family and see how he is doing.  Is he still experiencing fevers after starting antibiotics? Is his blood pressure low after holding medication and pushing fluid?.  If this is still the case I would recommend going to the ER given his age as his condition could worsen.

## 2019-06-01 NOTE — Telephone Encounter (Signed)
Received return call from patient daughter, [name].   BP 129/65, HR 83, T 100.2  Advised to take patient to ER. Verbalized understanding. Call placed to [facility] ER to give report.

## 2019-06-01 NOTE — ED Provider Notes (Signed)
[facility] Emergency Department Provider Note  ____________________________________________   First MD Initiated Contact with Patient 06/01/19 1213     (approximate)  I have reviewed the triage vital signs and the nursing notes.   HISTORY  Chief Complaint Fever and Altered Mental Status    HPI Ethan Davis is a 85 y.o. male with diabetes, hypertension, hypercholesterol who comes in from [facility].  Patient seen in the office yesterday for hypotension and diagnosed with a possible sinus infection.  Patient put on Levaquin yesterday but continues to have fevers.  Patient reported to have a temperature of 100.2 this morning.  Denies any Tylenol this AM however.  Patient does have a history of dementia.  Per family at baseline he is oriented x1 but can normally ambulate better on his own and have recent conversations.  He still oriented x1 but now has been unable to ambulate without assistance of walker and is not being as vocal.  He occasionally is nodding his head.  She states that he has had prior admission for dehydration 1 to 2 years ago.  No known coronavirus contacts.  Patient was tested yesterday but test are still pending.  His fevers have been intermittent, not take anything to help with that, nothing makes it better, nothing makes it worse.   Past Medical History:  Diagnosis Date  . BPH (benign prostatic hyperplasia)   . Cataract   . Diabetes mellitus   . Hypercholesteremia   . Hypertension     Patient Active Problem List   Diagnosis Date Noted  . End of life care 05/03/2018  . Protein-calorie malnutrition (HCC) 11/09/2017  . CKD (chronic kidney disease), stage II 06/27/2017  . Failure to thrive (0-17) 06/27/2017  . Loss of weight 07/04/2014  . Lumbar back pain 12/20/2013  . Cognitive impairment 05/30/2013  . Type 2 diabetes with nephropathy (HCC) 03/02/2013  . Essential hypertension, benign 03/02/2013  . Hyperlipidemia  03/02/2013  . OA (osteoarthritis) 03/02/2013  . Benign prostatic hyperplasia 03/02/2013    Past Surgical History:  Procedure Laterality Date  . CATARACT EXTRACTION W/PHACO  03/13/2011   Procedure: CATARACT EXTRACTION PHACO AND INTRAOCULAR LENS PLACEMENT (IOC);  Surgeon: Gemma PayorKerry Hunt;  Location: [facility];  Service: Ophthalmology;  Laterality: Right;  CDE: 14.58  . EYE SURGERY     left cataract extraction  . FRACTURE SURGERY Right 1980   pin in right knee  . ORIF KNEE DISLOCATION  as child   right     Prior to Admission medications   Medication Sig Start Date End Date Taking? Authorizing Provider  Accu-Chek Softclix Lancets lancets Use as instructed 05/25/19   Salley Scarleturham, Kawanta F, MD  Acetaminophen 650 MG TABS Take 1 tablet (650 mg total) by mouth 3 (three) times daily as needed. 11/14/12   Moreno-Coll, Adlih, MD  donepezil (ARICEPT) 10 MG tablet TAKE 1 TABLET AT BEDTIME 01/18/19   Womelsdorf, Velna HatchetKawanta F, MD  doxazosin (CARDURA) 4 MG tablet TAKE 1 TABLET (4 MG TOTAL) BY MOUTH AT BEDTIME. 01/18/19   Salley Scarleturham, Kawanta F, MD  finasteride (PROSCAR) 5 MG tablet TAKE 1 TABLET EVERY DAY 01/18/19   C[place], Velna HatchetKawanta F, MD  glipiZIDE (GLUCOTROL) 5 MG tablet Take 5mg  PO Q AM and 2.5mg  PO Q PM. 05/27/19   , Velna HatchetKawanta F, MD  glucose blood (ACCU-CHEK GUIDE) test strip USE AS DIRECTED TO MONITOR BLOOD SUGAR 3X DAILY. DX: E11.9. 05/25/19   Salley Scarleturham, Kawanta F, MD  Insulin Glargine (LANTUS SOLOSTAR) 100  UNIT/ML Solostar Pen Inject 20 Units into the skin at bedtime. 02/03/19   Salley Scarlet, MD  Insulin Pen Needle (BD PEN NEEDLE NANO U/F) 32G X 4 MM MISC USE AS DIRECTED TO INJECT LANTUS DAILY 05/25/19   [place], Velna Hatchet, MD  levofloxacin (LEVAQUIN) 500 MG tablet Take 1 tablet (500 mg total) by mouth daily. Please disregard cipro prescription sent earlier. 05/31/19   Donita Brooks, MD  simvastatin (ZOCOR) 20 MG tablet TAKE 1 TABLET AT BEDTIME 01/11/19   Salley Scarlet, MD    Allergies Patient has no known  allergies.  Family History  Problem Relation Age of Onset  . Anesthesia problems Neg Hx   . Hypotension Neg Hx   . Malignant hyperthermia Neg Hx   . Pseudochol deficiency Neg Hx     Social History Social History   Tobacco Use  . Smoking status: Former Smoker    Packs/day: 0.25    Years: 20.00    Pack years: 5.00    Types: Cigarettes    Quit date: 03/04/1964    Years since quitting: 55.2  . Smokeless tobacco: Never Used  Substance Use Topics  . Alcohol use: Yes    Comment: very little  . Drug use: No      Review of Systems Constitutional: Positive fever Eyes: No visual changes. ENT: No sore throat. Cardiovascular: Denies chest pain. Respiratory: Denies shortness of breath. Gastrointestinal: No abdominal pain.  No nausea, no vomiting.  No diarrhea.  No constipation. Genitourinary: Negative for dysuria. Musculoskeletal: Negative for back pain. Skin: Negative for rash. Neurological: Negative for headaches, focal weakness or numbness.  Positive confusion and generalized weakness All other ROS negative ____________________________________________   PHYSICAL EXAM:  VITAL SIGNS: ED Triage Vitals [06/01/19 1053]  Enc Vitals Group     BP (!) 102/57     Pulse Rate 85     Resp 16     Temp 99.3 F (37.4 C)     Temp Source Oral     SpO2 98 %     Weight      Height      Head Circumference      Peak Flow      Pain Score      Pain Loc      Pain Edu?      Excl. in GC?     Constitutional: Alert and oriented x1, no acute distress Eyes: Conjunctivae are normal. EOMI. Head: Atraumatic. Nose: No congestion/rhinnorhea. Mouth/Throat: Mucous membranes are moist.   Neck: No stridor. Trachea Midline. FROM Cardiovascular: Normal rate, regular rhythm. Grossly normal heart sounds.  Good peripheral circulation. Respiratory: Normal respiratory effort.  No retractions. Lungs CTAB. Gastrointestinal: Soft and nontender. No distention. No abdominal bruits.  Musculoskeletal: No  lower extremity tenderness nor edema.  No joint effusions. Neurologic: Patient is oriented x1.  Equal strength in his arms and his legs.  Cranial nerves appear intact. Skin:  Skin is warm, dry and intact. No rash noted. Psychiatric: Difficult to fully assess due to baseline dementia. GU: Deferred   ____________________________________________   LABS (all labs ordered are listed, but only abnormal results are displayed)  Labs Reviewed  RESPIRATORY PANEL BY RT PCR (FLU A&B, COVID) - Abnormal; Notable for the following components:      Result Value   SARS Coronavirus 2 by RT PCR POSITIVE (*)    All other components within normal limits  LACTIC ACID, PLASMA - Abnormal; Notable for the following components:   Lactic Acid,  Venous 2.1 (*)    All other components within normal limits  COMPREHENSIVE METABOLIC PANEL - Abnormal; Notable for the following components:   Sodium 132 (*)    Chloride 97 (*)    CO2 21 (*)    Glucose, Bld 365 (*)    Creatinine, Ser 1.34 (*)    Calcium 8.5 (*)    GFR calc non Af Amer 46 (*)    GFR calc Af Amer 54 (*)    All other components within normal limits  CBC WITH DIFFERENTIAL/PLATELET - Abnormal; Notable for the following components:   Platelets 98 (*)    All other components within normal limits  URINALYSIS, COMPLETE (UACMP) WITH MICROSCOPIC - Abnormal; Notable for the following components:   Color, Urine YELLOW (*)    APPearance CLOUDY (*)    Glucose, UA >=500 (*)    Hgb urine dipstick SMALL (*)    Ketones, ur 5 (*)    Protein, ur 100 (*)    All other components within normal limits  URINE CULTURE  LACTIC ACID, PLASMA  PROCALCITONIN  PROCALCITONIN  TROPONIN I (HIGH SENSITIVITY)  TROPONIN I (HIGH SENSITIVITY)   ____________________________________________   ED ECG REPORT I, Vanessa [employer], the attending physician, personally viewed and interpreted this ECG.  Initial EKG difficult to interpret due to poor baseline.  Will get repeat  EKG.  Repeat EKG is normal sinus rate of 90, no ST elevation, no T wave inversion, normal intervals ____________________________________________  RADIOLOGY Robert Bellow, personally viewed and evaluated these images (plain radiographs) as part of my medical decision making, as well as reviewing the written report by the radiologist.  ED MD interpretation: No evidence of pneumonia.   Official radiology report(s): DG Chest 2 View  Result Date: 06/01/2019 CLINICAL DATA:  Hypotension.  Sinus infection. EXAM: CHEST - 2 VIEW COMPARISON:  Single-view of the chest 06/27/2017. FINDINGS: Lung volumes are low with mild basilar atelectasis. No consolidative process, pneumothorax or effusion. Heart size is normal. Calcified mediastinal lymph nodes and calcified right lower lobe granuloma are unchanged. No acute or focal bony abnormality. IMPRESSION: No acute disease. Electronically Signed   By: Inge Rise M.D.   On: 06/01/2019 11:29    ____________________________________________   PROCEDURES  Procedure(s) performed (including Critical Care):  Procedures   ____________________________________________   INITIAL IMPRESSION / ASSESSMENT AND PLAN / ED COURSE  Steva Colder was evaluated in Emergency Department on 06/01/2019 for the symptoms described in the history of present illness. He was evaluated in the context of the global COVID-19 pandemic, which necessitated consideration that the patient might be at risk for infection with the SARS-CoV-2 virus that causes COVID-19. Institutional protocols and algorithms that pertain to the evaluation of patients at risk for COVID-19 are in a state of rapid change based on information released by regulatory bodies including the CDC and federal and state organizations. These policies and algorithms were followed during the patient's care in the ED.    Patient is a 85 year old who presents with some lower blood pressures and increasing confusion and  generalized weakness.  Will get labs to evaluate for electrolyte abnormalities, AKI.  CT head evaluate for intracranial hemorrhage versus tumor given increasing confusion.  We will get coronavirus testing given his has not resulted yet.  With chest x-ray to evaluate for pneumonia.  Will get urine to evaluate for UTI.   Labs are notable for slightly increase in his creatinine of 1.3 from 1.2.  Patient given 1 L  of fluid.  Sugars elevated but anion gap is normal therefore DKA is unlikely.  Platelets are slightly low but no recent bleeding.  Initial lactate was 2.1.-_repeat normal   Low suspicion for uti so will send for culture given pt unable to tell us if having symptoms.  Patient's coronavirus test was positive.  This is most likely the cause in his mental status.  His CT head was negative.  Daughter states that he is acting more at baseline now.  I discussed with patients daughter about the appropriate quarantine for her adn family.  We also discussed whether or not patient will need to be admitted for inability to take care of himself versus them helping to take care of him at home.  She would prefer to have him go home and they can be with him 24/7 over the next few days.  Patient is not having any hypoxia to require admission and his electrolytes all look okay.  Respiratory wise he has no increased work of breathing.  She understands that if he develops increasing weakness that he can return to the ER or if he develops worsening shortness of breath.  I discussed the provisional nature of ED diagnosis, the treatment so far, the ongoing plan of care, follow up appointments and return precautions with the patient and any family or support people present. They expressed understanding and agreed with the plan, discharged home.    ____________________________________________   FINAL CLINICAL IMPRESSION(S) / ED DIAGNOSES   Final diagnoses:  COVID-19 virus detected      MEDICATIONS  GIVEN DURING THIS VISIT:  Medications  sodium chloride flush (NS) 0.9 % injection 3 mL (3 mLs Intravenous Not Given 06/01/19 1208)  sodium chloride 0.9 % bolus 1,000 mL (0 mLs Intravenous Stopped 06/01/19 1554)     ED Discharge Orders    None       Note:  This document was prepared using Dragon voice recognition software and may include unintentional dictation errors.   Concha Se, MD 06/01/19 [phone]

## 2019-06-01 NOTE — ED Triage Notes (Signed)
This RN received phone from [facility] that patient was coming to ED. Per provider at [place], pt seen in office yesterday for hypotension, dx with possible sinus infection x 3 days, per provider at PCP officer stopped patient's cardura yesterday due to hypotension in officer and started levaquin, provider states pt has had continued fever despite started abx yesterday. Pt with hx of dementia, confused at baseline. Per provider, pt had covid swab and is currently awaiting Covid results.

## 2019-06-01 NOTE — ED Notes (Signed)
Dr. Jimmye Norman signed off on repeat EKG, this RN explained labs ordered per protocol, per Dr. Jimmye Norman add on troponin as well.

## 2019-06-01 NOTE — Telephone Encounter (Signed)
Call placed to patient daughter [name]. [place].

## 2019-06-01 NOTE — ED Notes (Signed)
Date and time results received: 06/01/19 12:36 PM    Test: Glucose Critical Value: 365  Name of Provider Notified: Dr. Jari Pigg

## 2019-06-01 NOTE — Telephone Encounter (Signed)
Received call from Jack Quarto, patient daughter.   Reports that patient has began ABTx and was not noted to have fever last night. Will re-check this AM. Also states that she held medications as directed by MD, but patient continued to voice C/O dizziness. Will re-check his BP this AM and return call with update.

## 2019-06-01 NOTE — Discharge Instructions (Addendum)
Patient was positive for coronavirus is most likely causing his weakness.  You take Tylenol 1 g every 8 hours for discomfort or fevers.  Return to the ER if he develops worsening weakness or shortness of breath or any other concerns.  We sent urine for culture. We will call if positive. Probably not necessary to take the levaquin given he has covid 19.        Person Under Monitoring Name: Ethan Davis  Location: [address] [place] [address]   Infection Prevention Recommendations for Individuals Confirmed to have, or Being Evaluated for, 2019 Novel Coronavirus (COVID-19) Infection Who Receive Care at Home  Individuals who are confirmed to have, or are being evaluated for, COVID-19 should follow the prevention steps below until a healthcare provider or local or state health department says they can return to normal activities.  Stay home except to get medical care You should restrict activities outside your home, except for getting medical care. Do not go to work, school, or public areas, and do not use public transportation or taxis.  Call ahead before visiting your doctor Before your medical appointment, call the healthcare provider and tell them that you have, or are being evaluated for, COVID-19 infection. This will help the healthcare provider's office take steps to keep other people from getting infected. Ask your healthcare provider to call the local or state health department.  Monitor your symptoms Seek prompt medical attention if your illness is worsening (e.g., difficulty breathing). Before going to your medical appointment, call the healthcare provider and tell them that you have, or are being evaluated for, COVID-19 infection. Ask your healthcare provider to call the local or state health department.  Wear a facemask You should wear a facemask that covers your nose and mouth when you are in the same room with other people and when you visit a healthcare  provider. People who live with or visit you should also wear a facemask while they are in the same room with you.  Separate yourself from other people in your home As much as possible, you should stay in a different room from other people in your home. Also, you should use a separate bathroom, if available.  Avoid sharing household items You should not share dishes, drinking glasses, cups, eating utensils, towels, bedding, or other items with other people in your home. After using these items, you should wash them thoroughly with soap and water.  Cover your coughs and sneezes Cover your mouth and nose with a tissue when you cough or sneeze, or you can cough or sneeze into your sleeve. Throw used tissues in a lined trash can, and immediately wash your hands with soap and water for at least 20 seconds or use an alcohol-based hand rub.  Wash your [employer] your hands often and thoroughly with soap and water for at least 20 seconds. You can use an alcohol-based hand sanitizer if soap and water are not available and if your hands are not visibly dirty. Avoid touching your eyes, nose, and mouth with unwashed hands.   Prevention Steps for Caregivers and Household Members of Individuals Confirmed to have, or Being Evaluated for, COVID-19 Infection Being Cared for in the Home  If you live with, or provide care at home for, a person confirmed to have, or being evaluated for, COVID-19 infection please follow these guidelines to prevent infection:  Follow healthcare provider's instructions Make sure that you understand and can help the patient follow any healthcare  provider instructions for all care.  Provide for the patient's basic needs You should help the patient with basic needs in the home and provide support for getting groceries, prescriptions, and other personal needs.  Monitor the patient's symptoms If they are getting sicker, call his or her medical provider and tell them that the  patient has, or is being evaluated for, COVID-19 infection. This will help the healthcare provider's office take steps to keep other people from getting infected. Ask the healthcare provider to call the local or state health department.  Limit the number of people who have contact with the patient If possible, have only one caregiver for the patient. Other household members should stay in another home or place of residence. If this is not possible, they should stay in another room, or be separated from the patient as much as possible. Use a separate bathroom, if available. Restrict visitors who do not have an essential need to be in the home.  Keep older adults, very young children, and other sick people away from the patient Keep older adults, very young children, and those who have compromised immune systems or chronic health conditions away from the patient. This includes people with chronic heart, lung, or kidney conditions, diabetes, and cancer.  Ensure good ventilation Make sure that shared spaces in the home have good air flow, such as from an air conditioner or an opened window, weather permitting.  Wash your hands often Wash your hands often and thoroughly with soap and water for at least 20 seconds. You can use an alcohol based hand sanitizer if soap and water are not available and if your hands are not visibly dirty. Avoid touching your eyes, nose, and mouth with unwashed hands. Use disposable paper towels to dry your hands. If not available, use dedicated cloth towels and replace them when they become wet.  Wear a facemask and gloves Wear a disposable facemask at all times in the room and gloves when you touch or have contact with the patient's blood, body fluids, and/or secretions or excretions, such as sweat, saliva, sputum, nasal mucus, vomit, urine, or feces.  Ensure the mask fits over your nose and mouth tightly, and do not touch it during use. Throw out disposable facemasks  and gloves after using them. Do not reuse. Wash your hands immediately after removing your facemask and gloves. If your personal clothing becomes contaminated, carefully remove clothing and launder. Wash your hands after handling contaminated clothing. Place all used disposable facemasks, gloves, and other waste in a lined container before disposing them with other household waste. Remove gloves and wash your hands immediately after handling these items.  Do not share dishes, glasses, or other household items with the patient Avoid sharing household items. You should not share dishes, drinking glasses, cups, eating utensils, towels, bedding, or other items with a patient who is confirmed to have, or being evaluated for, COVID-19 infection. After the person uses these items, you should wash them thoroughly with soap and water.  Wash laundry thoroughly Immediately remove and wash clothes or bedding that have blood, body fluids, and/or secretions or excretions, such as sweat, saliva, sputum, nasal mucus, vomit, urine, or feces, on them. Wear gloves when handling laundry from the patient. Read and follow directions on labels of laundry or clothing items and detergent. In general, wash and dry with the warmest temperatures recommended on the label.  Clean all areas the individual has used often Clean all touchable surfaces, such as counters, tabletops, doorknobs,  bathroom fixtures, toilets, phones, keyboards, tablets, and bedside tables, every day. Also, clean any surfaces that may have blood, body fluids, and/or secretions or excretions on them. Wear gloves when cleaning surfaces the patient has come in contact with. Use a diluted bleach solution (e.g., dilute bleach with 1 part bleach and 10 parts water) or a household disinfectant with a label that says EPA-registered for coronaviruses. To make a bleach solution at home, add 1 tablespoon of bleach to 1 quart (4 cups) of water. For a larger supply,  add  cup of bleach to 1 gallon (16 cups) of water. Read labels of cleaning products and follow recommendations provided on product labels. Labels contain instructions for safe and effective use of the cleaning product including precautions you should take when applying the product, such as wearing gloves or eye protection and making sure you have good ventilation during use of the product. Remove gloves and wash hands immediately after cleaning.  Monitor yourself for signs and symptoms of illness Caregivers and household members are considered close contacts, should monitor their health, and will be asked to limit movement outside of the home to the extent possible. Follow the monitoring steps for close contacts listed on the symptom monitoring form.   ? If you have additional questions, contact your local health department or call the epidemiologist on call at [phone] (available 24/7). ? This guidance is subject to change. For the most up-to-date guidance from [facility], please refer to their website: TripMetro.huhttps://www.cdc.gov/coronavirus/2019-ncov/hcp/guidance-prevent-spread.html

## 2019-06-01 NOTE — ED Notes (Signed)
EKG obtained by Linus Orn, EDT, signed off by Dr. Jimmye Norman, per Dr. Jimmye Norman repeat EKG. Unable to obtain it patient having CP at this time due to hx of dementia, pt does not appears to have CP at this time.

## 2019-06-02 ENCOUNTER — Telehealth: Payer: Self-pay | Admitting: Infectious Diseases

## 2019-06-02 LAB — SARS-COV-2 RNA,(COVID-19) QUALITATIVE NAAT: SARS CoV2 RNA: DETECTED — AB

## 2019-06-02 NOTE — Telephone Encounter (Signed)
Called to discuss with patient about Covid symptoms and the use of bamlanivimab, a monoclonal antibody infusion for those with mild to moderate Covid symptoms and at a high risk of hospitalization.  Pt is qualified for this infusion at the [facility] infusion center due to Age > 65   Based on ER visit his sx started 12/21  Message left to call back.

## 2019-06-03 LAB — URINE CULTURE: Culture: NO GROWTH

## 2019-06-06 ENCOUNTER — Emergency Department (HOSPITAL_COMMUNITY): Payer: Medicare Other

## 2019-06-06 ENCOUNTER — Encounter (HOSPITAL_COMMUNITY): Payer: Self-pay

## 2019-06-06 ENCOUNTER — Inpatient Hospital Stay (HOSPITAL_COMMUNITY)
Admission: EM | Admit: 2019-06-06 | Discharge: 2019-07-11 | DRG: 177 | Disposition: E | Payer: Medicare Other | Attending: Internal Medicine | Admitting: Internal Medicine

## 2019-06-06 ENCOUNTER — Other Ambulatory Visit: Payer: Self-pay

## 2019-06-06 DIAGNOSIS — J9601 Acute respiratory failure with hypoxia: Secondary | ICD-10-CM | POA: Diagnosis present

## 2019-06-06 DIAGNOSIS — Z794 Long term (current) use of insulin: Secondary | ICD-10-CM

## 2019-06-06 DIAGNOSIS — F0280 Dementia in other diseases classified elsewhere without behavioral disturbance: Secondary | ICD-10-CM | POA: Diagnosis present

## 2019-06-06 DIAGNOSIS — E785 Hyperlipidemia, unspecified: Secondary | ICD-10-CM | POA: Diagnosis present

## 2019-06-06 DIAGNOSIS — Z79899 Other long term (current) drug therapy: Secondary | ICD-10-CM | POA: Diagnosis not present

## 2019-06-06 DIAGNOSIS — R627 Adult failure to thrive: Secondary | ICD-10-CM | POA: Diagnosis present

## 2019-06-06 DIAGNOSIS — U071 COVID-19: Secondary | ICD-10-CM | POA: Diagnosis not present

## 2019-06-06 DIAGNOSIS — R64 Cachexia: Secondary | ICD-10-CM | POA: Diagnosis present

## 2019-06-06 DIAGNOSIS — F03918 Unspecified dementia, unspecified severity, with other behavioral disturbance: Secondary | ICD-10-CM | POA: Diagnosis present

## 2019-06-06 DIAGNOSIS — G9341 Metabolic encephalopathy: Secondary | ICD-10-CM | POA: Diagnosis not present

## 2019-06-06 DIAGNOSIS — R54 Age-related physical debility: Secondary | ICD-10-CM | POA: Diagnosis present

## 2019-06-06 DIAGNOSIS — J1282 Pneumonia due to coronavirus disease 2019: Secondary | ICD-10-CM | POA: Diagnosis present

## 2019-06-06 DIAGNOSIS — J1289 Other viral pneumonia: Secondary | ICD-10-CM | POA: Diagnosis not present

## 2019-06-06 DIAGNOSIS — Z4682 Encounter for fitting and adjustment of non-vascular catheter: Secondary | ICD-10-CM | POA: Diagnosis not present

## 2019-06-06 DIAGNOSIS — Z4659 Encounter for fitting and adjustment of other gastrointestinal appliance and device: Secondary | ICD-10-CM

## 2019-06-06 DIAGNOSIS — Z66 Do not resuscitate: Secondary | ICD-10-CM | POA: Diagnosis not present

## 2019-06-06 DIAGNOSIS — E7800 Pure hypercholesterolemia, unspecified: Secondary | ICD-10-CM | POA: Diagnosis present

## 2019-06-06 DIAGNOSIS — R7989 Other specified abnormal findings of blood chemistry: Secondary | ICD-10-CM | POA: Diagnosis present

## 2019-06-06 DIAGNOSIS — R0902 Hypoxemia: Secondary | ICD-10-CM

## 2019-06-06 DIAGNOSIS — R0602 Shortness of breath: Secondary | ICD-10-CM | POA: Diagnosis not present

## 2019-06-06 DIAGNOSIS — G309 Alzheimer's disease, unspecified: Secondary | ICD-10-CM | POA: Diagnosis present

## 2019-06-06 DIAGNOSIS — F0150 Vascular dementia without behavioral disturbance: Secondary | ICD-10-CM | POA: Diagnosis not present

## 2019-06-06 DIAGNOSIS — E440 Moderate protein-calorie malnutrition: Secondary | ICD-10-CM | POA: Diagnosis not present

## 2019-06-06 DIAGNOSIS — E1140 Type 2 diabetes mellitus with diabetic neuropathy, unspecified: Secondary | ICD-10-CM | POA: Diagnosis present

## 2019-06-06 DIAGNOSIS — IMO0002 Reserved for concepts with insufficient information to code with codable children: Secondary | ICD-10-CM | POA: Diagnosis present

## 2019-06-06 DIAGNOSIS — E43 Unspecified severe protein-calorie malnutrition: Secondary | ICD-10-CM | POA: Diagnosis not present

## 2019-06-06 DIAGNOSIS — I959 Hypotension, unspecified: Secondary | ICD-10-CM | POA: Diagnosis present

## 2019-06-06 DIAGNOSIS — Z6823 Body mass index (BMI) 23.0-23.9, adult: Secondary | ICD-10-CM | POA: Diagnosis not present

## 2019-06-06 DIAGNOSIS — E1121 Type 2 diabetes mellitus with diabetic nephropathy: Secondary | ICD-10-CM | POA: Diagnosis present

## 2019-06-06 DIAGNOSIS — I10 Essential (primary) hypertension: Secondary | ICD-10-CM | POA: Diagnosis present

## 2019-06-06 DIAGNOSIS — R791 Abnormal coagulation profile: Secondary | ICD-10-CM | POA: Diagnosis not present

## 2019-06-06 DIAGNOSIS — Z87891 Personal history of nicotine dependence: Secondary | ICD-10-CM

## 2019-06-06 DIAGNOSIS — E875 Hyperkalemia: Secondary | ICD-10-CM | POA: Diagnosis not present

## 2019-06-06 DIAGNOSIS — N400 Enlarged prostate without lower urinary tract symptoms: Secondary | ICD-10-CM | POA: Diagnosis present

## 2019-06-06 DIAGNOSIS — J9600 Acute respiratory failure, unspecified whether with hypoxia or hypercapnia: Secondary | ICD-10-CM | POA: Insufficient documentation

## 2019-06-06 DIAGNOSIS — I82411 Acute embolism and thrombosis of right femoral vein: Secondary | ICD-10-CM | POA: Diagnosis present

## 2019-06-06 DIAGNOSIS — E1165 Type 2 diabetes mellitus with hyperglycemia: Secondary | ICD-10-CM | POA: Diagnosis not present

## 2019-06-06 DIAGNOSIS — E46 Unspecified protein-calorie malnutrition: Secondary | ICD-10-CM | POA: Diagnosis present

## 2019-06-06 DIAGNOSIS — F0391 Unspecified dementia with behavioral disturbance: Secondary | ICD-10-CM | POA: Diagnosis present

## 2019-06-06 DIAGNOSIS — E0842 Diabetes mellitus due to underlying condition with diabetic polyneuropathy: Secondary | ICD-10-CM | POA: Diagnosis not present

## 2019-06-06 DIAGNOSIS — E118 Type 2 diabetes mellitus with unspecified complications: Secondary | ICD-10-CM | POA: Diagnosis not present

## 2019-06-06 DIAGNOSIS — E870 Hyperosmolality and hypernatremia: Secondary | ICD-10-CM | POA: Diagnosis not present

## 2019-06-06 LAB — COMPREHENSIVE METABOLIC PANEL
ALT: 20 U/L (ref 0–44)
AST: 35 U/L (ref 15–41)
Albumin: 3.1 g/dL — ABNORMAL LOW (ref 3.5–5.0)
Alkaline Phosphatase: 92 U/L (ref 38–126)
Anion gap: 13 (ref 5–15)
BUN: 21 mg/dL (ref 8–23)
CO2: 27 mmol/L (ref 22–32)
Calcium: 8.9 mg/dL (ref 8.9–10.3)
Chloride: 101 mmol/L (ref 98–111)
Creatinine, Ser: 1.13 mg/dL (ref 0.61–1.24)
GFR calc Af Amer: >60 mL/min (ref >60)
GFR calc non Af Amer: 57 mL/min — ABNORMAL LOW (ref >60)
Glucose, Bld: 245 mg/dL — ABNORMAL HIGH (ref 70–99)
Potassium: 4.3 mmol/L (ref 3.5–5.1)
Sodium: 141 mmol/L (ref 135–145)
Total Bilirubin: 1.1 mg/dL (ref 0.3–1.2)
Total Protein: 7.7 g/dL (ref 6.5–8.1)

## 2019-06-06 LAB — CBC WITH DIFFERENTIAL/PLATELET
Abs Immature Granulocytes: 0.08 10*3/uL — ABNORMAL HIGH (ref 0.00–0.07)
Basophils Absolute: 0 10*3/uL (ref 0.0–0.1)
Basophils Relative: 0 %
Eosinophils Absolute: 0 10*3/uL (ref 0.0–0.5)
Eosinophils Relative: 0 %
HCT: 48.6 % (ref 39.0–52.0)
Hemoglobin: 15.2 g/dL (ref 13.0–17.0)
Immature Granulocytes: 1 %
Lymphocytes Relative: 14 %
Lymphs Abs: 1 10*3/uL (ref 0.7–4.0)
MCH: 29.4 pg (ref 26.0–34.0)
MCHC: 31.3 g/dL (ref 30.0–36.0)
MCV: 94 fL (ref 80.0–100.0)
Monocytes Absolute: 0.5 10*3/uL (ref 0.1–1.0)
Monocytes Relative: 8 %
Neutro Abs: 5.5 10*3/uL (ref 1.7–7.7)
Neutrophils Relative %: 77 %
Platelets: 186 10*3/uL (ref 150–400)
RBC: 5.17 MIL/uL (ref 4.22–5.81)
RDW: 13 % (ref 11.5–15.5)
WBC: 7 10*3/uL (ref 4.0–10.5)
nRBC: 0 % (ref 0.0–0.2)

## 2019-06-06 LAB — RESPIRATORY PANEL BY RT PCR (FLU A&B, COVID)
Influenza A by PCR: NEGATIVE
Influenza B by PCR: NEGATIVE
SARS Coronavirus 2 by RT PCR: POSITIVE — AB

## 2019-06-06 LAB — MAGNESIUM: Magnesium: 2.4 mg/dL (ref 1.7–2.4)

## 2019-06-06 MED ORDER — SODIUM CHLORIDE 0.9 % IV SOLN: INTRAVENOUS | Status: AC

## 2019-06-06 MED ORDER — ASPIRIN EC 81 MG PO TBEC
81.0000 mg | DELAYED_RELEASE_TABLET | Freq: Every day | ORAL | Status: DC
  Administered 2019-06-07 – 2019-06-12 (×6): 81 mg via ORAL
  Filled 2019-06-06 (×6): qty 1

## 2019-06-06 MED ORDER — FINASTERIDE 5 MG PO TABS
5.0000 mg | ORAL_TABLET | Freq: Every day | ORAL | Status: DC
  Administered 2019-06-09 – 2019-06-12 (×4): 5 mg via ORAL
  Filled 2019-06-06 (×8): qty 1

## 2019-06-06 MED ORDER — INSULIN ASPART 100 UNIT/ML ~~LOC~~ SOLN
0.0000 [IU] | Freq: Three times a day (TID) | SUBCUTANEOUS | Status: DC
  Administered 2019-06-07: 2 [IU] via SUBCUTANEOUS
  Administered 2019-06-07 (×2): 5 [IU] via SUBCUTANEOUS
  Administered 2019-06-08: 7 [IU] via SUBCUTANEOUS

## 2019-06-06 MED ORDER — VITAMIN D 25 MCG (1000 UNIT) PO TABS
5000.0000 [IU] | ORAL_TABLET | Freq: Every day | ORAL | Status: DC
  Administered 2019-06-07 – 2019-06-11 (×5): 5000 [IU] via ORAL
  Filled 2019-06-06 (×8): qty 5

## 2019-06-06 MED ORDER — ASCORBIC ACID 500 MG PO TABS
500.0000 mg | ORAL_TABLET | Freq: Every day | ORAL | Status: DC
  Administered 2019-06-07 – 2019-06-11 (×5): 500 mg via ORAL
  Filled 2019-06-06 (×5): qty 1

## 2019-06-06 MED ORDER — DEXAMETHASONE SODIUM PHOSPHATE 10 MG/ML IJ SOLN
6.0000 mg | INTRAMUSCULAR | Status: DC
  Administered 2019-06-06 – 2019-06-12 (×7): 6 mg via INTRAVENOUS
  Filled 2019-06-06 (×8): qty 1

## 2019-06-06 MED ORDER — INSULIN GLARGINE 100 UNIT/ML ~~LOC~~ SOLN
10.0000 [IU] | Freq: Every day | SUBCUTANEOUS | Status: DC
  Administered 2019-06-07 – 2019-06-08 (×2): 10 [IU] via SUBCUTANEOUS
  Filled 2019-06-06 (×3): qty 0.1

## 2019-06-06 MED ORDER — QUETIAPINE FUMARATE 25 MG PO TABS
25.0000 mg | ORAL_TABLET | Freq: Two times a day (BID) | ORAL | Status: DC
  Administered 2019-06-07 (×3): 25 mg via ORAL
  Filled 2019-06-06 (×3): qty 1

## 2019-06-06 MED ORDER — ACETAMINOPHEN 325 MG PO TABS
650.0000 mg | ORAL_TABLET | Freq: Four times a day (QID) | ORAL | Status: DC | PRN
  Administered 2019-06-10: 22:00:00 650 mg via ORAL

## 2019-06-06 MED ORDER — SODIUM CHLORIDE 0.9 % IV BOLUS
1000.0000 mL | Freq: Once | INTRAVENOUS | Status: AC
  Administered 2019-06-06: 1000 mL via INTRAVENOUS

## 2019-06-06 MED ORDER — SODIUM CHLORIDE 0.9 % IV SOLN
200.0000 mg | Freq: Once | INTRAVENOUS | Status: DC
  Filled 2019-06-06 (×2): qty 40

## 2019-06-06 MED ORDER — ACETAMINOPHEN 650 MG RE SUPP: 650.0000 mg | Freq: Four times a day (QID) | RECTAL | Status: DC | PRN

## 2019-06-06 MED ORDER — ONDANSETRON HCL 4 MG PO TABS: 4.0000 mg | ORAL_TABLET | Freq: Four times a day (QID) | ORAL | Status: DC | PRN

## 2019-06-06 MED ORDER — ONDANSETRON HCL 4 MG/2ML IJ SOLN: 4.0000 mg | Freq: Four times a day (QID) | INTRAMUSCULAR | Status: DC | PRN

## 2019-06-06 MED ORDER — ZINC SULFATE 220 (50 ZN) MG PO CAPS
220.0000 mg | ORAL_CAPSULE | Freq: Every day | ORAL | Status: DC
  Administered 2019-06-07 – 2019-06-11 (×5): 220 mg via ORAL
  Filled 2019-06-06 (×5): qty 1

## 2019-06-06 MED ORDER — ENOXAPARIN SODIUM 40 MG/0.4ML ~~LOC~~ SOLN
40.0000 mg | SUBCUTANEOUS | Status: DC
  Filled 2019-06-06: qty 0.4

## 2019-06-06 MED ORDER — HALOPERIDOL LACTATE 5 MG/ML IJ SOLN
1.0000 mg | Freq: Four times a day (QID) | INTRAMUSCULAR | Status: DC | PRN
  Administered 2019-06-07 (×2): 1 mg via INTRAVENOUS
  Filled 2019-06-06 (×3): qty 1

## 2019-06-06 MED ORDER — HYDRALAZINE HCL 20 MG/ML IJ SOLN: 5.0000 mg | INTRAMUSCULAR | Status: DC | PRN

## 2019-06-06 MED ORDER — ACETAMINOPHEN 325 MG PO TABS
650.0000 mg | ORAL_TABLET | Freq: Four times a day (QID) | ORAL | Status: DC | PRN
  Filled 2019-06-06 (×2): qty 2

## 2019-06-06 MED ORDER — SODIUM CHLORIDE 0.9 % IV SOLN
100.0000 mg | Freq: Every day | INTRAVENOUS | Status: DC
  Filled 2019-06-06: qty 20

## 2019-06-06 MED ORDER — SIMVASTATIN 20 MG PO TABS
20.0000 mg | ORAL_TABLET | Freq: Every day | ORAL | Status: DC
  Filled 2019-06-06: qty 1

## 2019-06-06 MED ORDER — THIAMINE HCL 100 MG/ML IJ SOLN
100.0000 mg | Freq: Every day | INTRAMUSCULAR | Status: DC
  Administered 2019-06-07 – 2019-06-12 (×6): 100 mg via INTRAVENOUS
  Filled 2019-06-06 (×6): qty 2

## 2019-06-06 MED ORDER — DONEPEZIL HCL 10 MG PO TABS
10.0000 mg | ORAL_TABLET | Freq: Every day | ORAL | Status: DC
  Administered 2019-06-07 – 2019-06-10 (×5): 10 mg via ORAL
  Filled 2019-06-06 (×7): qty 1

## 2019-06-06 NOTE — ED Provider Notes (Signed)
[facility] Provider Note   CSN: [id number] Arrival date & time: 05/25/2019  1110     History Chief Complaint  Patient presents with  . Altered Mental Status    Ethan ReinJohn I Davis is a 85 y.o. male who is pleasantly demented presenting to the emergency department with family complaints for nausea vomiting dizziness.  The patient reportedly was diagnosed with Covid last week.  He has been eating very little at home.  The family is moving Pedialyte.  They reportedly stated he had an O2 sat as low as 83% at home.  Here patient is 94% on 6L [place]  HPI     Past Medical History:  Diagnosis Date  . BPH (benign prostatic hyperplasia)   . Cataract   . Diabetes mellitus   . Hypercholesteremia   . Hypertension     Patient Active Problem List   Diagnosis Date Noted  . Acute respiratory failure (HCC) 05/31/2019  . COVID-19 virus infection 06/04/2019  . Dementia without behavioral disturbance (HCC) 05/17/2019  . End of life care 05/03/2018  . Protein-calorie malnutrition (HCC) 11/09/2017  . CKD (chronic kidney disease), stage II 06/27/2017  . Failure to thrive (0-17) 06/27/2017  . Loss of weight 07/04/2014  . Lumbar back pain 12/20/2013  . Cognitive impairment 05/30/2013  . Type 2 diabetes with nephropathy (HCC) 03/02/2013  . Essential hypertension, benign 03/02/2013  . Hyperlipidemia 03/02/2013  . OA (osteoarthritis) 03/02/2013  . Benign prostatic hyperplasia 03/02/2013    Past Surgical History:  Procedure Laterality Date  . CATARACT EXTRACTION W/PHACO  03/13/2011   Procedure: CATARACT EXTRACTION PHACO AND INTRAOCULAR LENS PLACEMENT (IOC);  Surgeon: Gemma PayorKerry Hunt;  Location: [facility];  Service: Ophthalmology;  Laterality: Right;  CDE: 14.58  . EYE SURGERY     left cataract extraction  . FRACTURE SURGERY Right 1980   pin in right knee  . ORIF KNEE DISLOCATION  as child   right        Family History  Problem Relation Age of Onset  . Anesthesia problems Neg Hx    . Hypotension Neg Hx   . Malignant hyperthermia Neg Hx   . Pseudochol deficiency Neg Hx     Social History   Tobacco Use  . Smoking status: Former Smoker    Packs/day: 0.25    Years: 20.00    Pack years: 5.00    Types: Cigarettes    Quit date: 03/04/1964    Years since quitting: 55.2  . Smokeless tobacco: Never Used  Substance Use Topics  . Alcohol use: Yes    Comment: very little  . Drug use: No    Home Medications Prior to Admission medications   Medication Sig Start Date End Date Taking? Authorizing Provider  Accu-Chek Softclix Lancets lancets Use as instructed 05/25/19  Yes [place], Velna HatchetKawanta F, MD  Acetaminophen 650 MG TABS Take 1 tablet (650 mg total) by mouth 3 (three) times daily as needed. 11/14/12  Yes Moreno-Coll, Adlih, MD  donepezil (ARICEPT) 10 MG tablet TAKE 1 TABLET AT BEDTIME 01/18/19  Yes [place], Velna HatchetKawanta F, MD  finasteride (PROSCAR) 5 MG tablet TAKE 1 TABLET EVERY DAY 01/18/19  Yes Alianza, Velna HatchetKawanta F, MD  glipiZIDE (GLUCOTROL) 5 MG tablet Take 5mg  PO Q AM and 2.5mg  PO Q PM. 05/27/19  Yes [place], Kingsley SpittleKawanta F, MD  glucose blood (ACCU-CHEK GUIDE) test strip USE AS DIRECTED TO MONITOR BLOOD SUGAR 3X DAILY. DX: E11.9. 05/25/19  Yes [place], Velna HatchetKawanta F, MD  Insulin Glargine (LANTUS SOLOSTAR) 100  UNIT/ML Solostar Pen Inject 20 Units into the skin at bedtime. 02/03/19  Yes [place], Modena Nunnery, MD  Insulin Pen Needle (BD PEN NEEDLE NANO U/F) 32G X 4 MM MISC USE AS DIRECTED TO INJECT LANTUS DAILY 05/25/19  Yes [place], Modena Nunnery, MD  simvastatin (ZOCOR) 20 MG tablet TAKE 1 TABLET AT BEDTIME 01/11/19  Yes [place], Modena Nunnery, MD  doxazosin (CARDURA) 4 MG tablet TAKE 1 TABLET (4 MG TOTAL) BY MOUTH AT BEDTIME. Patient not taking: Reported on 2019/07/05 01/18/19   Alycia Rossetti, MD    Allergies    Patient has no known allergies.  Review of Systems   Review of Systems  Unable to perform ROS: Dementia (level 5 caveat)    Physical Exam Updated Vital Signs BP 132/76   Pulse 92    Temp (!) 97.5 F (36.4 C) (Oral)   Resp (!) 32   SpO2 93%   Physical Exam Vitals and nursing note reviewed.  Constitutional:      Appearance: He is well-developed.     Comments: Thin, frail  HENT:     Head: Normocephalic and atraumatic.  Eyes:     Conjunctiva/sclera: Conjunctivae normal.  Cardiovascular:     Rate and Rhythm: Normal rate and regular rhythm.     Pulses: Normal pulses.  Pulmonary:     Effort: Pulmonary effort is normal. No respiratory distress.     Comments: 84% on room air, improved to 93% on 6L [place] Abdominal:     Palpations: Abdomen is soft.     Tenderness: There is no abdominal tenderness.  Musculoskeletal:     Cervical back: Neck supple.  Skin:    General: Skin is warm and dry.  Neurological:     Mental Status: He is alert.     ED Results / Procedures / Treatments   Labs (all labs ordered are listed, but only abnormal results are displayed) Labs Reviewed  COMPREHENSIVE METABOLIC PANEL - Abnormal; Notable for the following components:      Result Value   Glucose, Bld 245 (*)    Albumin 3.1 (*)    GFR calc non Af Amer 57 (*)    All other components within normal limits  CBC WITH DIFFERENTIAL/PLATELET - Abnormal; Notable for the following components:   Abs Immature Granulocytes 0.08 (*)    All other components within normal limits  MAGNESIUM  HEMOGLOBIN A1C  COMPREHENSIVE METABOLIC PANEL  CBC  D-DIMER, QUANTITATIVE (NOT AT [facility])  FERRITIN  C-REACTIVE PROTEIN  MAGNESIUM  PHOSPHORUS  ABO/RH    EKG None  Radiology DG Chest 1 View  Result Date: 07-05-2019 CLINICAL DATA:  Shortness of breath, COVID-19 positive EXAM: CHEST  1 VIEW COMPARISON:  06/01/2019 FINDINGS: Heart size is within normal limits. Interval development of extensive airspace opacities throughout the right lung, more consolidative within the right lower lobe. Streaky left basilar airspace opacity. No pleural effusion or pneumothorax. IMPRESSION: Interval development of extensive  airspace opacities throughout the right lung, more consolidative within the right lower lobe. Streaky left basilar airspace opacity. Findings suspicious for multifocal atypical/viral pneumonia. Electronically Signed   By: Davina Poke D.O.   On: 2019-07-05 13:43    Procedures .Critical Care Performed by: Wyvonnia Dusky, MD Authorized by: Wyvonnia Dusky, MD   Critical care provider statement:    Critical care time (minutes):  30   Critical care was necessary to treat or prevent imminent or life-threatening deterioration of the following conditions:  Respiratory failure   Critical care  was time spent personally by me on the following activities:  Discussions with consultants, evaluation of patient's response to treatment, examination of patient, ordering and performing treatments and interventions, ordering and review of laboratory studies, ordering and review of radiographic studies, pulse oximetry, re-evaluation of patient's condition, obtaining history from patient or surrogate and review of old charts Comments:     Covid respiratory failure requiring supplemental oxygen therapy   (including critical care time)  Medications Ordered in ED Medications  insulin aspart (novoLOG) injection 0-9 Units (has no administration in time range)  0.9 %  sodium chloride infusion (has no administration in time range)  acetaminophen (TYLENOL) tablet 650 mg (has no administration in time range)    Or  acetaminophen (TYLENOL) suppository 650 mg (has no administration in time range)  ondansetron (ZOFRAN) tablet 4 mg (has no administration in time range)    Or  ondansetron (ZOFRAN) injection 4 mg (has no administration in time range)  enoxaparin (LOVENOX) injection 40 mg (has no administration in time range)  dexamethasone (DECADRON) injection 6 mg (has no administration in time range)  remdesivir 200 mg in sodium chloride 0.9% 250 mL IVPB (has no administration in time range)    Followed by    remdesivir 100 mg in sodium chloride 0.9 % 100 mL IVPB (has no administration in time range)  ascorbic acid (VITAMIN C) tablet 500 mg (has no administration in time range)  zinc sulfate capsule 220 mg (has no administration in time range)  acetaminophen (TYLENOL) tablet 650 mg (has no administration in time range)  aspirin EC tablet 81 mg (has no administration in time range)  thiamine (B-1) injection 100 mg (has no administration in time range)  Vitamin D3 (Vitamin D) tablet 5,000 Units (has no administration in time range)  donepezil (ARICEPT) tablet 10 mg (has no administration in time range)  finasteride (PROSCAR) tablet 5 mg (has no administration in time range)  insulin glargine (LANTUS) injection 10 Units (has no administration in time range)  simvastatin (ZOCOR) tablet 20 mg (has no administration in time range)  hydrALAZINE (APRESOLINE) injection 5-10 mg (has no administration in time range)  sodium chloride 0.9 % bolus 1,000 mL (0 mLs Intravenous Stopped 06/27/19 1535)    ED Course  I have reviewed the triage vital signs and the nursing notes.  Pertinent labs & imaging results that were available during my care of the patient were reviewed by me and considered in my medical decision making (see chart for details).  85 yo male with covid respiratory failure, hypoxic and requiring 6L [place] supplemental oxygen.  He otherwise does not appear septic, and I do not believe he requires bipap or intubation at this time.  Will need admission given his general state of health  Ethan Davis was evaluated in Emergency Department on 2019-06-27 for the symptoms described in the history of present illness. He was evaluated in the context of the global COVID-19 pandemic, which necessitated consideration that the patient might be at risk for infection with the SARS-CoV-2 virus that causes COVID-19. Institutional protocols and algorithms that pertain to the evaluation of patients at risk for COVID-19  are in a state of rapid change based on information released by regulatory bodies including the CDC and federal and state organizations. These policies and algorithms were followed during the patient's care in the ED.    Clinical Course as of Jun 06 1831  Mon 2019/06/27  1349 IMPRESSION: Interval development of extensive airspace opacities throughout  the right lung, more consolidative within the right lower lobe. Streaky left basilar airspace opacity. Findings suspicious for multifocal atypical/viral pneumonia.   [MT]    Clinical Course User Index [MT] Helaina Stefano, Kermit Balo, MD   M Final Clinical Impression(s) / ED Diagnoses Final diagnoses:  COVID-19  Hypoxia    Rx / DC Orders ED Discharge Orders    None       Renaye Rakers Kermit Balo, MD 07/07/2019 [phone]

## 2019-06-06 NOTE — ED Triage Notes (Signed)
Daughter reports pt diagnosed with covid last Wednesday.  Reports not eating,  Very weak, nauseated and dizziness.  Family has been giving him pedialyte and ensure.  Reports o2 sats as low as 83%

## 2019-06-06 NOTE — ED Notes (Signed)
Attempted report x1. 

## 2019-06-06 NOTE — ED Notes (Signed)
[facility] spoke with Ethan Davis patient placed on list to be transported to [place].

## 2019-06-06 NOTE — H&P (Signed)
History and Physical    Ethan Davis FGH:[id number] DOB: 12-30-1926 DOA: 05/19/2019  PCP: Alycia Rossetti, MD Patient coming from: Home  Chief Complaint: Low Oxygen from COVID  Level V Caveat. Pt pleasently demented and  Unable to provide reliable history. History obtained from pt chart, EDP and EDRN report. Attempted to reach family but no answer.   HPI: Ethan Davis is a 85 y.o. male with medical history significant of Advanced Dementia, HLD, HTN, DM, cararacts, BPH. Pt presenting w/ known COVID Dx from outpt testing performed on 05/31/19 after pt presented w/ hypotension and dizziness, ppossible sinusitis and UTI. Started on Levaquin at that time.  On 06/01/19 pt presented to ED w/ fever and increasing AMS. Found to be COVID + at that time. Prior COVID test was still pending. Pt was given several liters of IVF and sent home w/ family at that time. No evidence of respiratory decline at that time. Pt was contacted on 06/02/19 for treatement of COVID w/ bamlanivimab infusion at [facility] but no call back from family was reported.   Per reports pt has had very little to eat since the time of his dx due to nausea and vomiting. Family has been giving hime Pedialyte and reported an O2 saturation this am of 83%.    ED Course: O2 94% on 6L [place].   Review of Systems: As per HPI otherwise all other systems reviewed and are negative  Ambulatory Status:Requires 24hr supervision. Limited mobility.   Past Medical History:  Diagnosis Date  . BPH (benign prostatic hyperplasia)   . Cataract   . Diabetes mellitus   . Hypercholesteremia   . Hypertension     Past Surgical History:  Procedure Laterality Date  . CATARACT EXTRACTION W/PHACO  03/13/2011   Procedure: CATARACT EXTRACTION PHACO AND INTRAOCULAR LENS PLACEMENT (IOC);  Surgeon: Tonny Branch;  Location: [facility];  Service: Ophthalmology;  Laterality: Right;  CDE: 14.58  . EYE SURGERY     left cataract extraction  . FRACTURE SURGERY Right 1980   pin  in right knee  . ORIF KNEE DISLOCATION  as child   right     Social History   Socioeconomic History  . Marital status: Widowed    Spouse name: Not on file  . Number of children: Not on file  . Years of education: Not on file  . Highest education level: Not on file  Occupational History  . Not on file  Tobacco Use  . Smoking status: Former Smoker    Packs/day: 0.25    Years: 20.00    Pack years: 5.00    Types: Cigarettes    Quit date: 03/04/1964    Years since quitting: 55.2  . Smokeless tobacco: Never Used  Substance and Sexual Activity  . Alcohol use: Yes    Comment: very little  . Drug use: No  . Sexual activity: Yes  Other Topics Concern  . Not on file  Social History Narrative  . Not on file   Social Determinants of Health   Financial Resource Strain:   . Difficulty of Paying Living Expenses: Not on file  Food Insecurity:   . Worried About Charity fundraiser in the Last Year: Not on file  . Ran Out of Food in the Last Year: Not on file  Transportation Needs:   . Lack of Transportation (Medical): Not on file  . Lack of Transportation (Non-Medical): Not on file  Physical Activity:   . Days of Exercise per  Week: Not on file  . Minutes of Exercise per Session: Not on file  Stress:   . Feeling of Stress : Not on file  Social Connections:   . Frequency of Communication with Friends and Family: Not on file  . Frequency of Social Gatherings with Friends and Family: Not on file  . Attends Religious Services: Not on file  . Active Member of Clubs or Organizations: Not on file  . Attends Banker Meetings: Not on file  . Marital Status: Not on file  Intimate Partner Violence:   . Fear of Current or Ex-Partner: Not on file  . Emotionally Abused: Not on file  . Physically Abused: Not on file  . Sexually Abused: Not on file    No Known Allergies  Family History  Problem Relation Age of Onset  . Anesthesia problems Neg Hx   . Hypotension Neg Hx     . Malignant hyperthermia Neg Hx   . Pseudochol deficiency Neg Hx       Prior to Admission medications   Medication Sig Start Date End Date Taking? Authorizing Provider  Accu-Chek Softclix Lancets lancets Use as instructed 05/25/19  Yes [place], Velna Hatchet, MD  Acetaminophen 650 MG TABS Take 1 tablet (650 mg total) by mouth 3 (three) times daily as needed. 11/14/12  Yes Moreno-Coll, Adlih, MD  donepezil (ARICEPT) 10 MG tablet TAKE 1 TABLET AT BEDTIME 01/18/19  Yes [place], Velna Hatchet, MD  finasteride (PROSCAR) 5 MG tablet TAKE 1 TABLET EVERY DAY 01/18/19  Yes [place], Velna Hatchet, MD  glipiZIDE (GLUCOTROL) 5 MG tablet Take 5mg  PO Q AM and 2.5mg  PO Q PM. 05/27/19  Yes [place], 05/29/19 F, MD  glucose blood (ACCU-CHEK GUIDE) test strip USE AS DIRECTED TO MONITOR BLOOD SUGAR 3X DAILY. DX: E11.9. 05/25/19  Yes [place], 05/27/19, MD  Insulin Glargine (LANTUS SOLOSTAR) 100 UNIT/ML Solostar Pen Inject 20 Units into the skin at bedtime. 02/03/19  Yes [place], 02/05/19, MD  Insulin Pen Needle (BD PEN NEEDLE NANO U/F) 32G X 4 MM MISC USE AS DIRECTED TO INJECT LANTUS DAILY 05/25/19  Yes [place], 05/27/19, MD  simvastatin (ZOCOR) 20 MG tablet TAKE 1 TABLET AT BEDTIME 01/11/19  Yes , 03/13/19, MD  doxazosin (CARDURA) 4 MG tablet TAKE 1 TABLET (4 MG TOTAL) BY MOUTH AT BEDTIME. Patient not taking: Reported on 06/08/2019 01/18/19   03/20/19, MD    Physical Exam: Vitals:   06/08/2019 1200 05/17/2019 1330 05/11/2019 1430 05/28/2019 1500  BP: (!) 150/79 124/74 137/76   Pulse: 86 99 63 91  Resp: (!) 26 (!) 23 (!) 30 (!) 32  SpO2: 94% 93% 95% 93%     General: Frail and ill appearing. Cachectic  Eyes:  PERRL, EOMI, normal lids, iris ENT: Dry MM. Normal hearing.  Neck:  no LAD, masses or thyromegaly Cardiovascular:  RRR, II/VI systolic murmur. No LE edema.  Respiratory: Mildly labored. Decreased breath sounds on R. No wheezing or ronchi. Abdomen:  soft, ntnd, NABS Skin:  no rash or induration seen on limited  exam Musculoskeletal:  grossly normal tone BUE/BLE, good ROM, no bony abnormality Psychiatric: AOx0. Responds to simple commands.  Neurologic:  CN 2-12 grossly intact, moves all extremities in coordinated fashion, sensation intact  Labs on Admission: I have personally reviewed following labs and imaging studies  CBC: Recent Labs  Lab 06/01/19 1102 06/05/2019 1347  WBC 5.8 7.0  NEUTROABS 4.2 5.5  HGB 14.0 15.2  HCT 42.0 48.6  MCV 90.3 94.0  PLT 98* 186   Basic Metabolic Panel: Recent Labs  Lab 06/01/19 1102 06-20-19 1347  NA 132* 141  K 4.5 4.3  CL 97* 101  CO2 21* 27  GLUCOSE 365* 245*  BUN 20 21  CREATININE 1.34* 1.13  CALCIUM 8.5* 8.9  MG  --  2.4   GFR: Estimated Creatinine Clearance: 46.3 mL/min (by C-G formula based on SCr of 1.13 mg/dL). Liver Function Tests: Recent Labs  Lab 06/01/19 1102 June 20, 2019 1347  AST 31 35  ALT 17 20  ALKPHOS 92 92  BILITOT 0.6 1.1  PROT 7.3 7.7  ALBUMIN 3.6 3.1*   No results for input(s): LIPASE, AMYLASE in the last 168 hours. No results for input(s): AMMONIA in the last 168 hours. Coagulation Profile: No results for input(s): INR, PROTIME in the last 168 hours. Cardiac Enzymes: No results for input(s): CKTOTAL, CKMB, CKMBINDEX, TROPONINI in the last 168 hours. BNP (last 3 results) No results for input(s): PROBNP in the last 8760 hours. HbA1C: No results for input(s): HGBA1C in the last 72 hours. CBG: No results for input(s): GLUCAP in the last 168 hours. Lipid Profile: No results for input(s): CHOL, HDL, LDLCALC, TRIG, CHOLHDL, LDLDIRECT in the last 72 hours. Thyroid Function Tests: No results for input(s): TSH, T4TOTAL, FREET4, T3FREE, THYROIDAB in the last 72 hours. Anemia Panel: No results for input(s): VITAMINB12, FOLATE, FERRITIN, TIBC, IRON, RETICCTPCT in the last 72 hours. Urine analysis:    Component Value Date/Time   COLORURINE YELLOW (A) 06/01/2019 1412   APPEARANCEUR CLOUDY (A) 06/01/2019 1412   LABSPEC  1.030 06/01/2019 1412   PHURINE 5.0 06/01/2019 1412   GLUCOSEU >=500 (A) 06/01/2019 1412   HGBUR SMALL (A) 06/01/2019 1412   BILIRUBINUR NEGATIVE 06/01/2019 1412   KETONESUR 5 (A) 06/01/2019 1412   PROTEINUR 100 (A) 06/01/2019 1412   NITRITE NEGATIVE 06/01/2019 1412   LEUKOCYTESUR NEGATIVE 06/01/2019 1412    Creatinine Clearance: Estimated Creatinine Clearance: 46.3 mL/min (by C-G formula based on SCr of 1.13 mg/dL).  Sepsis Labs: (procalcitonin:4,lacticidven:4) ) Recent Results (from the past 240 hour(s))  SARS-COV-2 RNA,(COVID-19) QUAL NAAT     Status: Abnormal   Collection Time: 05/31/19 11:19 AM   Specimen: Respiratory  Result Value Ref Range Status   SARS CoV2 RNA DETECTED (A) NOT DETECT Final    Comment: . A Detected result is considered a positive test result for COVID-19.  This indicates that RNA from SARS-CoV-2 (formerly 2019-nCoV) was detected, and the patient is infected with the virus and presumed to be contagious. If requested by public health authority, specimen will be sent for additional testing. . Please review the "Fact Sheets" and FDA authorized labeling available for health care providers and patients using the following websites: https://www.[facility].com/home/Covid-19/HCP/[facility]/fact- sheet.html https://www.[facility].com/home/Covid-19/Patients/ [facility]/fact-sheet.html . This test has been authorized by the FDA under an  Emergency Use Authorization (EUA) for use by authorized laboratories. . Due to the current public health emergency, [facility] is receiving a high volume of samples from a wide variety of swabs and media for COVID-19 testing. In order to serve patients during this public health crisis, samples from appropri ate clinical sources are  being tested. Negative test results derived from specimens received in non-commercially manufactured viral collection and transport media, or in media and sample  collection kits not yet authorized by FDA for COVID-19 testing should be cautiously evaluated and the patient potentially subjected to extra precautions such as additional clinical monitoring, including collection of an additional specimen. Marland Kitchen  Methodology:  Nucleic Acid Amplification Test (NAAT) includes RT-PCR or TMA . Additional information about COVID-19 can be found at the [facility] website: www.[facility].com/Covid19.   Respiratory Panel by RT PCR (Flu A&B, Covid) - Nasopharyngeal Swab     Status: Abnormal   Collection Time: 06/01/19  1:53 PM   Specimen: Nasopharyngeal Swab  Result Value Ref Range Status   SARS Coronavirus 2 by RT PCR POSITIVE (A) NEGATIVE Final    Comment: CRITICAL RESULT CALLED TO, READ BACK BY AND VERIFIED WITH: JENNIFER WHITLEY 06/01/19 1454 KLW (NOTE) SARS-CoV-2 target nucleic acids are DETECTED. SARS-CoV-2 RNA is generally detectable in upper respiratory specimens  during the acute phase of infection. Positive results are indicative of the presence of the identified virus, but do not rule out bacterial infection or co-infection with other pathogens not detected by the test. Clinical correlation with patient history and other diagnostic information is necessary to determine patient infection status. The expected result is Negative. Fact Sheet for Patients:  https://www.moore.com/https://www.fda.gov/media/142436/download Fact Sheet for Healthcare Providers: https://www.young.biz/https://www.fda.gov/media/142435/download This test is not yet approved or cleared by the [place] FDA and  has been authorized for detection and/or diagnosis of SARS-CoV-2 by FDA under an Emergency Use Authorization (EUA).  This EUA will remain in effect (meaning this test can be  used) for the duration of  the COVID-19 declaration under Section 564(b)(1) of the Act, 21 U.S.C. section 360bbb-3(b)(1), unless the authorization is terminated or revoked sooner.    Influenza A by PCR NEGATIVE NEGATIVE  Final   Influenza B by PCR NEGATIVE NEGATIVE Final    Comment: (NOTE) The Xpert Xpress SARS-CoV-2/FLU/RSV assay is intended as an aid in  the diagnosis of influenza from Nasopharyngeal swab specimens and  should not be used as a sole basis for treatment. Nasal washings and  aspirates are unacceptable for Xpert Xpress SARS-CoV-2/FLU/RSV  testing. Fact Sheet for Patients: https://www.moore.com/https://www.fda.gov/media/142436/download Fact Sheet for Healthcare Providers: https://www.young.biz/https://www.fda.gov/media/142435/download This test is not yet approved or cleared by the [place] FDA and  has been authorized for detection and/or diagnosis of SARS-CoV-2 by  FDA under an Emergency Use Authorization (EUA). This EUA will remain  in effect (meaning this test can be used) for the duration of the  Covid-19 declaration under Section 564(b)(1) of the Act, 21  U.S.C. section 360bbb-3(b)(1), unless the authorization is  terminated or revoked. Performed at [facility], [address] [place], [place] [address]   Urine culture     Status: None   Collection Time: 06/01/19  2:10 PM   Specimen: Urine, Random  Result Value Ref Range Status   Specimen Description   Final    URINE, RANDOM Performed at [facility], [address] [place], [place] [address]    Special Requests   Final    NONE Performed at [facility], [address] [place], [place] [address]    Culture   Final    NO GROWTH Performed at [facility], [address] [place], [place] [address]    Report Status 06/03/2019 FINAL  Final     Radiological Exams on Admission: DG Chest 1 View  Result Date: 2018-08-19 CLINICAL DATA:  Shortness of breath, COVID-19 positive EXAM: CHEST  1 VIEW COMPARISON:  06/01/2019 FINDINGS: Heart size is within normal limits. Interval development of extensive airspace opacities throughout the right lung, more consolidative within the right lower lobe. Streaky left basilar airspace opacity.  No pleural effusion or pneumothorax. IMPRESSION: Interval development of extensive airspace  opacities throughout the right lung, more consolidative within the right lower lobe. Streaky left basilar airspace opacity. Findings suspicious for multifocal atypical/viral pneumonia. Electronically Signed   By: Duanne Guess D.O.   On: 05/15/2019 13:43    EKG: Independently reviewed. No ACS. Sinus. Non-specific T-wave changes.   Assessment/Plan Active Problems:   Type 2 diabetes with nephropathy (HCC)   Essential hypertension, benign   Hyperlipidemia   Protein-calorie malnutrition (HCC)   Acute respiratory failure (HCC)   COVID-19 virus infection   Dementia without behavioral disturbance [facility])   85yo Male w/ a history of advanced dementia, failure to thrive, HTN, HLD, DM presenting w/ acute respiratory failure secondary to COVID-19.    Acute respiratory failure / COVID: On day ~6 of illness. New O2 requirement to 6L [place]. Ct showing typical changes associated w/ COVID-19.  - Admit and transfer to [facility] when bed available.  - COVID orderset initiated.   DM:  Last A1c 14.5. Glucose 245 on admission - SSI - A1c - DM coordinator  Dementia: at baseline. Pleasently confused. Lives w/ family as caretakers - Continue Aricept  HTN: On cardura. Per report pt told to stop this prior to admission due to potential hypotension. Nml BP on admissoin - Hydralazine PRN - Restart Cardura if remains elevated.   HLD: - continue statin   DVT prophylaxis: Lovenox  Code Status: Full - Multiple attempts made to speak to family w/o success.   Family Communication: as above   Disposition Plan: pending improvement   Consults called: none  Admission status: inpt - [facility]    Ozella Rocks MD [facility]  If 7PM-7AM, please contact night-coverage [email or web address] Password [facility]  05/20/2019, 5:58 PM

## 2019-06-07 ENCOUNTER — Inpatient Hospital Stay (HOSPITAL_COMMUNITY): Payer: Medicare Other

## 2019-06-07 ENCOUNTER — Other Ambulatory Visit: Payer: Self-pay

## 2019-06-07 DIAGNOSIS — J1289 Other viral pneumonia: Secondary | ICD-10-CM

## 2019-06-07 DIAGNOSIS — E7800 Pure hypercholesterolemia, unspecified: Secondary | ICD-10-CM

## 2019-06-07 DIAGNOSIS — E785 Hyperlipidemia, unspecified: Secondary | ICD-10-CM | POA: Diagnosis present

## 2019-06-07 DIAGNOSIS — J1282 Pneumonia due to coronavirus disease 2019: Secondary | ICD-10-CM | POA: Diagnosis present

## 2019-06-07 DIAGNOSIS — E1165 Type 2 diabetes mellitus with hyperglycemia: Secondary | ICD-10-CM | POA: Diagnosis present

## 2019-06-07 DIAGNOSIS — E440 Moderate protein-calorie malnutrition: Secondary | ICD-10-CM

## 2019-06-07 DIAGNOSIS — E118 Type 2 diabetes mellitus with unspecified complications: Secondary | ICD-10-CM

## 2019-06-07 DIAGNOSIS — I10 Essential (primary) hypertension: Secondary | ICD-10-CM | POA: Diagnosis present

## 2019-06-07 DIAGNOSIS — E1140 Type 2 diabetes mellitus with diabetic neuropathy, unspecified: Secondary | ICD-10-CM | POA: Diagnosis present

## 2019-06-07 DIAGNOSIS — IMO0002 Reserved for concepts with insufficient information to code with codable children: Secondary | ICD-10-CM | POA: Diagnosis present

## 2019-06-07 DIAGNOSIS — R7989 Other specified abnormal findings of blood chemistry: Secondary | ICD-10-CM | POA: Diagnosis present

## 2019-06-07 DIAGNOSIS — E0842 Diabetes mellitus due to underlying condition with diabetic polyneuropathy: Secondary | ICD-10-CM

## 2019-06-07 DIAGNOSIS — J9601 Acute respiratory failure with hypoxia: Secondary | ICD-10-CM | POA: Diagnosis present

## 2019-06-07 LAB — COMPREHENSIVE METABOLIC PANEL
ALT: 19 U/L (ref 0–44)
AST: 31 U/L (ref 15–41)
Albumin: 3.1 g/dL — ABNORMAL LOW (ref 3.5–5.0)
Alkaline Phosphatase: 89 U/L (ref 38–126)
Anion gap: 13 (ref 5–15)
BUN: 18 mg/dL (ref 8–23)
CO2: 23 mmol/L (ref 22–32)
Calcium: 8.5 mg/dL — ABNORMAL LOW (ref 8.9–10.3)
Chloride: 106 mmol/L (ref 98–111)
Creatinine, Ser: 0.89 mg/dL (ref 0.61–1.24)
GFR calc Af Amer: >60 mL/min (ref >60)
GFR calc non Af Amer: >60 mL/min (ref >60)
Glucose, Bld: 248 mg/dL — ABNORMAL HIGH (ref 70–99)
Potassium: 4.6 mmol/L (ref 3.5–5.1)
Sodium: 142 mmol/L (ref 135–145)
Total Bilirubin: 1 mg/dL (ref 0.3–1.2)
Total Protein: 7.2 g/dL (ref 6.5–8.1)

## 2019-06-07 LAB — GLUCOSE, CAPILLARY
Glucose-Capillary: 186 mg/dL — ABNORMAL HIGH (ref 70–99)
Glucose-Capillary: 191 mg/dL — ABNORMAL HIGH (ref 70–99)
Glucose-Capillary: 198 mg/dL — ABNORMAL HIGH (ref 70–99)
Glucose-Capillary: 262 mg/dL — ABNORMAL HIGH (ref 70–99)
Glucose-Capillary: 265 mg/dL — ABNORMAL HIGH (ref 70–99)

## 2019-06-07 LAB — HEMOGLOBIN A1C
Hgb A1c MFr Bld: 12.4 % — ABNORMAL HIGH (ref 4.8–5.6)
Mean Plasma Glucose: 309.18 mg/dL

## 2019-06-07 LAB — CBC
HCT: 45.8 % (ref 39.0–52.0)
Hemoglobin: 14.7 g/dL (ref 13.0–17.0)
MCH: 29.6 pg (ref 26.0–34.0)
MCHC: 32.1 g/dL (ref 30.0–36.0)
MCV: 92.2 fL (ref 80.0–100.0)
Platelets: 185 10*3/uL (ref 150–400)
RBC: 4.97 MIL/uL (ref 4.22–5.81)
RDW: 13 % (ref 11.5–15.5)
WBC: 6.3 10*3/uL (ref 4.0–10.5)
nRBC: 0 % (ref 0.0–0.2)

## 2019-06-07 LAB — D-DIMER, QUANTITATIVE: D-Dimer, Quant: >20 ug/mL-FEU — ABNORMAL HIGH (ref 0.00–0.50)

## 2019-06-07 LAB — MAGNESIUM: Magnesium: 2.4 mg/dL (ref 1.7–2.4)

## 2019-06-07 LAB — LIPID PANEL
Cholesterol: 146 mg/dL (ref 0–200)
HDL: 58 mg/dL (ref >40)
LDL Cholesterol: 69 mg/dL (ref 0–99)
Total CHOL/HDL Ratio: 2.5 RATIO
Triglycerides: 95 mg/dL (ref <150)
VLDL: 19 mg/dL (ref 0–40)

## 2019-06-07 LAB — ABO/RH: ABO/RH(D): A POS

## 2019-06-07 LAB — PHOSPHORUS: Phosphorus: 3 mg/dL (ref 2.5–4.6)

## 2019-06-07 LAB — C-REACTIVE PROTEIN: CRP: 17.3 mg/dL — ABNORMAL HIGH (ref <1.0)

## 2019-06-07 LAB — FERRITIN: Ferritin: 509 ng/mL — ABNORMAL HIGH (ref 24–336)

## 2019-06-07 LAB — PROCALCITONIN: Procalcitonin: <0.1 ng/mL

## 2019-06-07 MED ORDER — QUETIAPINE FUMARATE 25 MG PO TABS
25.0000 mg | ORAL_TABLET | Freq: Once | ORAL | Status: AC
  Administered 2019-06-08: 25 mg via ORAL
  Filled 2019-06-07: qty 1

## 2019-06-07 MED ORDER — JEVITY 1.2 CAL PO LIQD: 1000.0000 mL | ORAL | Status: DC

## 2019-06-07 MED ORDER — OSMOLITE 1.2 CAL PO LIQD
1000.0000 mL | ORAL | Status: DC
  Administered 2019-06-08 – 2019-06-11 (×3): 1000 mL
  Filled 2019-06-07 (×10): qty 1000

## 2019-06-07 MED ORDER — IOHEXOL 350 MG/ML SOLN
80.0000 mL | Freq: Once | INTRAVENOUS | Status: AC | PRN
  Administered 2019-06-07: 80 mL via INTRAVENOUS

## 2019-06-07 MED ORDER — SIMVASTATIN 40 MG PO TABS
40.0000 mg | ORAL_TABLET | Freq: Every day | ORAL | Status: DC
  Administered 2019-06-07 – 2019-06-12 (×6): 40 mg via ORAL
  Filled 2019-06-07 (×7): qty 1

## 2019-06-07 MED ORDER — QUETIAPINE FUMARATE 25 MG PO TABS
50.0000 mg | ORAL_TABLET | Freq: Two times a day (BID) | ORAL | Status: DC
  Administered 2019-06-08 – 2019-06-11 (×7): 50 mg via ORAL
  Filled 2019-06-07 (×7): qty 2

## 2019-06-07 MED ORDER — SODIUM CHLORIDE 0.9 % IV SOLN
100.0000 mg | Freq: Every day | INTRAVENOUS | Status: AC
  Administered 2019-06-07 – 2019-06-10 (×4): 100 mg via INTRAVENOUS
  Filled 2019-06-07 (×4): qty 20

## 2019-06-07 MED ORDER — SODIUM CHLORIDE 0.9 % IV SOLN: 100.0000 mg | Freq: Every day | INTRAVENOUS | Status: DC

## 2019-06-07 MED ORDER — ENOXAPARIN SODIUM 80 MG/0.8ML ~~LOC~~ SOLN
1.0000 mg/kg | Freq: Two times a day (BID) | SUBCUTANEOUS | Status: DC
  Administered 2019-06-07 – 2019-06-13 (×11): 80 mg via SUBCUTANEOUS
  Filled 2019-06-07 (×11): qty 0.8

## 2019-06-07 MED ORDER — HALOPERIDOL LACTATE 5 MG/ML IJ SOLN
2.0000 mg | Freq: Four times a day (QID) | INTRAMUSCULAR | Status: DC | PRN
  Administered 2019-06-07 – 2019-06-10 (×5): 2 mg via INTRAVENOUS
  Filled 2019-06-07 (×5): qty 1

## 2019-06-07 MED ORDER — SODIUM CHLORIDE 0.9 % IV SOLN
200.0000 mg | Freq: Once | INTRAVENOUS | Status: AC
  Administered 2019-06-07: 200 mg via INTRAVENOUS
  Filled 2019-06-07: qty 40

## 2019-06-07 NOTE — Procedures (Signed)
Cortrak  Person Inserting Tube:  Maylon Peppers C, RD Tube Type:  Cortrak - 43 inches Tube Location:  Left nare Initial Placement:  Stomach Secured by: Bridle Technique Used to Measure Tube Placement:  Documented cm marking at nare/ corner of mouth Cortrak Secured At:  62 cm    Cortrak Tube Team Note:  Consult received to place a Cortrak feeding tube.  Pt moved constantly during placement. Will get xray for confirmation.   X-ray is required, abdominal x-ray has been ordered by the Cortrak team. Please confirm tube placement before using the Cortrak tube.   If the tube becomes dislodged please keep the tube and contact the Cortrak team at [email or web address] (password [email or web address]) for replacement.  If after hours and replacement cannot be delayed, place a NG tube and confirm placement with an abdominal x-ray.   [place], [place], [place] Pager [phone] After Hours Pager

## 2019-06-07 NOTE — Progress Notes (Signed)
Initial Nutrition Assessment   RD working remotely.  DOCUMENTATION CODES:   Not applicable  INTERVENTION:  Once feeding tube is placed and ready for use, Initiate Osmolite 1.2 fomrula @ 25 ml/hr and increase by 10 ml every 4 hours to goal rate of 65 ml/hr.   Tube feeding regimen provides 1872 kcal (100% of needs), 87 grams of protein, and 1279 ml of H2O.   NUTRITION DIAGNOSIS:   Inadequate oral intake related to inability to eat as evidenced by NPO status.  GOAL:   Patient will meet greater than or equal to 90% of their needs  MONITOR:   TF tolerance, Weight trends, Labs, I & O's  REASON FOR ASSESSMENT:   Consult Enteral/tube feeding initiation and management  ASSESSMENT:   85yo Male w/ a history of advanced dementia, failure to thrive, HTN, HLD, DM presenting w/ acute respiratory failure secondary to COVID-19.Per MD note, pt with poor po intake and n/v since COVID dx 12/23.    Pt is currently NPO. Plan for Cortrak NG to be placed for enteral nutrition today. RD given verbal consent from MD to manage and initiate tube feedings. Pt with a 3.7% weight loss in 6 days per weight records. RD to continue to monitor for tolerance.   Unable to complete Nutrition-Focused physical exam at this time.   Labs and medications reviewed.   Diet Order:   Diet Order            Diet NPO time specified  Diet effective now              EDUCATION NEEDS:   Not appropriate for education at this time  Skin:  Skin Assessment: Reviewed RN Assessment  Last BM:  Unknown  Height:   Ht Readings from Last 1 Encounters:  06/07/19 5\' 11"  (1.803 m)    Weight:   Wt Readings from Last 1 Encounters:  06/07/19 77.7 kg    Ideal Body Weight:  78.18 kg  BMI:  Body mass index is 23.89 kg/m.  Estimated Nutritional Needs:   Kcal:  1800-1950  Protein:  80-95 grams  Fluid:  >/= 1.8 L/day    Corrin Parker, MS, RD, LDN Pager # [phone] After hours/ weekend pager #  [phone]

## 2019-06-07 NOTE — Progress Notes (Signed)
Spoke with pt daughter Iris. Updated on pt condition including the initiation of wrist restraints. All questions answered to the best of this RNs ability.

## 2019-06-07 NOTE — Progress Notes (Signed)
PROGRESS NOTE    Ethan Davis  JXB:[id number] DOB: 1926-09-27 DOA: 05/22/2019 PCP: Alycia Rossetti, MD   Brief Narrative:  85 y.o. BM PMHx  Advanced Dementia, HLD, HTN, diabetes type 2 uncontrolled with complication, cararacts, BPH.   Presenting w/ known COVID Dx from outpt testing performed on 05/31/19 after pt presented w/ hypotension and dizziness, ppossible sinusitis and UTI. Started on Levaquin at that time.  On 06/01/19 pt presented to ED w/ fever and increasing AMS. Found to be COVID + at that time. Prior COVID test was still pending. Pt was given several liters of IVF and sent home w/ family at that time. No evidence of respiratory decline at that time. Pt was contacted on 06/02/19 for treatement of COVID w/ bamlanivimab infusion at [facility] but no call back from family was reported.   Per reports pt has had very little to eat since the time of his dx due to nausea and vomiting. Family has been giving hime Pedialyte and reported an O2 saturation this am of 83%.    Subjective: Unresponsive except for the strongest stimuli then only vaguely withdraws from pain.     Assessment & Plan:   Active Problems:   Type 2 diabetes with nephropathy (HCC)   Essential hypertension, benign   Hyperlipidemia   Protein-calorie malnutrition (HCC)   Acute respiratory failure (HCC)   COVID-19 virus infection   Dementia without behavioral disturbance (HCC)   Pneumonia due to COVID-19 virus   Acute respiratory failure with hypoxia (HCC)   Elevated d-dimer   Essential hypertension   HLD (hyperlipidemia)   Diabetes mellitus type 2, uncontrolled, with complications ([place])   Diabetic neuropathy ([place])   Covid pneumonia/acute respiratory failure with hypoxia COVID-19 Labs  Recent Labs    06/07/19 0315  DDIMER >20.00*  FERRITIN 509*  CRP 17.3*    Lab Results  Component Value Date   SARSCOV2NAA POSITIVE (A) 05/24/2019   SARSCOV2NAA POSITIVE (A) 06/01/2019   SARSCOV2NAA DETECTED (A)  05/31/2019  -Decadron 6 mg daily -Remdesivir per pharmacy protocol -12/29 spoke with Iris (daughter) counseled her that the use of Actemra is off label and lacks randomized control trial data for this medication.  Counseled that improvement in patient's has been anecdotal in nature.  She agreed to allow [place] to use Actemra. -12/29 procalcitonin pending if not elevated will initiate Actemra.  Elevated D-dimer -CTA PE protocol pending -Lower extremity Doppler pending -Start on treatment dose Lovenox per pharmacy until CTA and Dopplers complete  Essential HTN -Hydralazine PRN  HLD -12/16 LDL= 79 -12/29 increase Zocor 40 mg daily  Diabetes type 2 uncontrolled with complication/diabetic neuropathy -12/16 hemoglobin A1c= 12.5 -Lantus 10 units daily -Sensitive SSI  Protein calorie malnutrition  -12/29 CorTrak will be placed today. -Tube feeds per nutrition recommendations  Altered mental status -Per Iris (daughter) patient normally A/O x4, able to perform ADLs.   DVT prophylaxis: Lovenox treatment dose Code Status: Full Family Communication: 12/29 spoke with Iris (daughter) explained plan of care answered all questions Disposition Plan: TBD   Consultants:    Procedures/Significant Events:     I have personally reviewed and interpreted all radiology studies and my findings are as above.  VENTILATOR SETTINGS: Nasal cannula 12/29 Flow; 5 L/min SPO2; 97%    Cultures 12/28 SARS coronavirus positive 12/28 influenza A/B negative    Antimicrobials: Anti-infectives (From admission, onward)   Start     Dose/Rate Stop   06/07/19 1600  remdesivir 100 mg in sodium chloride 0.9 % 100  mL IVPB  Status:  Discontinued     100 mg 200 mL/hr over 30 Minutes 06/07/19 0017   06/07/19 1600  remdesivir 100 mg in sodium chloride 0.9 % 100 mL IVPB     100 mg 200 mL/hr over 30 Minutes 06/11/19 0959   06/07/19 1000  remdesivir 100 mg in sodium chloride 0.9 % 100 mL IVPB  Status:   Discontinued     100 mg 200 mL/hr over 30 Minutes 06/07/19 0015   06/07/19 0030  remdesivir 200 mg in sodium chloride 0.9% 250 mL IVPB     200 mg 580 mL/hr over 30 Minutes 06/07/19 0214   05/11/2019 1800  remdesivir 200 mg in sodium chloride 0.9% 250 mL IVPB  Status:  Discontinued     200 mg 580 mL/hr over 30 Minutes 06/07/19 0017       Devices   LINES / TUBES:      Continuous Infusions: . feeding supplement (OSMOLITE 1.2 CAL)    . remdesivir 100 mg in NS 100 mL 100 mg (06/07/19 1529)     Objective: Vitals:   06/07/19 0004 06/07/19 0439 06/07/19 0800 06/07/19 1600  BP: (!) 149/79 (!) 142/74 (!) 142/74   Pulse:  85 95   Resp: 20 20 20    Temp: 98.6 F (37 C) 98.4 F (36.9 C) 97.8 F (36.6 C) (!) 97.4 F (36.3 C)  TempSrc: Axillary Axillary Axillary Axillary  SpO2:  95% 97%   Weight: 77.7 kg     Height: 5\' 11"  (1.803 m)       Intake/Output Summary (Last 24 hours) at 06/07/2019 1653 Last data filed at 06/07/2019 0600 Gross per 24 hour  Intake 384.4 ml  Output 500 ml  Net -115.6 ml   Filed Weights   06/07/19 0004  Weight: 77.7 kg    Examination:  General: Unresponsive to painful stimuli, positive acute respiratory distress, cachectic Eyes: negative scleral hemorrhage, negative anisocoria, negative icterus ENT: Negative Runny nose, negative gingival bleeding, Neck:  Negative scars, masses, torticollis, lymphadenopathy, JVD Lungs: Clear to auscultation bilaterally without wheezes or crackles Cardiovascular: Regular rate and rhythm without murmur gallop or rub normal S1 and S2 Abdomen: negative abdominal pain, nondistended, positive soft, bowel sounds, no rebound, no ascites, no appreciable mass Extremities: No significant cyanosis, clubbing, or edema bilateral lower extremities Skin: Negative rashes, lesions, ulcers Psychiatric: Unable to evaluate secondary to altered mental status Central nervous system: Unable to evaluate secondary to altered mental  status .     Data Reviewed: Care during the described time interval was provided by me .  I have reviewed this patient's available data, including medical history, events of note, physical examination, and all test results as part of my evaluation.   CBC: Recent Labs  Lab 06/01/19 1102 05/17/2019 1347 06/07/19 0315  WBC 5.8 7.0 6.3  NEUTROABS 4.2 5.5  --   HGB 14.0 15.2 14.7  HCT 42.0 48.6 45.8  MCV 90.3 94.0 92.2  PLT 98* 186 185   Basic Metabolic Panel: Recent Labs  Lab 06/01/19 1102 06/07/2019 1347 06/07/19 0315  NA 132* 141 142  K 4.5 4.3 4.6  CL 97* 101 106  CO2 21* 27 23  GLUCOSE 365* 245* 248*  BUN 20 21 18   CREATININE 1.34* 1.13 0.89  CALCIUM 8.5* 8.9 8.5*  MG  --  2.4 2.4  PHOS  --   --  3.0   GFR: Estimated Creatinine Clearance: 58.8 mL/min (by C-G formula based on SCr of 0.89 mg/dL).  Liver Function Tests: Recent Labs  Lab 06/01/19 1102 06/04/2019 1347 06/07/19 0315  AST 31 35 31  ALT ALKPHOS 92 92 89  BILITOT 0.6 1.1 1.0  PROT 7.3 7.7 7.2  ALBUMIN 3.6 3.1* 3.1*   No results for input(s): LIPASE, AMYLASE in the last 168 hours. No results for input(s): AMMONIA in the last 168 hours. Coagulation Profile: No results for input(s): INR, PROTIME in the last 168 hours. Cardiac Enzymes: No results for input(s): CKTOTAL, CKMB, CKMBINDEX, TROPONINI in the last 168 hours. BNP (last 3 results) No results for input(s): PROBNP in the last 8760 hours. HbA1C: No results for input(s): HGBA1C in the last 72 hours. CBG: Recent Labs  Lab 05/14/2019 2334 06/07/19 0740 06/07/19 1147 06/07/19 1625  GLUCAP 198* 265* 262* 186*   Lipid Profile: Recent Labs    06/07/19 0315  CHOL 146  HDL 58  LDLCALC 69  TRIG 95  CHOLHDL 2.5   Thyroid Function Tests: No results for input(s): TSH, T4TOTAL, FREET4, T3FREE, THYROIDAB in the last 72 hours. Anemia Panel: Recent Labs    06/07/19 0315  FERRITIN 509*   Urine analysis:    Component Value Date/Time    COLORURINE YELLOW (A) 06/01/2019 1412   APPEARANCEUR CLOUDY (A) 06/01/2019 1412   LABSPEC 1.030 06/01/2019 1412   PHURINE 5.0 06/01/2019 1412   GLUCOSEU >=500 (A) 06/01/2019 1412   HGBUR SMALL (A) 06/01/2019 1412   BILIRUBINUR NEGATIVE 06/01/2019 1412   KETONESUR 5 (A) 06/01/2019 1412   PROTEINUR 100 (A) 06/01/2019 1412   NITRITE NEGATIVE 06/01/2019 1412   LEUKOCYTESUR NEGATIVE 06/01/2019 1412   Sepsis Labs: (procalcitonin:4,lacticidven:4)  ) Recent Results (from the past 240 hour(s))  SARS-COV-2 RNA,(COVID-19) QUAL NAAT     Status: Abnormal   Collection Time: 05/31/19 11:19 AM   Specimen: Respiratory  Result Value Ref Range Status   SARS CoV2 RNA DETECTED (A) NOT DETECT Final    Comment: . A Detected result is considered a positive test result for COVID-19.  This indicates that RNA from SARS-CoV-2 (formerly 2019-nCoV) was detected, and the patient is infected with the virus and presumed to be contagious. If requested by public health authority, specimen will be sent for additional testing. . Please review the "Fact Sheets" and FDA authorized labeling available for health care providers and patients using the following websites: https://www.[facility].com/home/Covid-19/HCP/[facility]/fact- sheet.html https://www.[facility].com/home/Covid-19/Patients/ [facility]/fact-sheet.html . This test has been authorized by the FDA under an  Emergency Use Authorization (EUA) for use by authorized laboratories. . Due to the current public health emergency, [facility] is receiving a high volume of samples from a wide variety of swabs and media for COVID-19 testing. In order to serve patients during this public health crisis, samples from appropri ate clinical sources are  being tested. Negative test results derived from specimens received in non-commercially manufactured viral collection and transport media, or in media and sample collection kits not yet  authorized by FDA for COVID-19 testing should be cautiously evaluated and the patient potentially subjected to extra precautions such as additional clinical monitoring, including collection of an additional specimen. . Methodology:  Nucleic Acid Amplification Test (NAAT) includes RT-PCR or TMA . Additional information about COVID-19 can be found at the [employer] website: www.[email or web address].com/Covid19.   Respiratory Panel by RT PCR (Flu A&B, Covid) - Nasopharyngeal Swab     Status: Abnormal   Collection Time: 06/01/19  1:53 PM   Specimen: Nasopharyngeal Swab  Result Value Ref Range Status   SARS Coronavirus 2  by RT PCR POSITIVE (A) NEGATIVE Final    Comment: CRITICAL RESULT CALLED TO, READ BACK BY AND VERIFIED WITH: JENNIFER WHITLEY 06/01/19 1454 KLW (NOTE) SARS-CoV-2 target nucleic acids are DETECTED. SARS-CoV-2 RNA is generally detectable in upper respiratory specimens  during the acute phase of infection. Positive results are indicative of the presence of the identified virus, but do not rule out bacterial infection or co-infection with other pathogens not detected by the test. Clinical correlation with patient history and other diagnostic information is necessary to determine patient infection status. The expected result is Negative. Fact Sheet for Patients:  https://www.moore.com/ Fact Sheet for Healthcare Providers: https://www.young.biz/ This test is not yet approved or cleared by the [place] FDA and  has been authorized for detection and/or diagnosis of SARS-CoV-2 by FDA under an Emergency Use Authorization (EUA).  This EUA will remain in effect (meaning this test can be  used) for the duration of  the COVID-19 declaration under Section 564(b)(1) of the Act, 21 U.S.C. section 360bbb-3(b)(1), unless the authorization is terminated or revoked sooner.    Influenza A by PCR NEGATIVE NEGATIVE Final   Influenza B by PCR  NEGATIVE NEGATIVE Final    Comment: (NOTE) The Xpert Xpress SARS-CoV-2/FLU/RSV assay is intended as an aid in  the diagnosis of influenza from Nasopharyngeal swab specimens and  should not be used as a sole basis for treatment. Nasal washings and  aspirates are unacceptable for Xpert Xpress SARS-CoV-2/FLU/RSV  testing. Fact Sheet for Patients: https://www.moore.com/ Fact Sheet for Healthcare Providers: https://www.young.biz/ This test is not yet approved or cleared by the [place] FDA and  has been authorized for detection and/or diagnosis of SARS-CoV-2 by  FDA under an Emergency Use Authorization (EUA). This EUA will remain  in effect (meaning this test can be used) for the duration of the  Covid-19 declaration under Section 564(b)(1) of the Act, 21  U.S.C. section 360bbb-3(b)(1), unless the authorization is  terminated or revoked. Performed at [facility], [address], [place], [place] [address]   Urine culture     Status: None   Collection Time: 06/01/19  2:10 PM   Specimen: Urine, Random  Result Value Ref Range Status   Specimen Description   Final    URINE, RANDOM Performed at [facility], [address], [place], [place] [address]    Special Requests   Final    NONE Performed at [facility], [address], [place], [place] [address]    Culture   Final    NO GROWTH Performed at [facility], [address], [place], [place] [address]    Report Status 06/03/2019 FINAL  Final  Respiratory Panel by RT PCR (Flu A&B, Covid) - Nasopharyngeal Swab     Status: Abnormal   Collection Time: 05/11/2019  9:30 PM   Specimen: Nasopharyngeal Swab  Result Value Ref Range Status   SARS Coronavirus 2 by RT PCR POSITIVE (A) NEGATIVE Final    Comment: RESULT CALLED TO, READ BACK BY AND VERIFIED WITH: MYRICK,B. AT 2219 ON 05/19/2019 BY EVA (NOTE) SARS-CoV-2 target nucleic acids are DETECTED. SARS-CoV-2  RNA is generally detectable in upper respiratory specimens  during the acute phase of infection. Positive results are indicative of the presence of the identified virus, but do not rule out bacterial infection or co-infection with other pathogens not detected by the test. Clinical correlation with patient history and other diagnostic information is necessary to determine patient infection status. The expected result is  Negative. Fact Sheet for Patients:  https://www.moore.com/ Fact Sheet for Healthcare Providers: https://www.young.biz/ This test is not yet approved or cleared by the [place] FDA and  has been authorized for detection and/or diagnosis of SARS-CoV-2 by FDA under an Emergency Use Authorization (EUA).  This EUA will remain in effect (meaning this test can be used ) for the duration of  the COVID-19 declaration under Section 564(b)(1) of the Act, 21 U.S.C. section 360bbb-3(b)(1), unless the authorization is terminated or revoked sooner.    Influenza A by PCR NEGATIVE NEGATIVE Final   Influenza B by PCR NEGATIVE NEGATIVE Final    Comment: (NOTE) The Xpert Xpress SARS-CoV-2/FLU/RSV assay is intended as an aid in  the diagnosis of influenza from Nasopharyngeal swab specimens and  should not be used as a sole basis for treatment. Nasal washings and  aspirates are unacceptable for Xpert Xpress SARS-CoV-2/FLU/RSV  testing. Fact Sheet for Patients: https://www.moore.com/ Fact Sheet for Healthcare Providers: https://www.young.biz/ This test is not yet approved or cleared by the [place] FDA and  has been authorized for detection and/or diagnosis of SARS-CoV-2 by  FDA under an Emergency Use Authorization (EUA). This EUA will remain  in effect (meaning this test can be used) for the duration of the  Covid-19 declaration under Section 564(b)(1) of the Act, 21  U.S.C. section 360bbb-3(b)(1),  unless the authorization is  terminated or revoked. Performed at [facility], [address], [place], [place] [address]          Radiology Studies: DG Chest 1 View  Result Date: 05/22/2019 CLINICAL DATA:  Shortness of breath, COVID-19 positive EXAM: CHEST  1 VIEW COMPARISON:  06/01/2019 FINDINGS: Heart size is within normal limits. Interval development of extensive airspace opacities throughout the right lung, more consolidative within the right lower lobe. Streaky left basilar airspace opacity. No pleural effusion or pneumothorax. IMPRESSION: Interval development of extensive airspace opacities throughout the right lung, more consolidative within the right lower lobe. Streaky left basilar airspace opacity. Findings suspicious for multifocal atypical/viral pneumonia. Electronically Signed   By: Duanne Guess D.O.   On: 06/01/2019 13:43   Portable chest 1 View  Result Date: 06/07/2019 CLINICAL DATA:  Shortness of breath EXAM: PORTABLE CHEST 1 VIEW COMPARISON:  Yesterday FINDINGS: Pulmonary infiltrates asymmetric to the right where there is greater volume loss. Normal heart size when accounting for rotation. Aortic tortuosity accentuated by positioning. No evidence of effusion or pneumothorax. IMPRESSION: Bilateral pulmonary infiltrate with similar appearance to yesterday. Electronically Signed   By: Marnee Spring M.D.   On: 06/07/2019 07:39   DG Abd Portable 1V  Result Date: 06/07/2019 CLINICAL DATA:  Feeding tube placement. EXAM: PORTABLE ABDOMEN - 1 VIEW COMPARISON:  Chest x-ray from same day. FINDINGS: New feeding tube with tip in the gastric fundus. The bowel gas pattern is normal. No radio-opaque calculi or other significant radiographic abnormality are seen. No acute osseous abnormality. Unchanged bibasilar pulmonary infiltrates. IMPRESSION: Feeding tube tip in the gastric fundus. Electronically Signed   By: Obie Dredge M.D.   On: 06/07/2019 15:37        Scheduled  Meds: . vitamin C  500 mg Oral Daily  . aspirin EC  81 mg Oral Daily  . cholecalciferol  5,000 Units Oral Daily  . dexamethasone (DECADRON) injection  6 mg Intravenous Q24H  . donepezil  10 mg Oral QHS  . enoxaparin (LOVENOX) injection  1 mg/kg Subcutaneous Q12H  . finasteride  5 mg Oral Daily  . insulin aspart  0-9 Units Subcutaneous TID WC  . insulin glargine  10 Units Subcutaneous Daily  . QUEtiapine  25 mg Oral BID  . simvastatin  40 mg Oral QHS  . thiamine injection  100 mg Intravenous Daily  . zinc sulfate  220 mg Oral Daily   Continuous Infusions: . feeding supplement (OSMOLITE 1.2 CAL)    . remdesivir 100 mg in NS 100 mL 100 mg (06/07/19 1529)     LOS: 1 day   The patient is critically ill with multiple organ systems failure and requires high complexity decision making for assessment and support, frequent evaluation and titration of therapies, application of advanced monitoring technologies and extensive interpretation of multiple databases. Critical Care Time devoted to patient care services described in this note  Time spent: 40 minutes     Joeleen Wortley, Roselind Messier, MD [facility] Pager [phone]  If 7PM-7AM, please contact night-coverage [email or web address] Password [email or web address] 06/07/2019, 4:53 PM

## 2019-06-07 NOTE — Progress Notes (Signed)
ANTICOAGULATION CONSULT NOTE - Initial Consult  Pharmacy Consult for Lovenox Indication: Rule out PE  No Known Allergies  Patient Measurements: Height: 5\' 11"  (180.3 cm) Weight: 171 lb 4.8 oz (77.7 kg) IBW/kg (Calculated) : 75.3  Vital Signs: Temp: 97.8 F (36.6 C) (12/29 0800) Temp Source: Axillary (12/29 0800) BP: 142/74 (12/29 0800) Pulse Rate: 95 (12/29 0800)  Labs: Recent Labs    06/07/2019 1347 06/07/19 0315  HGB 15.2 14.7  HCT 48.6 45.8  PLT 186 185  CREATININE 1.13 0.89    Estimated Creatinine Clearance: 58.8 mL/min (by C-G formula based on SCr of 0.89 mg/dL).   Medical History: Past Medical History:  Diagnosis Date  . BPH (benign prostatic hyperplasia)   . Cataract   . Diabetes mellitus   . Hypercholesteremia   . Hypertension     Medications:  Scheduled:  . vitamin C  500 mg Oral Daily  . aspirin EC  81 mg Oral Daily  . cholecalciferol  5,000 Units Oral Daily  . dexamethasone (DECADRON) injection  6 mg Intravenous Q24H  . donepezil  10 mg Oral QHS  . enoxaparin (LOVENOX) injection  1 mg/kg Subcutaneous Q12H  . finasteride  5 mg Oral Daily  . insulin aspart  0-9 Units Subcutaneous TID WC  . insulin glargine  10 Units Subcutaneous Daily  . QUEtiapine  25 mg Oral BID  . simvastatin  40 mg Oral QHS  . thiamine injection  100 mg Intravenous Daily  . zinc sulfate  220 mg Oral Daily   Infusions:  . feeding supplement (OSMOLITE 1.2 CAL)    . remdesivir 100 mg in NS 100 mL 100 mg (06/07/19 1529)   PRN: acetaminophen **OR** acetaminophen, acetaminophen, haloperidol lactate, hydrALAZINE, ondansetron **OR** ondansetron (ZOFRAN) IV  Assessment: 85 yo male admitted with COVID-19 pneumonia.  Pharmacy consulted for full-dose Lovenox due to d-dimer > 20 and worsening hypoxia.  SCr and CBC wnl.  Goal of Therapy:  Anti-Xa level 0.6-1 units/ml 4hrs after LMWH dose given Monitor platelets by anticoagulation protocol: Yes   Plan:  Lovenox 1mg /kg SQ  q12h Monitor renal function, CBC, signs/symptoms of bleeding  Peggyann Juba, PharmD, BCPS Pharmacy: [phone] 06/07/2019,4:48 PM

## 2019-06-08 ENCOUNTER — Inpatient Hospital Stay (HOSPITAL_COMMUNITY): Payer: Medicare Other

## 2019-06-08 ENCOUNTER — Inpatient Hospital Stay (HOSPITAL_COMMUNITY): Admit: 2019-06-08 | Payer: Medicare Other

## 2019-06-08 ENCOUNTER — Encounter (HOSPITAL_COMMUNITY): Payer: Medicare Other

## 2019-06-08 DIAGNOSIS — F0150 Vascular dementia without behavioral disturbance: Secondary | ICD-10-CM

## 2019-06-08 DIAGNOSIS — Z4659 Encounter for fitting and adjustment of other gastrointestinal appliance and device: Secondary | ICD-10-CM

## 2019-06-08 LAB — COMPREHENSIVE METABOLIC PANEL
ALT: 19 U/L (ref 0–44)
AST: 34 U/L (ref 15–41)
Albumin: 3.1 g/dL — ABNORMAL LOW (ref 3.5–5.0)
Alkaline Phosphatase: 95 U/L (ref 38–126)
Anion gap: 14 (ref 5–15)
BUN: 24 mg/dL — ABNORMAL HIGH (ref 8–23)
CO2: 24 mmol/L (ref 22–32)
Calcium: 8.8 mg/dL — ABNORMAL LOW (ref 8.9–10.3)
Chloride: 106 mmol/L (ref 98–111)
Creatinine, Ser: 0.99 mg/dL (ref 0.61–1.24)
GFR calc Af Amer: >60 mL/min (ref >60)
GFR calc non Af Amer: >60 mL/min (ref >60)
Glucose, Bld: 228 mg/dL — ABNORMAL HIGH (ref 70–99)
Potassium: 4.2 mmol/L (ref 3.5–5.1)
Sodium: 144 mmol/L (ref 135–145)
Total Bilirubin: 1.1 mg/dL (ref 0.3–1.2)
Total Protein: 7.6 g/dL (ref 6.5–8.1)

## 2019-06-08 LAB — GLUCOSE, CAPILLARY
Glucose-Capillary: 219 mg/dL — ABNORMAL HIGH (ref 70–99)
Glucose-Capillary: 302 mg/dL — ABNORMAL HIGH (ref 70–99)
Glucose-Capillary: 336 mg/dL — ABNORMAL HIGH (ref 70–99)
Glucose-Capillary: 193 mg/dL — ABNORMAL HIGH (ref 70–99)
Glucose-Capillary: 158 mg/dL — ABNORMAL HIGH (ref 70–99)
Glucose-Capillary: 210 mg/dL — ABNORMAL HIGH (ref 70–99)

## 2019-06-08 LAB — CBC WITH DIFFERENTIAL/PLATELET
Abs Immature Granulocytes: 0.12 10*3/uL — ABNORMAL HIGH (ref 0.00–0.07)
Basophils Absolute: 0 10*3/uL (ref 0.0–0.1)
Basophils Relative: 0 %
Eosinophils Absolute: 0 10*3/uL (ref 0.0–0.5)
Eosinophils Relative: 0 %
HCT: 45.2 % (ref 39.0–52.0)
Hemoglobin: 14.3 g/dL (ref 13.0–17.0)
Immature Granulocytes: 1 %
Lymphocytes Relative: 4 %
Lymphs Abs: 0.5 10*3/uL — ABNORMAL LOW (ref 0.7–4.0)
MCH: 29.7 pg (ref 26.0–34.0)
MCHC: 31.6 g/dL (ref 30.0–36.0)
MCV: 93.8 fL (ref 80.0–100.0)
Monocytes Absolute: 0.5 10*3/uL (ref 0.1–1.0)
Monocytes Relative: 4 %
Neutro Abs: 10.1 10*3/uL — ABNORMAL HIGH (ref 1.7–7.7)
Neutrophils Relative %: 91 %
Platelets: 191 10*3/uL (ref 150–400)
RBC: 4.82 MIL/uL (ref 4.22–5.81)
RDW: 13.2 % (ref 11.5–15.5)
WBC: 11.2 10*3/uL — ABNORMAL HIGH (ref 4.0–10.5)
nRBC: 0 % (ref 0.0–0.2)

## 2019-06-08 LAB — FERRITIN: Ferritin: 546 ng/mL — ABNORMAL HIGH (ref 24–336)

## 2019-06-08 LAB — D-DIMER, QUANTITATIVE: D-Dimer, Quant: >20 ug/mL-FEU — ABNORMAL HIGH (ref 0.00–0.50)

## 2019-06-08 LAB — PHOSPHORUS: Phosphorus: 2.2 mg/dL — ABNORMAL LOW (ref 2.5–4.6)

## 2019-06-08 LAB — MAGNESIUM: Magnesium: 2.3 mg/dL (ref 1.7–2.4)

## 2019-06-08 LAB — C-REACTIVE PROTEIN: CRP: 16.5 mg/dL — ABNORMAL HIGH (ref <1.0)

## 2019-06-08 MED ORDER — LISINOPRIL 2.5 MG PO TABS
2.5000 mg | ORAL_TABLET | Freq: Every day | ORAL | Status: DC
  Administered 2019-06-08 – 2019-06-11 (×4): 2.5 mg via ORAL
  Filled 2019-06-08 (×4): qty 1

## 2019-06-08 MED ORDER — TOCILIZUMAB 400 MG/20ML IV SOLN
600.0000 mg | Freq: Once | INTRAVENOUS | Status: AC
  Administered 2019-06-08: 600 mg via INTRAVENOUS
  Filled 2019-06-08: qty 20

## 2019-06-08 MED ORDER — INSULIN GLARGINE 100 UNIT/ML ~~LOC~~ SOLN
15.0000 [IU] | Freq: Every day | SUBCUTANEOUS | Status: DC
  Administered 2019-06-09: 15 [IU] via SUBCUTANEOUS
  Filled 2019-06-08: qty 0.15

## 2019-06-08 MED ORDER — LORAZEPAM 2 MG/ML IJ SOLN: 1.0000 mg | Freq: Once | INTRAMUSCULAR | Status: DC

## 2019-06-08 MED ORDER — INSULIN ASPART 100 UNIT/ML ~~LOC~~ SOLN
0.0000 [IU] | SUBCUTANEOUS | Status: DC
  Administered 2019-06-08: 3 [IU] via SUBCUTANEOUS
  Administered 2019-06-08: 5 [IU] via SUBCUTANEOUS
  Administered 2019-06-08: 3 [IU] via SUBCUTANEOUS
  Administered 2019-06-09: 5 [IU] via SUBCUTANEOUS
  Administered 2019-06-09 (×2): 8 [IU] via SUBCUTANEOUS
  Administered 2019-06-09 – 2019-06-10 (×3): 11 [IU] via SUBCUTANEOUS
  Administered 2019-06-10 (×2): 15 [IU] via SUBCUTANEOUS

## 2019-06-08 NOTE — Progress Notes (Signed)
Occupational Therapy Evaluation Patient Details Name: Ethan Davis MRN: [id number] DOB: 19-Feb-1927 Today's Date: 06/08/2019    History of Present Illness 85 y.o. BM PMHx  Advanced Dementia, HLD, HTN, diabetes type 2 uncontrolled with complication, cararacts, BPH. COVID Dx from outpt testing performed on 05/31/19 after pt presented w/ hypotension and dizziness, ppossible sinusitis and UTI.    Clinical Impression   Spoke with patient's [name] regarding prior level of function. Patient's other daughter is currently hospitalized at [facility]. Pt lives at home with family members assisting/supervising during the day as needed. Pt ambulates without an AD and completed his own ADL tasks with the exception of his daughter washing his back. Pt was restless during session (had Haldol prior to session) but was able to progress to EOB with mod A and stand x 2 with mod A +2. Unable to ambulate at this time due to generalized weakness. Desat into low 80s on 10L HFNC with mobility although quickly rebounds to 90 with rest. Pt inconsistently following commands. Family would like for pt to return home with [facility] services if able. Will follow acutely. Recommend safety sitter in order to progress pt and facilitate safe DC home.    Follow Up Recommendations  SNF;Home health OT;Supervision/Assistance - 24 hour(family prefers home)    Equipment Recommendations  3 in 1 bedside commode;Wheelchair (measurements OT);Wheelchair cushion (measurements OT)    Recommendations for Other Services       Precautions / Restrictions Precautions Precautions: Fall      Mobility Bed Mobility Overal bed mobility: Needs Assistance Bed Mobility: Supine to Sit     Supine to sit: Mod assist        Transfers Overall transfer level: Needs assistance Equipment used: 2 person hand held assist Transfers: Sit to/from Stand Sit to Stand: Mod assist;+2 physical assistance              Balance Overall balance  assessment: Needs assistance   Sitting balance-Leahy Scale: Poor       Standing balance-Leahy Scale: Poor                             ADL either performed or assessed with clinical judgement   ADL Overall ADL's : Needs assistance/impaired Eating/Feeding: NPO Eating/Feeding Details (indicate cue type and reason): pulled out NG tube Grooming: Maximal assistance;Cueing for sequencing Grooming Details (indicate cue type and reason): Pt wiped eyes and then would not comlete task Upper Body Bathing: Maximal assistance;Sitting   Lower Body Bathing: Maximal assistance;Sit to/from stand   Upper Body Dressing : Maximal assistance;Sitting   Lower Body Dressing: Maximal assistance;Sit to/from stand   Toilet Transfer: +2 for physical assistance;Moderate assistance(sit - stand)   Toileting- Clothing Manipulation and Hygiene: Total assistance       Functional mobility during ADLs: Maximal assistance;+2 for physical assistance;+2 for safety/equipment       Vision Baseline Vision/History: Cataracts Additional Comments: kept eyes closed majority of session; daughter states pt can see     Perception     Praxis      Pertinent Vitals/Pain Pain Assessment: Faces Faces Pain Scale: No hurt     Hand Dominance     Extremity/Trunk Assessment Upper Extremity Assessment Upper Extremity Assessment: RUE deficits/detail RUE Deficits / Details: generalized weakness; AROM appears [facility]   Lower Extremity Assessment Lower Extremity Assessment: Defer to PT evaluation   Cervical / Trunk Assessment Cervical / Trunk Assessment: Kyphotic   Communication Communication Communication: [facility]  Cognition Arousal/Alertness: Lethargic Behavior During Therapy: Restless;Impulsive;Flat affect Overall Cognitive Status: Impaired/Different from baseline Area of Impairment: Orientation;Attention;Memory;Following commands;Safety/judgement;Awareness;Problem solving                  Orientation Level: Disoriented to;Place;Time;Situation Current Attention Level: Focused Memory: Decreased short-term memory Following Commands: Follows one step commands inconsistently Safety/Judgement: Decreased awareness of safety;Decreased awareness of deficits Awareness: Intellectual Problem Solving: Slow processing;Decreased initiation;Difficulty sequencing General Comments: appears more confused than basline   General Comments       Exercises     Shoulder Instructions      Home Living Family/patient expects to be discharged to:: Private residence Living Arrangements: Children Available Help at Discharge: Available 24 hours/day Type of Home: House Home Access: Ramped entrance     Home Layout: One level     Bathroom Shower/Tub: Occupational psychologist: Standard Bathroom Accessibility: Yes How Accessible: Accessible via wheelchair Home Equipment: [place] - 2 wheels;Shower seat   Additional Comments: pt unable to give PLOF; home set up      Prior Functioning/Environment Level of Independence: Independent;Needs assistance  Gait / Transfers Assistance Needed: Did not use an AD ADL's / Homemaking Assistance Needed: pt comleted his ADL with the only exception of his daughter washing his back Communication / Swallowing Assistance Needed: HOH Comments: information from daughter via phone        OT Problem List: Decreased strength;Decreased activity tolerance;Impaired balance (sitting and/or standing);Decreased cognition;Decreased safety awareness;Decreased knowledge of use of DME or AE;Cardiopulmonary status limiting activity      OT Treatment/Interventions: Self-care/ADL training;Therapeutic exercise;Neuromuscular education;Energy conservation;DME and/or AE instruction;Therapeutic activities;Cognitive remediation/compensation;Patient/family education;Balance training    OT Goals(Current goals can be found in the care plan section) Acute Rehab OT  Goals Patient Stated Goal: per family to return home OT Goal Formulation: With family Time For Goal Achievement: 06/22/19 Potential to Achieve Goals: Good  OT Frequency: Min 2X/week   Barriers to D/C:            Co-evaluation              AM-PAC OT "6 Clicks" Daily Activity     Outcome Measure Help from another person eating meals?: Total Help from another person taking care of personal grooming?: A Lot Help from another person toileting, which includes using toliet, bedpan, or urinal?: Total Help from another person bathing (including washing, rinsing, drying)?: A Lot Help from another person to put on and taking off regular upper body clothing?: A Lot Help from another person to put on and taking off regular lower body clothing?: Total 6 Click Score: 9   End of Session Equipment Utilized During Treatment: Gait belt;Oxygen(10L) Nurse Communication: Mobility status  Activity Tolerance: Patient limited by lethargy;Other (comment)(pt restless) Patient left: in bed;with call bell/phone within reach;with bed alarm set;with restraints reapplied  OT Visit Diagnosis: Unsteadiness on feet (R26.81);Other abnormalities of gait and mobility (R26.89);Muscle weakness (generalized) (M62.81);Other symptoms and signs involving cognitive function;Feeding difficulties (R63.3)                Time: 8921-1941 OT Time Calculation (min): 44 min Charges:  OT General Charges $OT Visit: 1 Visit OT Evaluation $OT Eval Moderate Complexity: 1 Mod OT Treatments $Self Care/Home Management : 23-37 mins  Maurie Boettcher, OT/L   Acute OT Clinical Specialist Acute Rehabilitation Services Pager [phone] Office [phone]   [facility] 06/08/2019, 2:23 PM

## 2019-06-08 NOTE — TOC Initial Note (Signed)
Transition of Care [facility]) - Initial/Assessment Note    Patient Details  Name: Ethan Davis MRN: [id number] Date of Birth: 01-04-1927  Transition of Care [facility]) CM/SW Contact:    Elliot Gault, LCSW Phone Number: 06/08/2019, 11:23 AM  Clinical Narrative:                  Pt admitted from home. Reviewed pt's record today. Pt is active with Community Palliative care with [facility] and they have regular APNP and MSW visits with pt. Many of these visits have been virtual visits during the pandemic.  From the notes, it appears pt lives in his own home and family members rotate caregiving during the day. It appears pt has been able to be home alone overnight. He does have MOW.  TOC will continue to assess and assist with dc planning needs as pt's stay progresses.  Expected Discharge Plan: Home w Home Health Services Barriers to Discharge: Continued Medical Work up   Patient Goals and CMS Choice        Expected Discharge Plan and Services Expected Discharge Plan: Home w Home Health Services In-house Referral: Clinical Social Work     Living arrangements for the past 2 months: Single Family Home                                      Prior Living Arrangements/Services Living arrangements for the past 2 months: Single Family Home Lives with:: Self Patient language and need for interpreter reviewed:: Yes Do you feel safe going back to the place where you live?: Yes      Need for Family Participation in Patient Care: Yes (Comment) Care giver support system in place?: Yes (comment) Current home services: DME Criminal Activity/Legal Involvement Pertinent to Current Situation/Hospitalization: No - Comment as needed  Activities of Daily Living Home Assistive Devices/Equipment: Walker (specify type) ADL Screening (condition at time of admission) Patient's cognitive ability adequate to safely complete daily activities?: No Is the patient deaf or have difficulty hearing?:  Yes Does the patient have difficulty seeing, even when wearing glasses/contacts?: No Does the patient have difficulty concentrating, remembering, or making decisions?: Yes Patient able to express need for assistance with ADLs?: No Does the patient have difficulty dressing or bathing?: Yes Independently performs ADLs?: No Communication: Independent Dressing (OT): Needs assistance Is this a change from baseline?: Pre-admission baseline Grooming: Needs assistance Is this a change from baseline?: Pre-admission baseline Feeding: Needs assistance Is this a change from baseline?: Pre-admission baseline Bathing: Needs assistance Is this a change from baseline?: Pre-admission baseline Toileting: Needs assistance Is this a change from baseline?: Pre-admission baseline In/Out Bed: Needs assistance Is this a change from baseline?: Pre-admission baseline Walks in Home: Needs assistance Is this a change from baseline?: Pre-admission baseline Does the patient have difficulty walking or climbing stairs?: Yes Weakness of Legs: Both Weakness of Arms/Hands: None  Permission Sought/Granted                  Emotional Assessment       Orientation: : Oriented to Self Alcohol / Substance Use: Not Applicable Psych Involvement: No (comment)  Admission diagnosis:  Acute respiratory failure (HCC) [J96.00] Shortness of breath [R06.02] Hypoxia [R09.02] COVID-19 [U07.1] Patient Active Problem List   Diagnosis Date Noted  . Pneumonia due to COVID-19 virus 06/07/2019  . Acute respiratory failure with hypoxia (HCC) 06/07/2019  . Elevated d-dimer 06/07/2019  .  Essential hypertension 06/07/2019  . HLD (hyperlipidemia) 06/07/2019  . Diabetes mellitus type 2, uncontrolled, with complications ([place]) 22/48/2500  . Diabetic neuropathy ([place]) 06/07/2019  . Acute respiratory failure ([place]) 06/01/2019  . COVID-19 virus infection 06/07/2019  . Dementia without behavioral disturbance ([place]) 06/02/2019  . End  of life care 05/03/2018  . Protein-calorie malnutrition ([place]) 11/09/2017  . CKD (chronic kidney disease), stage II 06/27/2017  . Failure to thrive (0-17) 06/27/2017  . Loss of weight 07/04/2014  . Lumbar back pain 12/20/2013  . Cognitive impairment 05/30/2013  . Type 2 diabetes with nephropathy ([place]) 03/02/2013  . Essential hypertension, benign 03/02/2013  . Hyperlipidemia 03/02/2013  . OA (osteoarthritis) 03/02/2013  . Benign prostatic hyperplasia 03/02/2013   PCP:  Alycia Rossetti, MD Pharmacy:   [facility], [place] [address] Phone: [phone] Fax: [phone]  [id number] [place], [place] - [address] [place] [address] Phone: [phone] Fax: [phone]     Social Determinants of Health (SDOH) Interventions    Readmission Risk Interventions Readmission Risk Prevention Plan 06/08/2019  Transportation Screening Complete  Medication Review (RN Care Manager) Complete  Some recent data might be hidden

## 2019-06-08 NOTE — Progress Notes (Signed)
PROGRESS NOTE    Ethan Davis  ZOX:[id number] DOB: 02-Apr-1927 DOA: 06/19/19 PCP: Salley Scarlet, MD   Brief Narrative:  85 y.o. BM PMHx  Advanced Dementia, HLD, HTN, diabetes type 2 uncontrolled with complication, cararacts, BPH.   Presenting w/ known COVID Dx from outpt testing performed on 05/31/19 after pt presented w/ hypotension and dizziness, ppossible sinusitis and UTI. Started on Levaquin at that time.  On 06/01/19 pt presented to ED w/ fever and increasing AMS. Found to be COVID + at that time. Prior COVID test was still pending. Pt was given several liters of IVF and sent home w/ family at that time. No evidence of respiratory decline at that time. Pt was contacted on 06/02/19 for treatement of COVID w/ bamlanivimab infusion at [facility] but no call back from family was reported.   Per reports pt has had very little to eat since the time of his dx due to nausea and vomiting. Family has been giving hime Pedialyte and reported an O2 saturation this am of 83%.    Subjective: 12/30 A/O x1 (does not know where, when, why).  Does not follow commands mumbles when you ask him a question    Assessment & Plan:   Active Problems:   Type 2 diabetes with nephropathy (HCC)   Essential hypertension, benign   Hyperlipidemia   Protein-calorie malnutrition (HCC)   Acute respiratory failure (HCC)   COVID-19 virus infection   Dementia without behavioral disturbance (HCC)   Pneumonia due to COVID-19 virus   Acute respiratory failure with hypoxia (HCC)   Elevated d-dimer   Essential hypertension   HLD (hyperlipidemia)   Diabetes mellitus type 2, uncontrolled, with complications (HCC)   Diabetic neuropathy (HCC)   Covid pneumonia/acute respiratory failure with hypoxia COVID-19 Labs  Recent Labs    06/07/19 0315 06/08/19 0005  DDIMER >20.00* >20.00*  FERRITIN 509* 546*  CRP 17.3* 16.5*    Lab Results  Component Value Date   SARSCOV2NAA POSITIVE (A) 06/19/19   SARSCOV2NAA  POSITIVE (A) 06/01/2019   SARSCOV2NAA DETECTED (A) 05/31/2019  -Decadron 6 mg daily -Remdesivir per pharmacy protocol -12/29 spoke with Iris (daughter) counseled her that the use of Actemra is off label and lacks randomized control trial data for this medication.  Counseled that improvement in patient's has been anecdotal in nature.  She agreed to allow [place] to use Actemra. -12/30 Actemra x1   Elevated D-dimer -CTA PE protocol pending -Lower extremity Doppler pending -10/29 CTA PE protocol negative for PE -10/30 bilateral lower extremity Dopplers pending.  Continue on treatment dose Lovenox  Essential HTN -Lisinopril 2.5 mg daily -Hydralazine PRN  HLD -12/16 LDL= 79 -12/29 increase Zocor 40 mg daily  Diabetes type 2 uncontrolled with complication/diabetic neuropathy -12/16 hemoglobin A1c= 12.5 -12/30 increase Lantus 15 units daily -12/30 increase moderate SSI  Protein calorie malnutrition  -12/29 CorTrak will be placed today. -Tube feeds per nutrition recommendations  Altered mental status -Per Iris (daughter) patient normally A/O x4, able to perform ADLs.   DVT prophylaxis: Lovenox treatment dose Code Status: Full Family Communication: 12/29 spoke with [name] (daughter) explained plan of care answered all questions Disposition Plan: TBD   Consultants:    Procedures/Significant Events:  12/29 CTA PE protocol; negative for PE   I have personally reviewed and interpreted all radiology studies and my findings are as above.  VENTILATOR SETTINGS: HFNC 12/30 Flow; 6 L/min SPO2; 93%    Cultures 12/28 SARS coronavirus positive 12/28 influenza A/B negative  Antimicrobials: Anti-infectives (From admission, onward)   Start     Dose/Rate Stop   06/07/19 1600  remdesivir 100 mg in sodium chloride 0.9 % 100 mL IVPB  Status:  Discontinued     100 mg 200 mL/hr over 30 Minutes 06/07/19 0017   06/07/19 1600  remdesivir 100 mg in sodium chloride 0.9 % 100 mL IVPB      100 mg 200 mL/hr over 30 Minutes 06/11/19 0959   06/07/19 1000  remdesivir 100 mg in sodium chloride 0.9 % 100 mL IVPB  Status:  Discontinued     100 mg 200 mL/hr over 30 Minutes 06/07/19 0015   06/07/19 0030  remdesivir 200 mg in sodium chloride 0.9% 250 mL IVPB     200 mg 580 mL/hr over 30 Minutes 06/07/19 0214   07-06-2019 1800  remdesivir 200 mg in sodium chloride 0.9% 250 mL IVPB  Status:  Discontinued     200 mg 580 mL/hr over 30 Minutes 06/07/19 0017       Devices   LINES / TUBES:      Continuous Infusions: . feeding supplement (OSMOLITE 1.2 CAL) 65 mL/hr at 06/08/19 0700  . remdesivir 100 mg in NS 100 mL 100 mg (06/07/19 1529)     Objective: Vitals:   06/08/19 0230 06/08/19 0400 06/08/19 0500 06/08/19 0745  BP:  (!) 148/89  (!) 151/86  Pulse:  99  94  Resp:  16  20  Temp:  97.9 F (36.6 C)  98.2 F (36.8 C)  TempSrc:  Axillary  Axillary  SpO2: (!) 86% 91%  92%  Weight:   74 kg   Height:        Intake/Output Summary (Last 24 hours) at 06/08/2019 1610 Last data filed at 06/08/2019 0800 Gross per 24 hour  Intake 489.17 ml  Output 1250 ml  Net -760.83 ml   Filed Weights   06/07/19 0004 06/08/19 0500  Weight: 77.7 kg 74 kg   Physical Exam:  General: A/O x1 (does not know where, when, why), does not follow commands, positive acute respiratory distress, cachectic Eyes: negative scleral hemorrhage, negative anisocoria, negative icterus ENT: Negative Runny nose, negative gingival bleeding, Neck:  Negative scars, masses, torticollis, lymphadenopathy, JVD Lungs: Clear to auscultation bilaterally without wheezes or crackles Cardiovascular: Regular rate and rhythm without murmur gallop or rub normal S1 and S2 Abdomen: negative abdominal pain, nondistended, positive soft, bowel sounds, no rebound, no ascites, no appreciable mass Extremities: No significant cyanosis, clubbing, or edema bilateral lower extremities Skin: Negative rashes, lesions,  ulcers Psychiatric: Unable to assess secondary to AMS  Central nervous system: Unable to assess secondary to AMS.  Patient did not follow commands. .     Data Reviewed: Care during the described time interval was provided by me .  I have reviewed this patient's available data, including medical history, events of note, physical examination, and all test results as part of my evaluation.   CBC: Recent Labs  Lab 06/01/19 1102 07/06/19 1347 06/07/19 0315 06/08/19 0005  WBC 5.8 7.0 6.3 11.2*  NEUTROABS 4.2 5.5  --  10.1*  HGB 14.0 15.2 14.7 14.3  HCT 42.0 48.6 45.8 45.2  MCV 90.3 94.0 92.2 93.8  PLT 98* 186 185 191   Basic Metabolic Panel: Recent Labs  Lab 06/01/19 1102 2019/07/06 1347 06/07/19 0315 06/08/19 0005  NA 132* 141 142 144  K 4.5 4.3 4.6 4.2  CL 97* 101 106 106  CO2 21* GLUCOSE 365*  245* 248* 228*  BUN 20 21 18  24*  CREATININE 1.34* 1.13 0.89 0.99  CALCIUM 8.5* 8.9 8.5* 8.8*  MG  --  2.4 2.4 2.3  PHOS  --   --  3.0 2.2*   GFR: Estimated Creatinine Clearance: 51.9 mL/min (by C-G formula based on SCr of 0.99 mg/dL). Liver Function Tests: Recent Labs  Lab 06/01/19 1102 2019/06/27 1347 06/07/19 0315 06/08/19 0005  AST 31 35 31 34  ALT 17 20 19 19   ALKPHOS 92 92 89 95  BILITOT 0.6 1.1 1.0 1.1  PROT 7.3 7.7 7.2 7.6  ALBUMIN 3.6 3.1* 3.1* 3.1*   No results for input(s): LIPASE, AMYLASE in the last 168 hours. No results for input(s): AMMONIA in the last 168 hours. Coagulation Profile: No results for input(s): INR, PROTIME in the last 168 hours. Cardiac Enzymes: No results for input(s): CKTOTAL, CKMB, CKMBINDEX, TROPONINI in the last 168 hours. BNP (last 3 results) No results for input(s): PROBNP in the last 8760 hours. HbA1C: Recent Labs    06/07/19 0315  HGBA1C 12.4*   CBG: Recent Labs  Lab 06/07/19 0740 06/07/19 1147 06/07/19 1625 06/07/19 2030 06/08/19 0041  GLUCAP 265* 262* 186* 191* 219*   Lipid Profile: Recent Labs     06/07/19 0315  CHOL 146  HDL 58  LDLCALC 69  TRIG 95  CHOLHDL 2.5   Thyroid Function Tests: No results for input(s): TSH, T4TOTAL, FREET4, T3FREE, THYROIDAB in the last 72 hours. Anemia Panel: Recent Labs    06/07/19 0315 06/08/19 0005  FERRITIN 509* 546*   Urine analysis:    Component Value Date/Time   COLORURINE YELLOW (A) 06/01/2019 1412   APPEARANCEUR CLOUDY (A) 06/01/2019 1412   LABSPEC 1.030 06/01/2019 1412   PHURINE 5.0 06/01/2019 1412   GLUCOSEU >=500 (A) 06/01/2019 1412   HGBUR SMALL (A) 06/01/2019 1412   BILIRUBINUR NEGATIVE 06/01/2019 1412   KETONESUR 5 (A) 06/01/2019 1412   PROTEINUR 100 (A) 06/01/2019 1412   NITRITE NEGATIVE 06/01/2019 1412   LEUKOCYTESUR NEGATIVE 06/01/2019 1412   Sepsis Labs: @LABRCNTIP (procalcitonin:4,lacticidven:4)  ) Recent Results (from the past 240 hour(s))  SARS-COV-2 RNA,(COVID-19) QUAL NAAT     Status: Abnormal   Collection Time: 05/31/19 11:19 AM   Specimen: Respiratory  Result Value Ref Range Status   SARS CoV2 RNA DETECTED (A) NOT DETECT Final    Comment: . A Detected result is considered a positive test result for COVID-19.  This indicates that RNA from SARS-CoV-2 (formerly 2019-nCoV) was detected, and the patient is infected with the virus and presumed to be contagious. If requested by public health authority, specimen will be sent for additional testing. . Please review the "Fact Sheets" and FDA authorized labeling available for health care providers and patients using the following websites: https://www.[facility].com/home/Covid-19/HCP/[facility]/fact- sheet.html https://www.[facility].com/home/Covid-19/Patients/ [facility]/fact-sheet.html . This test has been authorized by the FDA under an  Emergency Use Authorization (EUA) for use by authorized laboratories. . Due to the current public health emergency, [facility] is receiving a high volume of samples from a wide variety of swabs and media  for COVID-19 testing. In order to serve patients during this public health crisis, samples from appropri ate clinical sources are  being tested. Negative test results derived from specimens received in non-commercially manufactured viral collection and transport media, or in media and sample collection kits not yet authorized by FDA for COVID-19 testing should be cautiously evaluated and the patient potentially subjected to extra precautions such as additional clinical monitoring, including collection of  an additional specimen. . Methodology:  Nucleic Acid Amplification Test (NAAT) includes RT-PCR or TMA . Additional information about COVID-19 can be found at the [employer] website: www.[email or web address].com/Covid19.   Respiratory Panel by RT PCR (Flu A&B, Covid) - Nasopharyngeal Swab     Status: Abnormal   Collection Time: 06/01/19  1:53 PM   Specimen: Nasopharyngeal Swab  Result Value Ref Range Status   SARS Coronavirus 2 by RT PCR POSITIVE (A) NEGATIVE Final    Comment: CRITICAL RESULT CALLED TO, READ BACK BY AND VERIFIED WITH: JENNIFER WHITLEY 06/01/19 1454 KLW (NOTE) SARS-CoV-2 target nucleic acids are DETECTED. SARS-CoV-2 RNA is generally detectable in upper respiratory specimens  during the acute phase of infection. Positive results are indicative of the presence of the identified virus, but do not rule out bacterial infection or co-infection with other pathogens not detected by the test. Clinical correlation with patient history and other diagnostic information is necessary to determine patient infection status. The expected result is Negative. Fact Sheet for Patients:  PinkCheek.be Fact Sheet for Healthcare Providers: GravelBags.it This test is not yet approved or cleared by the [place] FDA and  has been authorized for detection and/or diagnosis of SARS-CoV-2 by FDA under an Emergency Use  Authorization (EUA).  This EUA will remain in effect (meaning this test can be  used) for the duration of  the COVID-19 declaration under Section 564(b)(1) of the Act, 21 U.S.C. section 360bbb-3(b)(1), unless the authorization is terminated or revoked sooner.    Influenza A by PCR NEGATIVE NEGATIVE Final   Influenza B by PCR NEGATIVE NEGATIVE Final    Comment: (NOTE) The Xpert Xpress SARS-CoV-2/FLU/RSV assay is intended as an aid in  the diagnosis of influenza from Nasopharyngeal swab specimens and  should not be used as a sole basis for treatment. Nasal washings and  aspirates are unacceptable for Xpert Xpress SARS-CoV-2/FLU/RSV  testing. Fact Sheet for Patients: PinkCheek.be Fact Sheet for Healthcare Providers: GravelBags.it This test is not yet approved or cleared by the [place] FDA and  has been authorized for detection and/or diagnosis of SARS-CoV-2 by  FDA under an Emergency Use Authorization (EUA). This EUA will remain  in effect (meaning this test can be used) for the duration of the  Covid-19 declaration under Section 564(b)(1) of the Act, 21  U.S.C. section 360bbb-3(b)(1), unless the authorization is  terminated or revoked. Performed at [facility], [address] [place], [place] [address]   Urine culture     Status: None   Collection Time: 06/01/19  2:10 PM   Specimen: Urine, Random  Result Value Ref Range Status   Specimen Description   Final    URINE, RANDOM Performed at [facility], [address] [place], [place] [address]    Special Requests   Final    NONE Performed at [facility], [address], [place], [place] [address]    Culture   Final    NO GROWTH Performed at [facility], [place] [address] [place],  [address]    Report Status 06/03/2019 FINAL  Final  Respiratory Panel by RT PCR (Flu A&B, Covid) - Nasopharyngeal Swab     Status: Abnormal    Collection Time: 2019/06/23  9:30 PM   Specimen: Nasopharyngeal Swab  Result Value Ref Range Status   SARS Coronavirus 2 by RT PCR POSITIVE (A) NEGATIVE Final    Comment: RESULT CALLED TO, READ BACK BY AND VERIFIED WITH: MYRICK,B. AT 2219 ON 06-23-19 BY  EVA (NOTE) SARS-CoV-2 target nucleic acids are DETECTED. SARS-CoV-2 RNA is generally detectable in upper respiratory specimens  during the acute phase of infection. Positive results are indicative of the presence of the identified virus, but do not rule out bacterial infection or co-infection with other pathogens not detected by the test. Clinical correlation with patient history and other diagnostic information is necessary to determine patient infection status. The expected result is Negative. Fact Sheet for Patients:  https://www.moore.com/ Fact Sheet for Healthcare Providers: https://www.young.biz/ This test is not yet approved or cleared by the [place] FDA and  has been authorized for detection and/or diagnosis of SARS-CoV-2 by FDA under an Emergency Use Authorization (EUA).  This EUA will remain in effect (meaning this test can be used ) for the duration of  the COVID-19 declaration under Section 564(b)(1) of the Act, 21 U.S.C. section 360bbb-3(b)(1), unless the authorization is terminated or revoked sooner.    Influenza A by PCR NEGATIVE NEGATIVE Final   Influenza B by PCR NEGATIVE NEGATIVE Final    Comment: (NOTE) The Xpert Xpress SARS-CoV-2/FLU/RSV assay is intended as an aid in  the diagnosis of influenza from Nasopharyngeal swab specimens and  should not be used as a sole basis for treatment. Nasal washings and  aspirates are unacceptable for Xpert Xpress SARS-CoV-2/FLU/RSV  testing. Fact Sheet for Patients: https://www.moore.com/ Fact Sheet for Healthcare Providers: https://www.young.biz/ This test is not yet approved or cleared by  the [place] FDA and  has been authorized for detection and/or diagnosis of SARS-CoV-2 by  FDA under an Emergency Use Authorization (EUA). This EUA will remain  in effect (meaning this test can be used) for the duration of the  Covid-19 declaration under Section 564(b)(1) of the Act, 21  U.S.C. section 360bbb-3(b)(1), unless the authorization is  terminated or revoked. Performed at [facility], [address] [place], [place] [address]          Radiology Studies: DG Chest 1 View  Result Date: 27-Jun-2019 CLINICAL DATA:  Shortness of breath, COVID-19 positive EXAM: CHEST  1 VIEW COMPARISON:  06/01/2019 FINDINGS: Heart size is within normal limits. Interval development of extensive airspace opacities throughout the right lung, more consolidative within the right lower lobe. Streaky left basilar airspace opacity. No pleural effusion or pneumothorax. IMPRESSION: Interval development of extensive airspace opacities throughout the right lung, more consolidative within the right lower lobe. Streaky left basilar airspace opacity. Findings suspicious for multifocal atypical/viral pneumonia. Electronically Signed   By: Duanne Guess D.O.   On: 06-27-19 13:43   CT ANGIO CHEST PE W OR WO CONTRAST  Result Date: 06/07/2019 CLINICAL DATA:  Concern for PE.  Shortness of breath. EXAM: CT ANGIOGRAPHY CHEST WITH CONTRAST TECHNIQUE: Multidetector CT imaging of the chest was performed using the standard protocol during bolus administration of intravenous contrast. Multiplanar CT image reconstructions and MIPs were obtained to evaluate the vascular anatomy. CONTRAST:  80mL OMNIPAQUE IOHEXOL 350 MG/ML SOLN COMPARISON:  None. FINDINGS: Cardiovascular: Evaluation for pulmonary emboli is limited by extensive respiratory motion artifact.Given this limitation, no large centrally located pulmonary embolism was detected. Detection of smaller pulmonary emboli is severely limited. Main pulmonary artery is not  significantly dilated. Aortic calcifications are noted. There is no dissection. No significant aneurysm. Heart size is normal. Coronary artery calcifications are noted. There is no significant pericardial effusion. Mediastinum/Nodes: --No mediastinal or hilar lymphadenopathy. --No axillary lymphadenopathy. --No supraclavicular lymphadenopathy. --Normal thyroid gland. --The esophagus is unremarkable Lungs/Pleura: There are asymmetric ground-glass airspace opacities involving the  right upper, right middle, and right lower lobes. The left lower lobe is involved to a lesser degree. There is some right lung volume loss. There is no pneumothorax. No large pleural effusion. The trachea is unremarkable. There are mild emphysematous changes bilaterally. There is a calcified pleural based plaque at the right lung base. Upper Abdomen: No acute abnormality. The enteric tube terminates within the gastric body. Musculoskeletal: No chest wall abnormality. No acute or significant osseous findings. Review of the MIP images confirms the above findings. IMPRESSION: 1. Evaluation for pulmonary emboli is significantly limited as detailed above. Given this limitation, no PE was identified. 2. Asymmetric ground-glass airspace opacities primarily involving the right lung. Findings are consistent with the patient's reported history of viral pneumonia. Asymmetric pulmonary edema could have a similar appearance in the appropriate clinical setting. Aortic Atherosclerosis (ICD10-I70.0) and Emphysema (ICD10-J43.9). Electronically Signed   By: Katherine Mantlehristopher  Green M.D.   On: 06/07/2019 18:21   Portable chest 1 View  Result Date: 06/07/2019 CLINICAL DATA:  Shortness of breath EXAM: PORTABLE CHEST 1 VIEW COMPARISON:  Yesterday FINDINGS: Pulmonary infiltrates asymmetric to the right where there is greater volume loss. Normal heart size when accounting for rotation. Aortic tortuosity accentuated by positioning. No evidence of effusion or  pneumothorax. IMPRESSION: Bilateral pulmonary infiltrate with similar appearance to yesterday. Electronically Signed   By: Marnee SpringJonathon  Watts M.D.   On: 06/07/2019 07:39   DG Abd Portable 1V  Result Date: 06/07/2019 CLINICAL DATA:  Feeding tube placement. EXAM: PORTABLE ABDOMEN - 1 VIEW COMPARISON:  Chest x-ray from same day. FINDINGS: New feeding tube with tip in the gastric fundus. The bowel gas pattern is normal. No radio-opaque calculi or other significant radiographic abnormality are seen. No acute osseous abnormality. Unchanged bibasilar pulmonary infiltrates. IMPRESSION: Feeding tube tip in the gastric fundus. Electronically Signed   By: Obie DredgeWilliam T Derry M.D.   On: 06/07/2019 15:37        Scheduled Meds: . vitamin C  500 mg Oral Daily  . aspirin EC  81 mg Oral Daily  . cholecalciferol  5,000 Units Oral Daily  . dexamethasone (DECADRON) injection  6 mg Intravenous Q24H  . donepezil  10 mg Oral QHS  . enoxaparin (LOVENOX) injection  1 mg/kg Subcutaneous Q12H  . finasteride  5 mg Oral Daily  . insulin aspart  0-9 Units Subcutaneous TID WC  . insulin glargine  10 Units Subcutaneous Daily  . QUEtiapine  50 mg Oral BID  . simvastatin  40 mg Oral QHS  . thiamine injection  100 mg Intravenous Daily  . zinc sulfate  220 mg Oral Daily   Continuous Infusions: . feeding supplement (OSMOLITE 1.2 CAL) 65 mL/hr at 06/08/19 0700  . remdesivir 100 mg in NS 100 mL 100 mg (06/07/19 1529)     LOS: 2 days   The patient is critically ill with multiple organ systems failure and requires high complexity decision making for assessment and support, frequent evaluation and titration of therapies, application of advanced monitoring technologies and extensive interpretation of multiple databases. Critical Care Time devoted to patient care services described in this note  Time spent: 40 minutes     Arron Mcnaught, Roselind MessierURTIS J, MD [facility] Pager [phone]  If 7PM-7AM, please contact  night-coverage [email or web address] Password [email or web address] 06/08/2019, 9:03 AM

## 2019-06-09 ENCOUNTER — Inpatient Hospital Stay (HOSPITAL_COMMUNITY): Payer: Medicare Other

## 2019-06-09 DIAGNOSIS — R791 Abnormal coagulation profile: Secondary | ICD-10-CM

## 2019-06-09 DIAGNOSIS — U071 COVID-19: Secondary | ICD-10-CM

## 2019-06-09 LAB — CBC WITH DIFFERENTIAL/PLATELET
Abs Immature Granulocytes: 0.14 10*3/uL — ABNORMAL HIGH (ref 0.00–0.07)
Basophils Absolute: 0 10*3/uL (ref 0.0–0.1)
Basophils Relative: 0 %
Eosinophils Absolute: 0 10*3/uL (ref 0.0–0.5)
Eosinophils Relative: 0 %
HCT: 44.9 % (ref 39.0–52.0)
Hemoglobin: 14.3 g/dL (ref 13.0–17.0)
Immature Granulocytes: 2 %
Lymphocytes Relative: 7 %
Lymphs Abs: 0.7 10*3/uL (ref 0.7–4.0)
MCH: 29.7 pg (ref 26.0–34.0)
MCHC: 31.8 g/dL (ref 30.0–36.0)
MCV: 93.3 fL (ref 80.0–100.0)
Monocytes Absolute: 0.5 10*3/uL (ref 0.1–1.0)
Monocytes Relative: 6 %
Neutro Abs: 7.8 10*3/uL — ABNORMAL HIGH (ref 1.7–7.7)
Neutrophils Relative %: 85 %
Platelets: 232 10*3/uL (ref 150–400)
RBC: 4.81 MIL/uL (ref 4.22–5.81)
RDW: 13.2 % (ref 11.5–15.5)
WBC: 9.2 10*3/uL (ref 4.0–10.5)
nRBC: 0 % (ref 0.0–0.2)

## 2019-06-09 LAB — COMPREHENSIVE METABOLIC PANEL
ALT: 23 U/L (ref 0–44)
AST: 41 U/L (ref 15–41)
Albumin: 3.1 g/dL — ABNORMAL LOW (ref 3.5–5.0)
Alkaline Phosphatase: 103 U/L (ref 38–126)
Anion gap: 16 — ABNORMAL HIGH (ref 5–15)
BUN: 29 mg/dL — ABNORMAL HIGH (ref 8–23)
CO2: 22 mmol/L (ref 22–32)
Calcium: 9.1 mg/dL (ref 8.9–10.3)
Chloride: 107 mmol/L (ref 98–111)
Creatinine, Ser: 0.97 mg/dL (ref 0.61–1.24)
GFR calc Af Amer: >60 mL/min (ref >60)
GFR calc non Af Amer: >60 mL/min (ref >60)
Glucose, Bld: 373 mg/dL — ABNORMAL HIGH (ref 70–99)
Potassium: 4.2 mmol/L (ref 3.5–5.1)
Sodium: 145 mmol/L (ref 135–145)
Total Bilirubin: 1.1 mg/dL (ref 0.3–1.2)
Total Protein: 7.1 g/dL (ref 6.5–8.1)

## 2019-06-09 LAB — MAGNESIUM: Magnesium: 2.4 mg/dL (ref 1.7–2.4)

## 2019-06-09 LAB — GLUCOSE, CAPILLARY
Glucose-Capillary: 347 mg/dL — ABNORMAL HIGH (ref 70–99)
Glucose-Capillary: 298 mg/dL — ABNORMAL HIGH (ref 70–99)
Glucose-Capillary: 288 mg/dL — ABNORMAL HIGH (ref 70–99)
Glucose-Capillary: 301 mg/dL — ABNORMAL HIGH (ref 70–99)

## 2019-06-09 LAB — PHOSPHORUS: Phosphorus: 2.7 mg/dL (ref 2.5–4.6)

## 2019-06-09 LAB — D-DIMER, QUANTITATIVE: D-Dimer, Quant: >20 ug/mL-FEU — ABNORMAL HIGH (ref 0.00–0.50)

## 2019-06-09 LAB — FERRITIN: Ferritin: 655 ng/mL — ABNORMAL HIGH (ref 24–336)

## 2019-06-09 LAB — C-REACTIVE PROTEIN: CRP: 8.3 mg/dL — ABNORMAL HIGH (ref <1.0)

## 2019-06-09 MED ORDER — INSULIN ASPART 100 UNIT/ML ~~LOC~~ SOLN
5.0000 [IU] | Freq: Four times a day (QID) | SUBCUTANEOUS | Status: DC
  Administered 2019-06-09 – 2019-06-10 (×4): 5 [IU] via SUBCUTANEOUS

## 2019-06-09 MED ORDER — INSULIN GLARGINE 100 UNIT/ML ~~LOC~~ SOLN
20.0000 [IU] | Freq: Every day | SUBCUTANEOUS | Status: DC
  Administered 2019-06-10: 20 [IU] via SUBCUTANEOUS
  Filled 2019-06-09: qty 0.2

## 2019-06-09 MED ORDER — PHENOL 1.4 % MT LIQD
1.0000 | OROMUCOSAL | Status: DC | PRN
  Filled 2019-06-09: qty 177

## 2019-06-09 MED ORDER — BUTAMBEN-TETRACAINE-BENZOCAINE 2-2-14 % EX AERO: 1.0000 | INHALATION_SPRAY | Freq: Three times a day (TID) | CUTANEOUS | Status: DC | PRN

## 2019-06-09 NOTE — Progress Notes (Signed)
Venous duplex lower ext  has been completed. Refer to [facility] under chart review to view preliminary results. Results given to Dr. Sherral Hammers.  06/09/2019  2:46 PM Ethan Davis, [name]

## 2019-06-09 NOTE — Progress Notes (Signed)
PROGRESS NOTE    Ethan Davis  KZL:[id number] DOB: [birth year] DOA: 06/16/2019 PCP: Salley Scarlet, MD   Brief Narrative:  85 y.o. BM PMHx  Advanced Dementia, HLD, HTN, diabetes type 2 uncontrolled with complication, cararacts, BPH.   Presenting w/ known COVID Dx from outpt testing performed on 05/31/19 after pt presented w/ hypotension and dizziness, ppossible sinusitis and UTI. Started on Levaquin at that time.  On 06/01/19 pt presented to ED w/ fever and increasing AMS. Found to be COVID + at that time. Prior COVID test was still pending. Pt was given several liters of IVF and sent home w/ family at that time. No evidence of respiratory decline at that time. Pt was contacted on 06/02/19 for treatement of COVID w/ bamlanivimab infusion at [facility] but no call back from family was reported.   Per reports pt has had very little to eat since the time of his dx due to nausea and vomiting. Family has been giving hime Pedialyte and reported an O2 saturation this am of 83%.    Subjective: 12/31 last 24 hours afebrile A/O x1 (does not know where, when, why).  States throat is sore.   Assessment & Plan:   Active Problems:   Type 2 diabetes with nephropathy (HCC)   Essential hypertension, benign   Hyperlipidemia   Protein-calorie malnutrition (HCC)   Acute respiratory failure (HCC)   COVID-19 virus infection   Dementia without behavioral disturbance (HCC)   Pneumonia due to COVID-19 virus   Acute respiratory failure with hypoxia (HCC)   Elevated d-dimer   Essential hypertension   HLD (hyperlipidemia)   Diabetes mellitus type 2, uncontrolled, with complications (HCC)   Diabetic neuropathy (HCC)   Covid pneumonia/acute respiratory failure with hypoxia COVID-19 Labs  Recent Labs    06/07/19 0315 06/08/19 0005 06/09/19 0145  DDIMER >20.00* >20.00* >20.00*  FERRITIN 509* 546* 655*  CRP 17.3* 16.5* 8.3*    Lab Results  Component Value Date   SARSCOV2NAA POSITIVE (A) 2019-06-16     SARSCOV2NAA POSITIVE (A) 06/01/2019   SARSCOV2NAA DETECTED (A) 05/31/2019  -Decadron 6 mg daily -Remdesivir per pharmacy protocol -12/29 spoke with Iris (daughter) counseled her that the use of Actemra is off label and lacks randomized control trial data for this medication.  Counseled that improvement in patient's has been anecdotal in nature.  She agreed to allow [place] to use Actemra. -12/30 Actemra x1   Elevated D-dimer -10/29 CTA PE protocol negative for PE -10/30 bilateral lower extremity Dopplers pending.  Continue on treatment dose Lovenox  Acute DVT RIGHT leg -Acute DVT right leg see results below -Continue treatment dose Lovenox  Essential HTN -Lisinopril 2.5 mg daily -Hydralazine PRN  HLD -12/16 LDL= 79 -12/29 increase Zocor 40 mg daily  Diabetes type 2 uncontrolled with complication/diabetic neuropathy -12/16 hemoglobin A1c= 12.5 -12/31 increase Lantus 20 units daily  -12/31 NovoLog 5 units QID -12/30 moderate SSI  Protein calorie malnutrition  -12/29 CorTrak will be placed today. -Tube feeds per nutrition recommendations  Altered mental status@@@ -Per Iris (daughter) patient normally able to perform ADLs with help. Bad short term memory. Will follow commands    DVT prophylaxis: Lovenox treatment dose Code Status: Full Family Communication: 12/31 spoke with [name] (daughter) explained plan of care answered all questions Disposition Plan: TBD   Consultants:    Procedures/Significant Events:  12/29 CTA PE protocol; negative for PE 12/31 bilateral lower extremity Doppler;Right:-Acute DVT right common femoral vein, and right proximal profunda vein. Left: There is no  evidence of deep vein thrombosis in the lower extremity.   I have personally reviewed and interpreted all radiology studies and my findings are as above.  VENTILATOR SETTINGS: HFNC 12/31 Flow; 8 L/min SPO2; 92%    Cultures 12/28 SARS coronavirus positive 12/28 influenza A/B  negative    Antimicrobials: Anti-infectives (From admission, onward)   Start     Dose/Rate Stop   06/07/19 1600  remdesivir 100 mg in sodium chloride 0.9 % 100 mL IVPB  Status:  Discontinued     100 mg 200 mL/hr over 30 Minutes 06/07/19 0017   06/07/19 1600  remdesivir 100 mg in sodium chloride 0.9 % 100 mL IVPB     100 mg 200 mL/hr over 30 Minutes 06/11/19 0959   06/07/19 1000  remdesivir 100 mg in sodium chloride 0.9 % 100 mL IVPB  Status:  Discontinued     100 mg 200 mL/hr over 30 Minutes 06/07/19 0015   06/07/19 0030  remdesivir 200 mg in sodium chloride 0.9% 250 mL IVPB     200 mg 580 mL/hr over 30 Minutes 06/07/19 0214   05/27/2019 1800  remdesivir 200 mg in sodium chloride 0.9% 250 mL IVPB  Status:  Discontinued     200 mg 580 mL/hr over 30 Minutes 06/07/19 0017       Devices   LINES / TUBES:      Continuous Infusions: . feeding supplement (OSMOLITE 1.2 CAL) 45 mL/hr at 06/08/19 1800  . remdesivir 100 mg in NS 100 mL 100 mg (06/09/19 0827)     Objective: Vitals:   06/08/19 2000 06/09/19 0000 06/09/19 0400 06/09/19 0742  BP: 140/80 (!) 164/83 (!) 158/82 (!) 147/76  Pulse: 90 100 97 98  Resp: 18 (!) 28 20 20   Temp:  98.8 F (37.1 C)  97.7 F (36.5 C)  TempSrc:  Axillary  Axillary  SpO2: 94% 91% 94% 92%  Weight:      Height:        Intake/Output Summary (Last 24 hours) at 06/09/2019 0850 Last data filed at 06/09/2019 0400 Gross per 24 hour  Intake 100 ml  Output 645 ml  Net -545 ml   Filed Weights   06/07/19 0004 06/08/19 0500  Weight: 77.7 kg 74 kg   Physical Exam:  General: A/O x1 (does not know where, when, why) sore throat, positive acute respiratory distress Eyes: negative scleral hemorrhage, negative anisocoria, negative icterus ENT: Negative Runny nose, negative gingival bleeding, poor dentition Neck:  Negative scars, masses, torticollis, lymphadenopathy, JVD Lungs: Clear to auscultation bilaterally without wheezes or  crackles Cardiovascular: Regular rate and rhythm without murmur gallop or rub normal S1 and S2 Abdomen: negative abdominal pain, nondistended, positive soft, bowel sounds, no rebound, no ascites, no appreciable mass Extremities: No significant cyanosis, clubbing, or edema bilateral lower extremities Skin: Negative rashes, lesions, ulcers Psychiatric: Unable to assess secondary to AMS Central nervous system: Moves all extremities spontaneously does not follow  Physical Exam:  .     Data Reviewed: Care during the described time interval was provided by me .  I have reviewed this patient's available data, including medical history, events of note, physical examination, and all test results as part of my evaluation.   CBC: Recent Labs  Lab 05/19/2019 1347 06/07/19 0315 06/08/19 0005 06/09/19 0145  WBC 7.0 6.3 11.2* 9.2  NEUTROABS 5.5  --  10.1* 7.8*  HGB 15.2 14.7 14.3 14.3  HCT 48.6 45.8 45.2 44.9  MCV 94.0 92.2 93.8 93.3  PLT  186 185 191 232   Basic Metabolic Panel: Recent Labs  Lab 06-19-2019 1347 06/07/19 0315 06/08/19 0005 06/09/19 0145  NA 141 142 144 145  K 4.3 4.6 4.2 4.2  CL 101 106 106 107  CO2 GLUCOSE 245* 248* 228* 373*  BUN 21 18 24* 29*  CREATININE 1.13 0.89 0.99 0.97  CALCIUM 8.9 8.5* 8.8* 9.1  MG 2.4 2.4 2.3 2.4  PHOS  --  3.0 2.2* 2.7   GFR: Estimated Creatinine Clearance: 53 mL/min (by C-G formula based on SCr of 0.97 mg/dL). Liver Function Tests: Recent Labs  Lab 06-19-19 1347 06/07/19 0315 06/08/19 0005 06/09/19 0145  AST 35 31 34 41  ALT ALKPHOS 92 89 95 103  BILITOT 1.1 1.0 1.1 1.1  PROT 7.7 7.2 7.6 7.1  ALBUMIN 3.1* 3.1* 3.1* 3.1*   No results for input(s): LIPASE, AMYLASE in the last 168 hours. No results for input(s): AMMONIA in the last 168 hours. Coagulation Profile: No results for input(s): INR, PROTIME in the last 168 hours. Cardiac Enzymes: No results for input(s): CKTOTAL, CKMB, CKMBINDEX, TROPONINI  in the last 168 hours. BNP (last 3 results) No results for input(s): PROBNP in the last 8760 hours. HbA1C: Recent Labs    06/07/19 0315  HGBA1C 12.4*   CBG: Recent Labs  Lab 06/08/19 1211 06/08/19 1606 06/08/19 1958 06/09/19 0420 06/09/19 0740  GLUCAP 193* 158* 210* 347* 298*   Lipid Profile: Recent Labs    06/07/19 0315  CHOL 146  HDL 58  LDLCALC 69  TRIG 95  CHOLHDL 2.5   Thyroid Function Tests: No results for input(s): TSH, T4TOTAL, FREET4, T3FREE, THYROIDAB in the last 72 hours. Anemia Panel: Recent Labs    06/08/19 0005 06/09/19 0145  FERRITIN 546* 655*   Urine analysis:    Component Value Date/Time   COLORURINE YELLOW (A) 06/01/2019 1412   APPEARANCEUR CLOUDY (A) 06/01/2019 1412   LABSPEC 1.030 06/01/2019 1412   PHURINE 5.0 06/01/2019 1412   GLUCOSEU >=500 (A) 06/01/2019 1412   HGBUR SMALL (A) 06/01/2019 1412   BILIRUBINUR NEGATIVE 06/01/2019 1412   KETONESUR 5 (A) 06/01/2019 1412   PROTEINUR 100 (A) 06/01/2019 1412   NITRITE NEGATIVE 06/01/2019 1412   LEUKOCYTESUR NEGATIVE 06/01/2019 1412   Sepsis Labs: (procalcitonin:4,lacticidven:4)  ) Recent Results (from the past 240 hour(s))  SARS-COV-2 RNA,(COVID-19) QUAL NAAT     Status: Abnormal   Collection Time: 05/31/19 11:19 AM   Specimen: Respiratory  Result Value Ref Range Status   SARS CoV2 RNA DETECTED (A) NOT DETECT Final    Comment: . A Detected result is considered a positive test result for COVID-19.  This indicates that RNA from SARS-CoV-2 (formerly 2019-nCoV) was detected, and the patient is infected with the virus and presumed to be contagious. If requested by public health authority, specimen will be sent for additional testing. . Please review the "Fact Sheets" and FDA authorized labeling available for health care providers and patients using the following  websites: https://www.[email or web address]/home/[email or web address]/fact- sheet.html https://www.[email or web address]/home/[email or web address]/Patients/ [facility]/fact-sheet.html . This test has been authorized by the FDA under an  Emergency Use Authorization (EUA) for use by authorized laboratories. . Due to the current public health emergency, [facility] is receiving a high volume of samples from a wide variety of swabs and media for COVID-19 testing. In order to serve patients during this public health crisis, samples from appropri ate clinical sources are  being tested. Negative test results derived from specimens  received in non-commercially manufactured viral collection and transport media, or in media and sample collection kits not yet authorized by FDA for COVID-19 testing should be cautiously evaluated and the patient potentially subjected to extra precautions such as additional clinical monitoring, including collection of an additional specimen. . Methodology:  Nucleic Acid Amplification Test (NAAT) includes RT-PCR or TMA . Additional information about COVID-19 can be found at the [employer] website: [email or web address].   Respiratory Panel by RT PCR (Flu A&B, Covid) - Nasopharyngeal Swab     Status: Abnormal   Collection Time: 06/01/19  1:53 PM   Specimen: Nasopharyngeal Swab  Result Value Ref Range Status   SARS Coronavirus 2 by RT PCR POSITIVE (A) NEGATIVE Final    Comment: CRITICAL RESULT CALLED TO, READ BACK BY AND VERIFIED WITH: JENNIFER WHITLEY 06/01/19 1454 KLW (NOTE) SARS-CoV-2 target nucleic acids are DETECTED. SARS-CoV-2 RNA is generally detectable in upper respiratory specimens  during the acute phase of infection. Positive results are indicative of the presence of the identified virus, but do not rule out bacterial infection or co-infection with other pathogens not detected by the test. Clinical correlation with patient history  and other diagnostic information is necessary to determine patient infection status. The expected result is Negative. Fact Sheet for Patients:  https://www.moore.com/ Fact Sheet for Healthcare Providers: https://www.young.biz/ This test is not yet approved or cleared by the [place] FDA and  has been authorized for detection and/or diagnosis of SARS-CoV-2 by FDA under an Emergency Use Authorization (EUA).  This EUA will remain in effect (meaning this test can be  used) for the duration of  the COVID-19 declaration under Section 564(b)(1) of the Act, 21 U.S.C. section 360bbb-3(b)(1), unless the authorization is terminated or revoked sooner.    Influenza A by PCR NEGATIVE NEGATIVE Final   Influenza B by PCR NEGATIVE NEGATIVE Final    Comment: (NOTE) The Xpert Xpress SARS-CoV-2/FLU/RSV assay is intended as an aid in  the diagnosis of influenza from Nasopharyngeal swab specimens and  should not be used as a sole basis for treatment. Nasal washings and  aspirates are unacceptable for Xpert Xpress SARS-CoV-2/FLU/RSV  testing. Fact Sheet for Patients: https://www.moore.com/ Fact Sheet for Healthcare Providers: https://www.young.biz/ This test is not yet approved or cleared by the [place] FDA and  has been authorized for detection and/or diagnosis of SARS-CoV-2 by  FDA under an Emergency Use Authorization (EUA). This EUA will remain  in effect (meaning this test can be used) for the duration of the  Covid-19 declaration under Section 564(b)(1) of the Act, 21  U.S.C. section 360bbb-3(b)(1), unless the authorization is  terminated or revoked. Performed at [facility], [address], [place], [place] [address]   Urine culture     Status: None   Collection Time: 06/01/19  2:10 PM   Specimen: Urine, Random  Result Value Ref Range Status   Specimen Description   Final    URINE,  RANDOM Performed at [facility], [address], [place], [place] [address]    Special Requests   Final    NONE Performed at [facility], [address] [place], [place] [address]    Culture   Final    NO GROWTH Performed at [facility], [address] [place], [place] [address]    Report Status 06/03/2019 FINAL  Final  Respiratory Panel by RT PCR (Flu A&B, Covid) - Nasopharyngeal Swab     Status: Abnormal   Collection Time: 07-03-2019  9:30 PM   Specimen: Nasopharyngeal Swab  Result Value Ref Range Status   SARS Coronavirus 2 by RT PCR POSITIVE (A) NEGATIVE Final    Comment: RESULT CALLED TO, READ BACK BY AND VERIFIED WITH: MYRICK,B. AT 2219 ON 05/17/2019 BY EVA (NOTE) SARS-CoV-2 target nucleic acids are DETECTED. SARS-CoV-2 RNA is generally detectable in upper respiratory specimens  during the acute phase of infection. Positive results are indicative of the presence of the identified virus, but do not rule out bacterial infection or co-infection with other pathogens not detected by the test. Clinical correlation with patient history and other diagnostic information is necessary to determine patient infection status. The expected result is Negative. Fact Sheet for Patients:  https://www.moore.com/ Fact Sheet for Healthcare Providers: https://www.young.biz/ This test is not yet approved or cleared by the [place] FDA and  has been authorized for detection and/or diagnosis of SARS-CoV-2 by FDA under an Emergency Use Authorization (EUA).  This EUA will remain in effect (meaning this test can be used ) for the duration of  the COVID-19 declaration under Section 564(b)(1) of the Act, 21 U.S.C. section 360bbb-3(b)(1), unless the authorization is terminated or revoked sooner.    Influenza A by PCR NEGATIVE NEGATIVE Final   Influenza B by PCR NEGATIVE NEGATIVE Final    Comment: (NOTE) The Xpert Xpress  SARS-CoV-2/FLU/RSV assay is intended as an aid in  the diagnosis of influenza from Nasopharyngeal swab specimens and  should not be used as a sole basis for treatment. Nasal washings and  aspirates are unacceptable for Xpert Xpress SARS-CoV-2/FLU/RSV  testing. Fact Sheet for Patients: https://www.moore.com/ Fact Sheet for Healthcare Providers: https://www.young.biz/ This test is not yet approved or cleared by the [place] FDA and  has been authorized for detection and/or diagnosis of SARS-CoV-2 by  FDA under an Emergency Use Authorization (EUA). This EUA will remain  in effect (meaning this test can be used) for the duration of the  Covid-19 declaration under Section 564(b)(1) of the Act, 21  U.S.C. section 360bbb-3(b)(1), unless the authorization is  terminated or revoked. Performed at [facility], [address], [place], [place] [address]          Radiology Studies: CT ANGIO CHEST PE W OR WO CONTRAST  Result Date: 06/07/2019 CLINICAL DATA:  Concern for PE.  Shortness of breath. EXAM: CT ANGIOGRAPHY CHEST WITH CONTRAST TECHNIQUE: Multidetector CT imaging of the chest was performed using the standard protocol during bolus administration of intravenous contrast. Multiplanar CT image reconstructions and MIPs were obtained to evaluate the vascular anatomy. CONTRAST:  80mL OMNIPAQUE IOHEXOL 350 MG/ML SOLN COMPARISON:  None. FINDINGS: Cardiovascular: Evaluation for pulmonary emboli is limited by extensive respiratory motion artifact.Given this limitation, no large centrally located pulmonary embolism was detected. Detection of smaller pulmonary emboli is severely limited. Main pulmonary artery is not significantly dilated. Aortic calcifications are noted. There is no dissection. No significant aneurysm. Heart size is normal. Coronary artery calcifications are noted. There is no significant pericardial effusion. Mediastinum/Nodes: --No mediastinal or  hilar lymphadenopathy. --No axillary lymphadenopathy. --No supraclavicular lymphadenopathy. --Normal thyroid gland. --The esophagus is unremarkable Lungs/Pleura: There are asymmetric ground-glass airspace opacities involving the right upper, right middle, and right lower lobes. The left lower lobe is involved to a lesser degree. There is some right lung volume loss. There is no pneumothorax. No large pleural effusion. The trachea is unremarkable. There are mild emphysematous changes bilaterally. There is a calcified pleural based plaque at the right lung base. Upper Abdomen: No acute  abnormality. The enteric tube terminates within the gastric body. Musculoskeletal: No chest wall abnormality. No acute or significant osseous findings. Review of the MIP images confirms the above findings. IMPRESSION: 1. Evaluation for pulmonary emboli is significantly limited as detailed above. Given this limitation, no PE was identified. 2. Asymmetric ground-glass airspace opacities primarily involving the right lung. Findings are consistent with the patient's reported history of viral pneumonia. Asymmetric pulmonary edema could have a similar appearance in the appropriate clinical setting. Aortic Atherosclerosis (ICD10-I70.0) and Emphysema (ICD10-J43.9). Electronically Signed   By: Katherine Mantle M.D.   On: 06/07/2019 18:21   DG Abd Portable 1V  Result Date: 06/09/2019 CLINICAL DATA:  Nasogastric tube advancement EXAM: PORTABLE ABDOMEN - 1 VIEW COMPARISON:  Earlier today FINDINGS: Advanced nasogastric tube with tip and side-port over the gastric fundus. Low volume chest with infiltrates on both sides. Limited visualization of bowel without dilatation. IMPRESSION: Advanced nasogastric tube with tip and side-port in good position over the stomach. Electronically Signed   By: Marnee Spring M.D.   On: 06/09/2019 06:01   DG Abd Portable 1V  Result Date: 06/09/2019 CLINICAL DATA:  NG tube placement EXAM: PORTABLE ABDOMEN -  1 VIEW COMPARISON:  06/07/2019 FINDINGS: Interstitial and ground-glass opacities in the mid to lower lung zones. Esophageal tube tip overlies the proximal stomach. Side port appears positioned near the GE junction. Upper gas pattern is unremarkable IMPRESSION: Esophageal tube tip overlies the proximal stomach, side-port appears to be at the level of GE junction, consider further advancement for more optimal positioning Electronically Signed   By: Jasmine Pang M.D.   On: 06/09/2019 03:39   DG Abd Portable 1V  Result Date: 06/07/2019 CLINICAL DATA:  Feeding tube placement. EXAM: PORTABLE ABDOMEN - 1 VIEW COMPARISON:  Chest x-ray from same day. FINDINGS: New feeding tube with tip in the gastric fundus. The bowel gas pattern is normal. No radio-opaque calculi or other significant radiographic abnormality are seen. No acute osseous abnormality. Unchanged bibasilar pulmonary infiltrates. IMPRESSION: Feeding tube tip in the gastric fundus. Electronically Signed   By: Obie Dredge M.D.   On: 06/07/2019 15:37        Scheduled Meds: . vitamin C  500 mg Oral Daily  . aspirin EC  81 mg Oral Daily  . cholecalciferol  5,000 Units Oral Daily  . dexamethasone (DECADRON) injection  6 mg Intravenous Q24H  . donepezil  10 mg Oral QHS  . enoxaparin (LOVENOX) injection  1 mg/kg Subcutaneous Q12H  . finasteride  5 mg Oral Daily  . insulin aspart  0-15 Units Subcutaneous Q4H  . insulin glargine  15 Units Subcutaneous Daily  . lisinopril  2.5 mg Oral Daily  . LORazepam  1-2 mg Intravenous Once  . QUEtiapine  50 mg Oral BID  . simvastatin  40 mg Oral QHS  . thiamine injection  100 mg Intravenous Daily  . zinc sulfate  220 mg Oral Daily   Continuous Infusions: . feeding supplement (OSMOLITE 1.2 CAL) 45 mL/hr at 06/08/19 1800  . remdesivir 100 mg in NS 100 mL 100 mg (06/09/19 0827)     LOS: 3 days   The patient is critically ill with multiple organ systems failure and requires high complexity decision  making for assessment and support, frequent evaluation and titration of therapies, application of advanced monitoring technologies and extensive interpretation of multiple databases. Critical Care Time devoted to patient care services described in this note  Time spent: 40 minutes     Cumi Sanagustin  J, MD [facility] Pager [phone]  If 7PM-7AM, please contact night-coverage [email or web address] Password [email or web address] 06/09/2019, 8:50 AM

## 2019-06-09 NOTE — Progress Notes (Signed)
Full linen change and bath x 2 this shift. Repositioned every hr as pt likes to move around in bed.

## 2019-06-09 NOTE — Progress Notes (Signed)
PT PULLED OUT NG TUBE- STOPPED TF AND REPLACED NG TUBE WHICH IS IN LEFT NARE. MADE MD AWARE AND ASKED FOR IMAGING TO VERIFY PLACEMENT BEFORE RESUMING TF. WILL CONT TO MONITOR.

## 2019-06-09 NOTE — Evaluation (Signed)
Physical Therapy Evaluation Patient Details Name: Ethan Davis MRN: [id number] DOB: [birth year] Today's Date: 06/09/2019   History of Present Illness  85 y.o. BM PMHx  Advanced Dementia, HLD, HTN, diabetes type 2 uncontrolled with complication, cararacts, BPH. COVID Dx from outpt testing performed on 05/31/19 after pt presented w/ hypotension and dizziness, ppossible sinusitis and UTI. Admitted to [facility] on 05/23/2019.   Clinical Impression  Pt was able to assist in his mobility to EOB with multimodal cues and tolerated sitting for ~8 mins EOB with min to min guard assist on 10L O2 [place].  O2 sats were better seated than supine going up from 86% in supine to 100% in sitting EOB with DOE 2/4 on 10 L HFNC.  PT did not attempt standing as pt was pushing to return to supine.  He is appropriate for SNF level therapies at discharge, however, per OT's report family prefers to take him home.  PT to follow acutely for deficits listed below.    Follow Up Recommendations SNF(family will likely take him home)    Equipment Recommendations  Hospital bed;3in1 (PT);Wheelchair (measurements PT);Wheelchair cushion (measurements PT)    Recommendations for Other Services   NA    Precautions / Restrictions Precautions Precautions: Fall;Other (comment) Precaution Comments: bil wrist restraints, mittens, NG tube feeds      Mobility  Bed Mobility Overal bed mobility: Needs Assistance Bed Mobility: Supine to Sit;Sit to Supine     Supine to sit: Mod assist;HOB elevated Sit to supine: Mod assist;HOB elevated   General bed mobility comments: One person mod assist to help initiate movement of legs over the EOB and then to come from behind and assist pt at his trunk to come to fully upright sitting.  Pt responded well to multimodal cues and once EOB could be min to min guard assist to sit.  Mod assist to help lift both legs back up into bed as pt was putting his trunk down into bed on his own.    Transfers                 General transfer comment: NT pt climbing back into bed before therapist could attempt.          Balance Overall balance assessment: Needs assistance Sitting-balance support: Feet supported;No upper extremity supported Sitting balance-Leahy Scale: Poor Sitting balance - Comments: initially mod assist, but once situated with feet on the ground he could be min to min guard assist.  Postural control: Posterior lean                                   Pertinent Vitals/Pain Pain Assessment: Faces Faces Pain Scale: No hurt    Home Living Family/patient expects to be discharged to:: Private residence Living Arrangements: Children Available Help at Discharge: Available 24 hours/day Type of Home: House Home Access: Ramped entrance     Home Layout: One level Home Equipment: Walker - 2 wheels;Shower seat Additional Comments: pt unable to give PLOF; home set up    Prior Function Level of Independence: Independent;Needs assistance   Gait / Transfers Assistance Needed: Did not use an AD  ADL's / Homemaking Assistance Needed: pt comleted his ADL with the only exception of his daughter washing his back  Comments: information from daughter via phone conversation with OT    Extremity/Trunk Assessment   Upper Extremity Assessment Upper Extremity Assessment: Defer to OT evaluation  Lower Extremity Assessment Lower Extremity Assessment: RLE deficits/detail;LLE deficits/detail RLE Deficits / Details: Generalized weakness, visible muscle wasting, ROM grossly intact, stregth is weak throughout without obvious signs of asymmetry.      Cervical / Trunk Assessment Cervical / Trunk Assessment: Kyphotic  Communication   Communication: HOH  Cognition Arousal/Alertness: Lethargic Behavior During Therapy: Restless;Impulsive;Flat affect Overall Cognitive Status: Impaired/Different from baseline Area of Impairment:  Orientation;Attention;Memory;Following commands;Safety/judgement;Awareness;Problem solving                 Orientation Level: Disoriented to;Place;Time;Situation(told me his name) Current Attention Level: Focused Memory: Decreased short-term memory Following Commands: Follows one step commands inconsistently Safety/Judgement: Decreased awareness of safety;Decreased awareness of deficits Awareness: Intellectual Problem Solving: Slow processing;Decreased initiation;Difficulty sequencing;Requires verbal cues;Requires tactile cues General Comments: Lethargic, eyes closed most of the session, restless, picking at lines and tubes when I released his hands from restraints.  Mumbling, not very clear when he did speak which was rare.  Responded to verbal visual and tactile cues.       General Comments General comments (skin integrity, edema, etc.): Pt's O2 sats were upper 80s at rest on 10 L HFNC.  They were nearly 100% in sitting EOB.  He did have some audible DOE with mobility which seemed to settle once seated.         Assessment/Plan    PT Assessment Patient needs continued PT services  PT Problem List Decreased strength;Decreased activity tolerance;Decreased balance;Decreased mobility;Decreased cognition;Decreased knowledge of use of DME;Decreased safety awareness;Decreased knowledge of precautions;Cardiopulmonary status limiting activity       PT Treatment Interventions DME instruction;Gait training;Functional mobility training;Therapeutic activities;Therapeutic exercise;Balance training;Neuromuscular re-education;Cognitive remediation;Patient/family education    PT Goals (Current goals can be found in the Care Plan section)  Acute Rehab PT Goals Patient Stated Goal: per family to return home PT Goal Formulation: Patient unable to participate in goal setting Time For Goal Achievement: 06/23/19 Potential to Achieve Goals: Good    Frequency Min 2X/week           AM-PAC PT  "6 Clicks" Mobility  Outcome Measure Help needed turning from your back to your side while in a flat bed without using bedrails?: A Lot Help needed moving from lying on your back to sitting on the side of a flat bed without using bedrails?: A Lot Help needed moving to and from a bed to a chair (including a wheelchair)?: A Lot Help needed standing up from a chair using your arms (e.g., wheelchair or bedside chair)?: Total Help needed to walk in hospital room?: Total Help needed climbing 3-5 steps with a railing? : Total 6 Click Score: 9    End of Session Equipment Utilized During Treatment: Oxygen Activity Tolerance: Patient limited by fatigue Patient left: in bed;with call bell/phone within reach;with bed alarm set Nurse Communication: Mobility status PT Visit Diagnosis: Muscle weakness (generalized) (M62.81);Difficulty in walking, not elsewhere classified (R26.2)    Time: 0240-9735 PT Time Calculation (min) (ACUTE ONLY): 16 min   Charges:       Corinna Capra, PT, DPT  Acute Rehabilitation [phone] pager #[phone] office  @ [facility]: [phone]   PT Evaluation $PT Eval Moderate Complexity: 1 Mod         06/09/2019, 5:33 PM

## 2019-06-09 NOTE — Progress Notes (Signed)
Advanced ng tube per imaging finding- msg to md to re-order for proper placement of ng tube

## 2019-06-09 NOTE — Progress Notes (Signed)
TF RESUMED

## 2019-06-09 NOTE — Progress Notes (Signed)
   06/09/19 1000  Family/Significant Other Communication  Family/Significant Other Update Called;Updated

## 2019-06-10 DIAGNOSIS — J1282 Pneumonia due to coronavirus disease 2019: Secondary | ICD-10-CM

## 2019-06-10 LAB — GLUCOSE, CAPILLARY
Glucose-Capillary: 367 mg/dL — ABNORMAL HIGH (ref 70–99)
Glucose-Capillary: 422 mg/dL — ABNORMAL HIGH (ref 70–99)
Glucose-Capillary: 329 mg/dL — ABNORMAL HIGH (ref 70–99)
Glucose-Capillary: 267 mg/dL — ABNORMAL HIGH (ref 70–99)
Glucose-Capillary: 300 mg/dL — ABNORMAL HIGH (ref 70–99)
Glucose-Capillary: 284 mg/dL — ABNORMAL HIGH (ref 70–99)

## 2019-06-10 LAB — COMPREHENSIVE METABOLIC PANEL
ALT: 35 U/L (ref 0–44)
AST: 62 U/L — ABNORMAL HIGH (ref 15–41)
Albumin: 2.8 g/dL — ABNORMAL LOW (ref 3.5–5.0)
Alkaline Phosphatase: 118 U/L (ref 38–126)
Anion gap: 10 (ref 5–15)
BUN: 30 mg/dL — ABNORMAL HIGH (ref 8–23)
CO2: 27 mmol/L (ref 22–32)
Calcium: 9.2 mg/dL (ref 8.9–10.3)
Chloride: 110 mmol/L (ref 98–111)
Creatinine, Ser: 1.05 mg/dL (ref 0.61–1.24)
GFR calc Af Amer: >60 mL/min (ref >60)
GFR calc non Af Amer: >60 mL/min (ref >60)
Glucose, Bld: 341 mg/dL — ABNORMAL HIGH (ref 70–99)
Potassium: 4.7 mmol/L (ref 3.5–5.1)
Sodium: 147 mmol/L — ABNORMAL HIGH (ref 135–145)
Total Bilirubin: 1 mg/dL (ref 0.3–1.2)
Total Protein: 6.5 g/dL (ref 6.5–8.1)

## 2019-06-10 LAB — CBC WITH DIFFERENTIAL/PLATELET
Abs Immature Granulocytes: 0.2 10*3/uL — ABNORMAL HIGH (ref 0.00–0.07)
Basophils Absolute: 0.1 10*3/uL (ref 0.0–0.1)
Basophils Relative: 1 %
Eosinophils Absolute: 0.1 10*3/uL (ref 0.0–0.5)
Eosinophils Relative: 1 %
HCT: 48.7 % (ref 39.0–52.0)
Hemoglobin: 15.8 g/dL (ref 13.0–17.0)
Immature Granulocytes: 2 %
Lymphocytes Relative: 5 %
Lymphs Abs: 0.6 10*3/uL — ABNORMAL LOW (ref 0.7–4.0)
MCH: 30.2 pg (ref 26.0–34.0)
MCHC: 32.4 g/dL (ref 30.0–36.0)
MCV: 92.9 fL (ref 80.0–100.0)
Monocytes Absolute: 0.2 10*3/uL (ref 0.1–1.0)
Monocytes Relative: 2 %
Neutro Abs: 9.4 10*3/uL — ABNORMAL HIGH (ref 1.7–7.7)
Neutrophils Relative %: 89 %
Platelets: 141 10*3/uL — ABNORMAL LOW (ref 150–400)
RBC: 5.24 MIL/uL (ref 4.22–5.81)
RDW: 13.5 % (ref 11.5–15.5)
WBC: 10.5 10*3/uL (ref 4.0–10.5)
nRBC: 0.5 % — ABNORMAL HIGH (ref 0.0–0.2)

## 2019-06-10 LAB — FERRITIN: Ferritin: 674 ng/mL — ABNORMAL HIGH (ref 24–336)

## 2019-06-10 LAB — MAGNESIUM: Magnesium: 2.2 mg/dL (ref 1.7–2.4)

## 2019-06-10 LAB — C-REACTIVE PROTEIN: CRP: 3.3 mg/dL — ABNORMAL HIGH (ref <1.0)

## 2019-06-10 LAB — D-DIMER, QUANTITATIVE: D-Dimer, Quant: >20 ug/mL-FEU — ABNORMAL HIGH (ref 0.00–0.50)

## 2019-06-10 LAB — PHOSPHORUS: Phosphorus: 3 mg/dL (ref 2.5–4.6)

## 2019-06-10 MED ORDER — INSULIN ASPART 100 UNIT/ML ~~LOC~~ SOLN
0.0000 [IU] | SUBCUTANEOUS | Status: DC
  Administered 2019-06-10 – 2019-06-11 (×4): 11 [IU] via SUBCUTANEOUS
  Administered 2019-06-11: 08:00:00 15 [IU] via SUBCUTANEOUS
  Administered 2019-06-11 (×2): 7 [IU] via SUBCUTANEOUS
  Administered 2019-06-11 (×2): 11 [IU] via SUBCUTANEOUS
  Administered 2019-06-12: 18:00:00 4 [IU] via SUBCUTANEOUS
  Administered 2019-06-12 (×2): 7 [IU] via SUBCUTANEOUS
  Administered 2019-06-12: 3 [IU] via SUBCUTANEOUS
  Administered 2019-06-12 (×2): 4 [IU] via SUBCUTANEOUS
  Administered 2019-06-12: 04:00:00 11 [IU] via SUBCUTANEOUS
  Administered 2019-06-13: 04:00:00 4 [IU] via SUBCUTANEOUS

## 2019-06-10 MED ORDER — INSULIN GLARGINE 100 UNIT/ML ~~LOC~~ SOLN
25.0000 [IU] | Freq: Every day | SUBCUTANEOUS | Status: DC
  Administered 2019-06-11: 11:00:00 25 [IU] via SUBCUTANEOUS
  Filled 2019-06-10: qty 0.25

## 2019-06-10 MED ORDER — INSULIN ASPART 100 UNIT/ML ~~LOC~~ SOLN
10.0000 [IU] | Freq: Four times a day (QID) | SUBCUTANEOUS | Status: DC
  Administered 2019-06-10 – 2019-06-11 (×5): 10 [IU] via SUBCUTANEOUS

## 2019-06-10 NOTE — Progress Notes (Signed)
PROGRESS NOTE    Ethan Davis  ZOX:[id number] DOB: 06-21-1926 DOA: 05/29/2019 PCP: Salley Scarlet, MD   Brief Narrative:  86 y.o. BM PMHx  Advanced Dementia, HLD, HTN, diabetes type 2 uncontrolled with complication, cararacts, BPH.   Presenting w/ known COVID Dx from outpt testing performed on 05/31/19 after pt presented w/ hypotension and dizziness, ppossible sinusitis and UTI. Started on Levaquin at that time.  On 06/01/19 pt presented to ED w/ fever and increasing AMS. Found to be COVID + at that time. Prior COVID test was still pending. Pt was given several liters of IVF and sent home w/ family at that time. No evidence of respiratory decline at that time. Pt was contacted on 06/02/19 for treatement of COVID w/ bamlanivimab infusion at [facility] but no call back from family was reported.   Per reports pt has had very little to eat since the time of his dx due to nausea and vomiting. Family has been giving hime Pedialyte and reported an O2 saturation this am of 83%.    Subjective: 1/1 last 24 hours afebrile A/O x1 (does not know where, when, why)   Assessment & Plan:   Active Problems:   Type 2 diabetes with nephropathy (HCC)   Essential hypertension, benign   Hyperlipidemia   Protein-calorie malnutrition (HCC)   Acute respiratory failure (HCC)   COVID-19 virus infection   Dementia without behavioral disturbance (HCC)   Pneumonia due to COVID-19 virus   Acute respiratory failure with hypoxia (HCC)   Elevated d-dimer   Essential hypertension   HLD (hyperlipidemia)   Diabetes mellitus type 2, uncontrolled, with complications (HCC)   Diabetic neuropathy (HCC)   Covid pneumonia/acute respiratory failure with hypoxia COVID-19 Labs  Recent Labs    06/08/19 0005 06/09/19 0145  DDIMER >20.00* >20.00*  FERRITIN 546* 655*  CRP 16.5* 8.3*    Lab Results  Component Value Date   SARSCOV2NAA POSITIVE (A) 05/28/2019   SARSCOV2NAA POSITIVE (A) 06/01/2019   SARSCOV2NAA DETECTED  (A) 05/31/2019  -Decadron 6 mg daily -Remdesivir per pharmacy protocol -12/29 spoke with Iris (daughter) counseled her that the use of Actemra is off label and lacks randomized control trial data for this medication.  Counseled that improvement in patient's has been anecdotal in nature.  She agreed to allow [place] to use Actemra. -12/30 Actemra x1  -1/1 titrate O2 to maintain SPO2> 88%; this patient's case primarily mouth breather at NRB to HFNC (may allow permissive hypoxia as long as patient's mental status remains unchanged)   Elevated D-dimer -10/29 CTA PE protocol negative for PE -10/30 bilateral lower extremity Dopplers pending.  Continue on treatment dose Lovenox  Acute DVT RIGHT leg -Acute DVT right leg see results below -Continue treatment dose Lovenox -1/1 consulted NCM Vance Peper.  What type of DOAC will patient's insurance-?  Essential HTN -Lisinopril 2.5 mg daily -Hydralazine PRN  HLD -12/16 LDL= 79 -12/29 increase Zocor 40 mg daily  Diabetes type 2 uncontrolled with complication/diabetic neuropathy -12/16 hemoglobin A1c= 12.5 -1/1 increase Lantus 25 units daily  -1/1 increase NovoLog 5 units QID  -1/1 resistant SSI I  Protein calorie malnutrition  -12/29 CorTrak will be placed today. -Tube feeds per nutrition recommendations  Altered mental status@@@ -Per Iris (daughter) patient normally able to perform ADLs with help. Bad short term memory. Will follow commands    DVT prophylaxis: Lovenox treatment dose Code Status: Full Family Communication: 12/31 spoke with Iris (daughter) explained plan of care answered all questions Disposition Plan: TBD  Consultants:    Procedures/Significant Events:  12/29 CTA PE protocol; negative for PE 12/31 bilateral lower extremity Doppler;Right:-Acute DVT right common femoral vein, and right proximal profunda vein. Left: There is no evidence of deep vein thrombosis in the lower extremity.   I have personally reviewed and  interpreted all radiology studies and my findings are as above.  VENTILATOR SETTINGS: HFNC 1/1 Flow; 15 L/min SPO2; 92%    Cultures 12/28 SARS coronavirus positive 12/28 influenza A/B negative    Antimicrobials: Anti-infectives (From admission, onward)   Start     Dose/Rate Stop   06/07/19 1600  remdesivir 100 mg in sodium chloride 0.9 % 100 mL IVPB  Status:  Discontinued     100 mg 200 mL/hr over 30 Minutes 06/07/19 0017   06/07/19 1600  remdesivir 100 mg in sodium chloride 0.9 % 100 mL IVPB     100 mg 200 mL/hr over 30 Minutes 06/11/19 0959   06/07/19 1000  remdesivir 100 mg in sodium chloride 0.9 % 100 mL IVPB  Status:  Discontinued     100 mg 200 mL/hr over 30 Minutes 06/07/19 0015   06/07/19 0030  remdesivir 200 mg in sodium chloride 0.9% 250 mL IVPB     200 mg 580 mL/hr over 30 Minutes 06/07/19 0214   06/09/2019 1800  remdesivir 200 mg in sodium chloride 0.9% 250 mL IVPB  Status:  Discontinued     200 mg 580 mL/hr over 30 Minutes 06/07/19 0017       Devices   LINES / TUBES:      Continuous Infusions: . feeding supplement (OSMOLITE 1.2 CAL) 65 mL/hr at 06/10/19 0354     Objective: Vitals:   06/09/19 2000 06/10/19 0000 06/10/19 0400 06/10/19 0800  BP: (!) 150/95 (!) 152/86 (!) 156/100 (!) 141/85  Pulse: 95 100 (!) 108 (!) 102  Resp: 20 18 (!) 25 (!) 23  Temp: 98.5 F (36.9 C) 98.7 F (37.1 C) 99.2 F (37.3 C) 98.7 F (37.1 C)  TempSrc: Axillary Axillary Axillary Axillary  SpO2: (!) 89% 94% 93% 92%  Weight:      Height:        Intake/Output Summary (Last 24 hours) at 06/10/2019 0915 Last data filed at 06/10/2019 0231 Gross per 24 hour  Intake --  Output 900 ml  Net -900 ml   Filed Weights   06/07/19 0004 06/08/19 0500  Weight: 77.7 kg 74 kg   Physical Exam:  General: A/O x1 (does not know where, when, why) positive acute respiratory distress Eyes: negative scleral hemorrhage, negative anisocoria, negative icterus ENT: Negative Runny nose,  negative gingival bleeding, Neck:  Negative scars, masses, torticollis, lymphadenopathy, JVD Lungs: Tachypneic clear to auscultation bilaterally without wheezes or crackles Cardiovascular: Tachycardic without murmur gallop or rub normal S1 and S2 Abdomen: negative abdominal pain, nondistended, positive soft, bowel sounds, no rebound, no ascites, no appreciable mass Extremities: No significant cyanosis, clubbing, or edema bilateral lower extremities Skin: Negative rashes, lesions, ulcers Psychiatric: Unable to assess secondary to AMS Central nervous system:  Cranial nerves II through XII intact, tongue/uvula midline, all extremities muscle strength 5/5, sensation intact throughout, unable to assess for other does not follow commands  .     Data Reviewed: Care during the described time interval was provided by me .  I have reviewed this patient's available data, including medical history, events of note, physical examination, and all test results as part of my evaluation.   CBC: Recent Labs  Lab 05/24/2019 1347  06/07/19 0315 06/08/19 0005 06/09/19 0145  WBC 7.0 6.3 11.2* 9.2  NEUTROABS 5.5  --  10.1* 7.8*  HGB 15.2 14.7 14.3 14.3  HCT 48.6 45.8 45.2 44.9  MCV 94.0 92.2 93.8 93.3  PLT 186 185 191 232   Basic Metabolic Panel: Recent Labs  Lab 06-19-2019 1347 06/07/19 0315 06/08/19 0005 06/09/19 0145  NA 141 142 144 145  K 4.3 4.6 4.2 4.2  CL 101 106 106 107  CO2 27 23 24 22   GLUCOSE 245* 248* 228* 373*  BUN 21 18 24* 29*  CREATININE 1.13 0.89 0.99 0.97  CALCIUM 8.9 8.5* 8.8* 9.1  MG 2.4 2.4 2.3 2.4  PHOS  --  3.0 2.2* 2.7   GFR: Estimated Creatinine Clearance: 53 mL/min (by C-G formula based on SCr of 0.97 mg/dL). Liver Function Tests: Recent Labs  Lab 06-19-19 1347 06/07/19 0315 06/08/19 0005 06/09/19 0145  AST 35 31 34 41  ALT 20 19 19 23   ALKPHOS 92 89 95 103  BILITOT 1.1 1.0 1.1 1.1  PROT 7.7 7.2 7.6 7.1  ALBUMIN 3.1* 3.1* 3.1* 3.1*   No results for  input(s): LIPASE, AMYLASE in the last 168 hours. No results for input(s): AMMONIA in the last 168 hours. Coagulation Profile: No results for input(s): INR, PROTIME in the last 168 hours. Cardiac Enzymes: No results for input(s): CKTOTAL, CKMB, CKMBINDEX, TROPONINI in the last 168 hours. BNP (last 3 results) No results for input(s): PROBNP in the last 8760 hours. HbA1C: No results for input(s): HGBA1C in the last 72 hours. CBG: Recent Labs  Lab 06/09/19 1129 06/09/19 1628 06/10/19 0213 06/10/19 0429 06/10/19 0714  GLUCAP 288* 301* 367* 422* 329*   Lipid Profile: No results for input(s): CHOL, HDL, LDLCALC, TRIG, CHOLHDL, LDLDIRECT in the last 72 hours. Thyroid Function Tests: No results for input(s): TSH, T4TOTAL, FREET4, T3FREE, THYROIDAB in the last 72 hours. Anemia Panel: Recent Labs    06/08/19 0005 06/09/19 0145  FERRITIN 546* 655*   Urine analysis:    Component Value Date/Time   COLORURINE YELLOW (A) 06/01/2019 1412   APPEARANCEUR CLOUDY (A) 06/01/2019 1412   LABSPEC 1.030 06/01/2019 1412   PHURINE 5.0 06/01/2019 1412   GLUCOSEU >=500 (A) 06/01/2019 1412   HGBUR SMALL (A) 06/01/2019 1412   BILIRUBINUR NEGATIVE 06/01/2019 1412   KETONESUR 5 (A) 06/01/2019 1412   PROTEINUR 100 (A) 06/01/2019 1412   NITRITE NEGATIVE 06/01/2019 1412   LEUKOCYTESUR NEGATIVE 06/01/2019 1412   Sepsis Labs: @LABRCNTIP (procalcitonin:4,lacticidven:4)  ) Recent Results (from the past 240 hour(s))  SARS-COV-2 RNA,(COVID-19) QUAL NAAT     Status: Abnormal   Collection Time: 05/31/19 11:19 AM   Specimen: Respiratory  Result Value Ref Range Status   SARS CoV2 RNA DETECTED (A) NOT DETECT Final    Comment: . A Detected result is considered a positive test result for COVID-19.  This indicates that RNA from SARS-CoV-2 (formerly 2019-nCoV) was detected, and the patient is infected with the virus and presumed to be contagious. If requested by public health authority, specimen will be  sent for additional testing. . Please review the "Fact Sheets" and FDA authorized labeling available for health care providers and patients using the following websites: https://www.[facility].com/home/Covid-19/HCP/[facility]/fact- sheet.html https://www.[facility].com/home/Covid-19/Patients/ [facility]/fact-sheet.html . This test has been authorized by the FDA under an  Emergency Use Authorization (EUA) for use by authorized laboratories. . Due to the current public health emergency, [facility] is receiving a high volume of samples from a wide variety of swabs and media  for COVID-19 testing. In order to serve patients during this public health crisis, samples from appropri ate clinical sources are  being tested. Negative test results derived from specimens received in non-commercially manufactured viral collection and transport media, or in media and sample collection kits not yet authorized by FDA for COVID-19 testing should be cautiously evaluated and the patient potentially subjected to extra precautions such as additional clinical monitoring, including collection of an additional specimen. . Methodology:  Nucleic Acid Amplification Test (NAAT) includes RT-PCR or TMA . Additional information about COVID-19 can be found at the [employer] website: www.[email or web address].   Respiratory Panel by RT PCR (Flu A&B, Covid) - Nasopharyngeal Swab     Status: Abnormal   Collection Time: 06/01/19  1:53 PM   Specimen: Nasopharyngeal Swab  Result Value Ref Range Status   SARS Coronavirus 2 by RT PCR POSITIVE (A) NEGATIVE Final    Comment: CRITICAL RESULT CALLED TO, READ BACK BY AND VERIFIED WITH: JENNIFER WHITLEY 06/01/19 1454 KLW (NOTE) SARS-CoV-2 target nucleic acids are DETECTED. SARS-CoV-2 RNA is generally detectable in upper respiratory specimens  during the acute phase of infection. Positive results are indicative of the presence of the  identified virus, but do not rule out bacterial infection or co-infection with other pathogens not detected by the test. Clinical correlation with patient history and other diagnostic information is necessary to determine patient infection status. The expected result is Negative. Fact Sheet for Patients:  https://www.moore.com/ Fact Sheet for Healthcare Providers: https://www.young.biz/ This test is not yet approved or cleared by the [place] FDA and  has been authorized for detection and/or diagnosis of SARS-CoV-2 by FDA under an Emergency Use Authorization (EUA).  This EUA will remain in effect (meaning this test can be  used) for the duration of  the COVID-19 declaration under Section 564(b)(1) of the Act, 21 U.S.C. section 360bbb-3(b)(1), unless the authorization is terminated or revoked sooner.    Influenza A by PCR NEGATIVE NEGATIVE Final   Influenza B by PCR NEGATIVE NEGATIVE Final    Comment: (NOTE) The Xpert Xpress SARS-CoV-2/FLU/RSV assay is intended as an aid in  the diagnosis of influenza from Nasopharyngeal swab specimens and  should not be used as a sole basis for treatment. Nasal washings and  aspirates are unacceptable for Xpert Xpress SARS-CoV-2/FLU/RSV  testing. Fact Sheet for Patients: https://www.moore.com/ Fact Sheet for Healthcare Providers: https://www.young.biz/ This test is not yet approved or cleared by the [place] FDA and  has been authorized for detection and/or diagnosis of SARS-CoV-2 by  FDA under an Emergency Use Authorization (EUA). This EUA will remain  in effect (meaning this test can be used) for the duration of the  Covid-19 declaration under Section 564(b)(1) of the Act, 21  U.S.C. section 360bbb-3(b)(1), unless the authorization is  terminated or revoked. Performed at [facility], [address], [place], [place] [address]   Urine culture      Status: None   Collection Time: 06/01/19  2:10 PM   Specimen: Urine, Random  Result Value Ref Range Status   Specimen Description   Final    URINE, RANDOM Performed at [facility], [address], [place], [place] [address]    Special Requests   Final    NONE Performed at [facility], [address] [place], [place] [address]    Culture   Final    NO GROWTH Performed at [facility], [address], [place], [place] [address]    Report  Status 06/03/2019 FINAL  Final  Respiratory Panel by RT PCR (Flu A&B, Covid) - Nasopharyngeal Swab     Status: Abnormal   Collection Time: 2019/06/18  9:30 PM   Specimen: Nasopharyngeal Swab  Result Value Ref Range Status   SARS Coronavirus 2 by RT PCR POSITIVE (A) NEGATIVE Final    Comment: RESULT CALLED TO, READ BACK BY AND VERIFIED WITH: MYRICK,B. AT 2219 ON 06-18-19 BY EVA (NOTE) SARS-CoV-2 target nucleic acids are DETECTED. SARS-CoV-2 RNA is generally detectable in upper respiratory specimens  during the acute phase of infection. Positive results are indicative of the presence of the identified virus, but do not rule out bacterial infection or co-infection with other pathogens not detected by the test. Clinical correlation with patient history and other diagnostic information is necessary to determine patient infection status. The expected result is Negative. Fact Sheet for Patients:  PinkCheek.be Fact Sheet for Healthcare Providers: GravelBags.it This test is not yet approved or cleared by the [place] FDA and  has been authorized for detection and/or diagnosis of SARS-CoV-2 by FDA under an Emergency Use Authorization (EUA).  This EUA will remain in effect (meaning this test can be used ) for the duration of  the COVID-19 declaration under Section 564(b)(1) of the Act, 21 U.S.C. section 360bbb-3(b)(1), unless the authorization is terminated or  revoked sooner.    Influenza A by PCR NEGATIVE NEGATIVE Final   Influenza B by PCR NEGATIVE NEGATIVE Final    Comment: (NOTE) The Xpert Xpress SARS-CoV-2/FLU/RSV assay is intended as an aid in  the diagnosis of influenza from Nasopharyngeal swab specimens and  should not be used as a sole basis for treatment. Nasal washings and  aspirates are unacceptable for Xpert Xpress SARS-CoV-2/FLU/RSV  testing. Fact Sheet for Patients: PinkCheek.be Fact Sheet for Healthcare Providers: GravelBags.it This test is not yet approved or cleared by the [place] FDA and  has been authorized for detection and/or diagnosis of SARS-CoV-2 by  FDA under an Emergency Use Authorization (EUA). This EUA will remain  in effect (meaning this test can be used) for the duration of the  Covid-19 declaration under Section 564(b)(1) of the Act, 21  U.S.C. section 360bbb-3(b)(1), unless the authorization is  terminated or revoked. Performed at [facility], [address], [place], [place] [address]          Radiology Studies: DG Abd Portable 1V  Result Date: 06/09/2019 CLINICAL DATA:  Nasogastric tube advancement EXAM: PORTABLE ABDOMEN - 1 VIEW COMPARISON:  Earlier today FINDINGS: Advanced nasogastric tube with tip and side-port over the gastric fundus. Low volume chest with infiltrates on both sides. Limited visualization of bowel without dilatation. IMPRESSION: Advanced nasogastric tube with tip and side-port in good position over the stomach. Electronically Signed   By: Monte Fantasia M.D.   On: 06/09/2019 06:01   DG Abd Portable 1V  Result Date: 06/09/2019 CLINICAL DATA:  NG tube placement EXAM: PORTABLE ABDOMEN - 1 VIEW COMPARISON:  06/07/2019 FINDINGS: Interstitial and ground-glass opacities in the mid to lower lung zones. Esophageal tube tip overlies the proximal stomach. Side port appears positioned near the GE junction. Upper gas pattern is  unremarkable IMPRESSION: Esophageal tube tip overlies the proximal stomach, side-port appears to be at the level of GE junction, consider further advancement for more optimal positioning Electronically Signed   By: Donavan Foil M.D.   On: 06/09/2019 03:39   VAS [place] LOWER EXTREMITY VENOUS (DVT)  Result Date: 06/09/2019  Lower Venous Study Other Indications: Covid  positive with elevated d-dimer. Comparison Study: No priors. Performing Technologist: Marilynne Halsted RDMS, RVT  Examination Guidelines: A complete evaluation includes B-mode imaging, spectral Doppler, color Doppler, and power Doppler as needed of all accessible portions of each vessel. Bilateral testing is considered an integral part of a complete examination. Limited examinations for reoccurring indications may be performed as noted.  +---------+---------------+---------+-----------+----------+-------------------+ RIGHT    CompressibilityPhasicitySpontaneityPropertiesThrombus Aging      +---------+---------------+---------+-----------+----------+-------------------+ CFV      None           No       No                                       +---------+---------------+---------+-----------+----------+-------------------+ SFJ      None                                                             +---------+---------------+---------+-----------+----------+-------------------+ FV Prox  Full                                                             +---------+---------------+---------+-----------+----------+-------------------+ FV Mid   Full                                                             +---------+---------------+---------+-----------+----------+-------------------+ FV DistalFull                                                             +---------+---------------+---------+-----------+----------+-------------------+ PFV      None           No       No                                        +---------+---------------+---------+-----------+----------+-------------------+ POP      Full           Yes      Yes                                      +---------+---------------+---------+-----------+----------+-------------------+ PTV      Full                                                             +---------+---------------+---------+-----------+----------+-------------------+ PERO  Full                                                             +---------+---------------+---------+-----------+----------+-------------------+ REIV                                                  appears patent,                                                           poorly visualized                                                         due to bowel gas    +---------+---------------+---------+-----------+----------+-------------------+   +---------+---------------+---------+-----------+----------+--------------+ LEFT     CompressibilityPhasicitySpontaneityPropertiesThrombus Aging +---------+---------------+---------+-----------+----------+--------------+ CFV      Full           Yes      Yes                                 +---------+---------------+---------+-----------+----------+--------------+ SFJ      Full                                                        +---------+---------------+---------+-----------+----------+--------------+ FV Prox  Full                                                        +---------+---------------+---------+-----------+----------+--------------+ FV Mid   Full                                                        +---------+---------------+---------+-----------+----------+--------------+ FV DistalFull                                                        +---------+---------------+---------+-----------+----------+--------------+ PFV      Full                                                         +---------+---------------+---------+-----------+----------+--------------+  POP      Full           Yes      Yes                                 +---------+---------------+---------+-----------+----------+--------------+ PTV      Full                                                        +---------+---------------+---------+-----------+----------+--------------+ EIV                                                   appears patent +---------+---------------+---------+-----------+----------+--------------+     Summary: Right: Findings consistent with acute deep vein thrombosis involving the right common femoral vein, and right proximal profunda vein. Left: There is no evidence of deep vein thrombosis in the lower extremity.  *See table(s) above for measurements and observations. Electronically signed by Waverly Ferrarihristopher Dickson MD on 06/09/2019 at 3:10:01 PM.    Final         Scheduled Meds: . vitamin C  500 mg Oral Daily  . aspirin EC  81 mg Oral Daily  . cholecalciferol  5,000 Units Oral Daily  . dexamethasone (DECADRON) injection  6 mg Intravenous Q24H  . donepezil  10 mg Oral QHS  . enoxaparin (LOVENOX) injection  1 mg/kg Subcutaneous Q12H  . finasteride  5 mg Oral Daily  . insulin aspart  0-15 Units Subcutaneous Q4H  . insulin aspart  5 Units Subcutaneous QID  . insulin glargine  20 Units Subcutaneous Daily  . lisinopril  2.5 mg Oral Daily  . LORazepam  1-2 mg Intravenous Once  . QUEtiapine  50 mg Oral BID  . simvastatin  40 mg Oral QHS  . thiamine injection  100 mg Intravenous Daily  . zinc sulfate  220 mg Oral Daily   Continuous Infusions: . feeding supplement (OSMOLITE 1.2 CAL) 65 mL/hr at 06/10/19 0354     LOS: 4 days   The patient is critically ill with multiple organ systems failure and requires high complexity decision making for assessment and support, frequent evaluation and titration of therapies, application of advanced monitoring technologies and  extensive interpretation of multiple databases. Critical Care Time devoted to patient care services described in this note  Time spent: 40 minutes     Monica Zahler, Roselind MessierURTIS J, MD [facility] Pager [phone]  If 7PM-7AM, please contact night-coverage [email or web address] Password [email or web address] 06/10/2019, 9:15 AM

## 2019-06-10 NOTE — Plan of Care (Signed)
Increased O2 needs this shift. Pt is not able to prone at this time.   Problem: Education: Goal: Knowledge of risk factors and measures for prevention of condition will improve Outcome: Progressing   Problem: Coping: Goal: Psychosocial and spiritual needs will be supported Outcome: Progressing   Problem: Respiratory: Goal: Will maintain a patent airway Outcome: Progressing Goal: Complications related to the disease process, condition or treatment will be avoided or minimized Outcome: Progressing   Problem: Education: Goal: Knowledge of General Education information will improve Description: Including pain rating scale, medication(s)/side effects and non-pharmacologic comfort measures Outcome: Progressing   Problem: Health Behavior/Discharge Planning: Goal: Ability to manage health-related needs will improve Outcome: Progressing   Problem: Clinical Measurements: Goal: Ability to maintain clinical measurements within normal limits will improve Outcome: Progressing Goal: Will remain free from infection Outcome: Progressing Goal: Diagnostic test results will improve Outcome: Progressing Goal: Respiratory complications will improve Outcome: Progressing Goal: Cardiovascular complication will be avoided Outcome: Progressing   Problem: Activity: Goal: Risk for activity intolerance will decrease Outcome: Progressing   Problem: Nutrition: Goal: Adequate nutrition will be maintained Outcome: Progressing   Problem: Coping: Goal: Level of anxiety will decrease Outcome: Progressing   Problem: Elimination: Goal: Will not experience complications related to bowel motility Outcome: Progressing Goal: Will not experience complications related to urinary retention Outcome: Progressing   Problem: Pain Managment: Goal: General experience of comfort will improve Outcome: Progressing   Problem: Safety: Goal: Ability to remain free from injury will improve Outcome: Progressing    Problem: Skin Integrity: Goal: Risk for impaired skin integrity will decrease Outcome: Progressing

## 2019-06-10 NOTE — Progress Notes (Signed)
   06/10/19 1400  Family/Significant Other Communication  Family/Significant Other Update Called;Updated

## 2019-06-10 NOTE — Progress Notes (Signed)
MESSAGE TO MD reporting bg [id number]- advised that pt is now at goal with tf and could be the reason. Administered insulin per emar.

## 2019-06-10 NOTE — Progress Notes (Signed)
Pt tolerated medications this shift. Cooperative with care tonight. Received full linen change and bath, applied barrier cream to skin, new condom cath and performed mouth care. Pt turned several times this shift. No new issues or concerns to report at this time. Will cont to monitor.

## 2019-06-10 DEATH — deceased

## 2019-06-11 LAB — GLUCOSE, CAPILLARY
Glucose-Capillary: 299 mg/dL — ABNORMAL HIGH (ref 70–99)
Glucose-Capillary: 267 mg/dL — ABNORMAL HIGH (ref 70–99)
Glucose-Capillary: 287 mg/dL — ABNORMAL HIGH (ref 70–99)
Glucose-Capillary: 275 mg/dL — ABNORMAL HIGH (ref 70–99)
Glucose-Capillary: 205 mg/dL — ABNORMAL HIGH (ref 70–99)
Glucose-Capillary: 272 mg/dL — ABNORMAL HIGH (ref 70–99)
Glucose-Capillary: 270 mg/dL — ABNORMAL HIGH (ref 70–99)

## 2019-06-11 LAB — CBC WITH DIFFERENTIAL/PLATELET
Abs Immature Granulocytes: 0.26 10*3/uL — ABNORMAL HIGH (ref 0.00–0.07)
Basophils Absolute: 0.1 10*3/uL (ref 0.0–0.1)
Basophils Relative: 1 %
Eosinophils Absolute: 0 10*3/uL (ref 0.0–0.5)
Eosinophils Relative: 0 %
HCT: 47.4 % (ref 39.0–52.0)
Hemoglobin: 15.4 g/dL (ref 13.0–17.0)
Immature Granulocytes: 2 %
Lymphocytes Relative: 4 %
Lymphs Abs: 0.6 10*3/uL — ABNORMAL LOW (ref 0.7–4.0)
MCH: 30.4 pg (ref 26.0–34.0)
MCHC: 32.5 g/dL (ref 30.0–36.0)
MCV: 93.7 fL (ref 80.0–100.0)
Monocytes Absolute: 0.2 10*3/uL (ref 0.1–1.0)
Monocytes Relative: 2 %
Neutro Abs: 13.9 10*3/uL — ABNORMAL HIGH (ref 1.7–7.7)
Neutrophils Relative %: 91 %
Platelets: 119 10*3/uL — ABNORMAL LOW (ref 150–400)
RBC: 5.06 MIL/uL (ref 4.22–5.81)
RDW: 13.8 % (ref 11.5–15.5)
WBC: 15.1 10*3/uL — ABNORMAL HIGH (ref 4.0–10.5)
nRBC: 0.5 % — ABNORMAL HIGH (ref 0.0–0.2)

## 2019-06-11 LAB — COMPREHENSIVE METABOLIC PANEL
ALT: 44 U/L (ref 0–44)
AST: 77 U/L — ABNORMAL HIGH (ref 15–41)
Albumin: 2.8 g/dL — ABNORMAL LOW (ref 3.5–5.0)
Alkaline Phosphatase: 134 U/L — ABNORMAL HIGH (ref 38–126)
Anion gap: 11 (ref 5–15)
BUN: 44 mg/dL — ABNORMAL HIGH (ref 8–23)
CO2: 26 mmol/L (ref 22–32)
Calcium: 9.2 mg/dL (ref 8.9–10.3)
Chloride: 110 mmol/L (ref 98–111)
Creatinine, Ser: 1.16 mg/dL (ref 0.61–1.24)
GFR calc Af Amer: >60 mL/min (ref >60)
GFR calc non Af Amer: 55 mL/min — ABNORMAL LOW (ref >60)
Glucose, Bld: 322 mg/dL — ABNORMAL HIGH (ref 70–99)
Potassium: 5.2 mmol/L — ABNORMAL HIGH (ref 3.5–5.1)
Sodium: 147 mmol/L — ABNORMAL HIGH (ref 135–145)
Total Bilirubin: 0.9 mg/dL (ref 0.3–1.2)
Total Protein: 6.1 g/dL — ABNORMAL LOW (ref 6.5–8.1)

## 2019-06-11 LAB — D-DIMER, QUANTITATIVE: D-Dimer, Quant: >20 ug/mL-FEU — ABNORMAL HIGH (ref 0.00–0.50)

## 2019-06-11 LAB — MAGNESIUM: Magnesium: 2.3 mg/dL (ref 1.7–2.4)

## 2019-06-11 LAB — C-REACTIVE PROTEIN: CRP: 1.8 mg/dL — ABNORMAL HIGH (ref <1.0)

## 2019-06-11 LAB — FERRITIN: Ferritin: 566 ng/mL — ABNORMAL HIGH (ref 24–336)

## 2019-06-11 LAB — PHOSPHORUS: Phosphorus: 3.9 mg/dL (ref 2.5–4.6)

## 2019-06-11 MED ORDER — VITAMIN D 25 MCG (1000 UNIT) PO TABS
5000.0000 [IU] | ORAL_TABLET | Freq: Every day | ORAL | Status: DC
  Administered 2019-06-12: 10:00:00 5000 [IU]
  Filled 2019-06-11: qty 5

## 2019-06-11 MED ORDER — INSULIN GLARGINE 100 UNIT/ML ~~LOC~~ SOLN
30.0000 [IU] | Freq: Every day | SUBCUTANEOUS | Status: DC
  Administered 2019-06-12: 10:00:00 30 [IU] via SUBCUTANEOUS
  Filled 2019-06-11 (×2): qty 0.3

## 2019-06-11 MED ORDER — QUETIAPINE FUMARATE 25 MG PO TABS
50.0000 mg | ORAL_TABLET | Freq: Two times a day (BID) | ORAL | Status: DC
  Administered 2019-06-11 – 2019-06-12 (×3): 50 mg
  Filled 2019-06-11 (×3): qty 2

## 2019-06-11 MED ORDER — INSULIN ASPART 100 UNIT/ML ~~LOC~~ SOLN
15.0000 [IU] | Freq: Four times a day (QID) | SUBCUTANEOUS | Status: DC
  Administered 2019-06-11 – 2019-06-12 (×6): 15 [IU] via SUBCUTANEOUS

## 2019-06-11 MED ORDER — ACETAMINOPHEN 650 MG RE SUPP: 650.0000 mg | Freq: Four times a day (QID) | RECTAL | Status: DC | PRN

## 2019-06-11 MED ORDER — ACETAMINOPHEN 325 MG PO TABS: 650.0000 mg | ORAL_TABLET | Freq: Four times a day (QID) | ORAL | Status: DC | PRN

## 2019-06-11 MED ORDER — DONEPEZIL HCL 10 MG PO TABS
10.0000 mg | ORAL_TABLET | Freq: Every day | ORAL | Status: DC
  Administered 2019-06-11 – 2019-06-12 (×2): 10 mg
  Filled 2019-06-11 (×2): qty 1

## 2019-06-11 MED ORDER — ZINC SULFATE 220 (50 ZN) MG PO CAPS
220.0000 mg | ORAL_CAPSULE | Freq: Every day | ORAL | Status: DC
  Administered 2019-06-12: 10:00:00 220 mg
  Filled 2019-06-11: qty 1

## 2019-06-11 MED ORDER — ASCORBIC ACID 500 MG PO TABS
500.0000 mg | ORAL_TABLET | Freq: Every day | ORAL | Status: DC
  Administered 2019-06-12: 10:00:00 500 mg
  Filled 2019-06-11: qty 1

## 2019-06-11 MED ORDER — LISINOPRIL 2.5 MG PO TABS
2.5000 mg | ORAL_TABLET | Freq: Every day | ORAL | Status: DC
  Filled 2019-06-11: qty 1

## 2019-06-11 MED ORDER — SODIUM ZIRCONIUM CYCLOSILICATE 10 G PO PACK
10.0000 g | PACK | Freq: Three times a day (TID) | ORAL | Status: AC
  Administered 2019-06-11 (×3): 10 g via ORAL
  Filled 2019-06-11 (×4): qty 1

## 2019-06-11 MED ORDER — HALOPERIDOL LACTATE 5 MG/ML IJ SOLN: 1.0000 mg | Freq: Four times a day (QID) | INTRAMUSCULAR | Status: DC | PRN

## 2019-06-11 NOTE — Progress Notes (Signed)
Called daughter, Knute Neu, to give update. Left voice message for her to call back when available.

## 2019-06-11 NOTE — Progress Notes (Addendum)
PROGRESS NOTE    Ethan Davis  XBJ:[id number] DOB: [birth year] DOA: 05/12/2019 PCP: Salley Scarleturham, Kawanta F, MD   Brief Narrative:  86 y.o. BM PMHx  Advanced Dementia, HLD, HTN, diabetes type 2 uncontrolled with complication, cararacts, BPH.   Presenting w/ known COVID Dx from outpt testing performed on 05/31/19 after pt presented w/ hypotension and dizziness, ppossible sinusitis and UTI. Started on Levaquin at that time.  On 06/01/19 pt presented to ED w/ fever and increasing AMS. Found to be COVID + at that time. Prior COVID test was still pending. Pt was given several liters of IVF and sent home w/ family at that time. No evidence of respiratory decline at that time. Pt was contacted on 06/02/19 for [facility]ement of [facility] but no call back from family was reported.   Per reports pt has had very little to eat since the time of his dx due to nausea and vomiting. Family has been giving hime Pedialyte and reported an O2 saturation this am of 83%.    Subjective: 1/2 last 24 hours afebrile, today patient mumbling unintelligibly   Assessment & Plan:   Active Problems:   Type 2 diabetes with nephropathy (HCC)   Essential hypertension, benign   Hyperlipidemia   Protein-calorie malnutrition (HCC)   Acute respiratory failure (HCC)   COVID-19 virus infection   Dementia without behavioral disturbance (HCC)   Pneumonia due to COVID-19 virus   Acute respiratory failure with hypoxia (HCC)   Elevated d-dimer   Essential hypertension   HLD (hyperlipidemia)   Diabetes mellitus type 2, uncontrolled, with complications (HCC)   Diabetic neuropathy (HCC)   Covid pneumonia/acute respiratory failure with hypoxia COVID-19 Labs  Recent Labs    06/09/19 0145 06/10/19 0925 06/11/19 0133  DDIMER >20.00* >20.00* >20.00*  FERRITIN 655* 674* 566*  CRP 8.3* 3.3* 1.8*    Lab Results  Component Value Date   SARSCOV2NAA POSITIVE (A) 06/03/2019   SARSCOV2NAA POSITIVE (A)  06/01/2019   SARSCOV2NAA DETECTED (A) 05/31/2019  -Decadron 6 mg daily -Remdesivir per pharmacy protocol -12/29 spoke with Iris (daughter) counseled her that the use of Actemra is off label and lacks randomized control trial data for this medication.  Counseled that improvement in patient's has been anecdotal in nature.  She agreed to allow us to use Actemra. -12/30 Actemra x1  -1/1 titrate O2 to maintain SPO2> 88%; this patient's case primarily mouth breather at NRB to HFNC (may allow permissive hypoxia as long as patient's mental status remains unchanged)   Elevated D-dimer -12/29 CTA PE protocol negative for PE  Acute DVT RIGHT leg -Acute DVT right leg see results below -Continue treatment dose Lovenox   Essential HTN -Lisinopril 2.5 mg daily -Hydralazine PRN  HLD -12/16 LDL= 79 -12/29 increase Zocor 40 mg daily  Diabetes type 2 uncontrolled with complication/diabetic neuropathy -12/16 hemoglobin A1c= 12.5 -1/2 increase Lantus 30 units daily increase Lantus 25 units daily  -1/2 increase NovoLog 10 units QID increase NovoLog 5 units QID  -Resistant SSI   Protein calorie malnutrition  -12/29 CorTrak will be placed today. -Tube feeds per nutrition recommendations  Altered mental status@@@ -Per Iris (daughter) patient normally able to perform ADLs with help. Bad short term memory. Will follow commands   Hyper kalemia -Lokelma 10 g x 3 doses  Goals of care -1/1 consulted NCM Vance PeperSusan Brady.  What type of DOAC will patient's insurance-?    DVT prophylaxis: Lovenox treatment dose Code Status: Full Family Communication: 06/11/2019 spoke  with Iris (daughter) explained plan of care answered all questions Disposition Plan: TBD   Consultants:    Procedures/Significant Events:  12/29 CTA PE protocol; negative for PE 12/31 bilateral lower extremity Doppler;Right:-Acute DVT right common femoral vein, and right proximal profunda vein. Left: There is no evidence of deep vein  thrombosis in the lower extremity.   I have personally reviewed and interpreted all radiology studies and my findings are as above.  VENTILATOR SETTINGS:  NRB 1/2 Flow; 15 L/min SPO2; 96%    Cultures 12/28 SARS coronavirus positive 12/28 influenza A/B negative    Antimicrobials: Anti-infectives (From admission, onward)   Start     Dose/Rate Stop   06/07/19 1600  remdesivir 100 mg in sodium chloride 0.9 % 100 mL IVPB  Status:  Discontinued     100 mg 200 mL/hr over 30 Minutes 06/07/19 0017   06/07/19 1600  remdesivir 100 mg in sodium chloride 0.9 % 100 mL IVPB     100 mg 200 mL/hr over 30 Minutes 06/11/19 0959   06/07/19 1000  remdesivir 100 mg in sodium chloride 0.9 % 100 mL IVPB  Status:  Discontinued     100 mg 200 mL/hr over 30 Minutes 06/07/19 0015   06/07/19 0030  remdesivir 200 mg in sodium chloride 0.9% 250 mL IVPB     200 mg 580 mL/hr over 30 Minutes 06/07/19 0214   06/09/2019 1800  remdesivir 200 mg in sodium chloride 0.9% 250 mL IVPB  Status:  Discontinued     200 mg 580 mL/hr over 30 Minutes 06/07/19 0017       Devices   LINES / TUBES:      Continuous Infusions: . feeding supplement (OSMOLITE 1.2 CAL) 65 mL/hr at 06/11/19 0100     Objective: Vitals:   06/11/19 0000 06/11/19 0030 06/11/19 0400 06/11/19 0758  BP: (!) 96/56 (!) 103/50 117/71 124/77  Pulse: (!) 105 (!) 101 (!) 102 100  Resp: (!) 31 (!) 21 (!) 30 (!) 22  Temp: 98.6 F (37 C)  98 F (36.7 C) 97.7 F (36.5 C)  TempSrc: Axillary  Axillary Axillary  SpO2: 99% 96% 99% 100%  Weight:      Height:        Intake/Output Summary (Last 24 hours) at 06/11/2019 1059 Last data filed at 06/11/2019 0810 Gross per 24 hour  Intake 650 ml  Output 1000 ml  Net -350 ml   Filed Weights   06/07/19 0004 06/08/19 0500  Weight: 77.7 kg 74 kg   Physical Exam:  General: Mumbles unintelligibly, positive  acute respiratory distress, cachectic Eyes: negative scleral hemorrhage, negative anisocoria,  negative icterus ENT: Negative Runny nose, negative gingival bleeding, Neck:  Negative scars, masses, torticollis, lymphadenopathy, JVD Lungs: Clear to auscultation bilaterally without wheezes or crackles Cardiovascular: Regular rate and rhythm without murmur gallop or rub normal S1 and S2 Abdomen: negative abdominal pain, nondistended, positive soft, bowel sounds, no rebound, no ascites, no appreciable mass Extremities: No significant cyanosis, clubbing, or edema bilateral lower extremities Skin: Negative rashes, lesions, ulcers Psychiatric: Unable to assess secondary to AMS on dementia Central nervous system:  Cranial nerves II through XII intact, tongue/uvula midline, all extremities muscle strength 5/5, sensation intact throughout,   .     Data Reviewed: Care during the described time interval was provided by me .  I have reviewed this patient's available data, including medical history, events of note, physical examination, and all test results as part of my evaluation.   CBC:  Recent Labs  Lab 06-12-2019 1347 06/07/19 0315 06/08/19 0005 06/09/19 0145 06/10/19 0925 06/11/19 0133  WBC 7.0 6.3 11.2* 9.2 10.5 15.1*  NEUTROABS 5.5  --  10.1* 7.8* 9.4* 13.9*  HGB 15.2 14.7 14.3 14.3 15.8 15.4  HCT 48.6 45.8 45.2 44.9 48.7 47.4  MCV 94.0 92.2 93.8 93.3 92.9 93.7  PLT 186 185 191 232 141* 119*   Basic Metabolic Panel: Recent Labs  Lab 06/07/19 0315 06/08/19 0005 06/09/19 0145 06/10/19 0925 06/11/19 0133  NA 142 144 145 147* 147*  K 4.6 4.2 4.2 4.7 5.2*  CL 106 106 107 110 110  CO2 23 24 22 27 26   GLUCOSE 248* 228* 373* 341* 322*  BUN 18 24* 29* 30* 44*  CREATININE 0.89 0.99 0.97 1.05 1.16  CALCIUM 8.5* 8.8* 9.1 9.2 9.2  MG 2.4 2.3 2.4 2.2 2.3  PHOS 3.0 2.2* 2.7 3.0 3.9   GFR: Estimated Creatinine Clearance: 44.3 mL/min (by C-G formula based on SCr of 1.16 mg/dL). Liver Function Tests: Recent Labs  Lab 06/07/19 0315 06/08/19 0005 06/09/19 0145 06/10/19 0925  06/11/19 0133  AST 31 34 41 62* 77*  ALT 19 19 23  35 44  ALKPHOS 89 95 103 118 134*  BILITOT 1.0 1.1 1.1 1.0 0.9  PROT 7.2 7.6 7.1 6.5 6.1*  ALBUMIN 3.1* 3.1* 3.1* 2.8* 2.8*   No results for input(s): LIPASE, AMYLASE in the last 168 hours. No results for input(s): AMMONIA in the last 168 hours. Coagulation Profile: No results for input(s): INR, PROTIME in the last 168 hours. Cardiac Enzymes: No results for input(s): CKTOTAL, CKMB, CKMBINDEX, TROPONINI in the last 168 hours. BNP (last 3 results) No results for input(s): PROBNP in the last 8760 hours. HbA1C: No results for input(s): HGBA1C in the last 72 hours. CBG: Recent Labs  Lab 06/10/19 1238 06/10/19 1630 06/10/19 2141 06/11/19 0111 06/11/19 0338  GLUCAP 267* 300* 284* 299* 267*   Lipid Profile: No results for input(s): CHOL, HDL, LDLCALC, TRIG, CHOLHDL, LDLDIRECT in the last 72 hours. Thyroid Function Tests: No results for input(s): TSH, T4TOTAL, FREET4, T3FREE, THYROIDAB in the last 72 hours. Anemia Panel: Recent Labs    06/10/19 0925 06/11/19 0133  FERRITIN 674* 566*   Urine analysis:    Component Value Date/Time   COLORURINE YELLOW (A) 06/01/2019 1412   APPEARANCEUR CLOUDY (A) 06/01/2019 1412   LABSPEC 1.030 06/01/2019 1412   PHURINE 5.0 06/01/2019 1412   GLUCOSEU >=500 (A) 06/01/2019 1412   HGBUR SMALL (A) 06/01/2019 1412   BILIRUBINUR NEGATIVE 06/01/2019 1412   KETONESUR 5 (A) 06/01/2019 1412   PROTEINUR 100 (A) 06/01/2019 1412   NITRITE NEGATIVE 06/01/2019 1412   LEUKOCYTESUR NEGATIVE 06/01/2019 1412   Sepsis Labs: @LABRCNTIP (procalcitonin:4,lacticidven:4)  ) Recent Results (from the past 240 hour(s))  Respiratory Panel by RT PCR (Flu A&B, Covid) - Nasopharyngeal Swab     Status: Abnormal   Collection Time: 06/01/19  1:53 PM   Specimen: Nasopharyngeal Swab  Result Value Ref Range Status   SARS Coronavirus 2 by RT PCR POSITIVE (A) NEGATIVE Final    Comment: CRITICAL RESULT CALLED TO, READ BACK  BY AND VERIFIED WITH: JENNIFER WHITLEY 06/01/19 1454 KLW (NOTE) SARS-CoV-2 target nucleic acids are DETECTED. SARS-CoV-2 RNA is generally detectable in upper respiratory specimens  during the acute phase of infection. Positive results are indicative of the presence of the identified virus, but do not rule out bacterial infection or co-infection with other pathogens not detected by the test. Clinical correlation with  patient history and other diagnostic information is necessary to determine patient infection status. The expected result is Negative. Fact Sheet for Patients:  PinkCheek.be Fact Sheet for Healthcare Providers: GravelBags.it This test is not yet approved or cleared by the [place] FDA and  has been authorized for detection and/or diagnosis of SARS-CoV-2 by FDA under an Emergency Use Authorization (EUA).  This EUA will remain in effect (meaning this test can be  used) for the duration of  the COVID-19 declaration under Section 564(b)(1) of the Act, 21 U.S.C. section 360bbb-3(b)(1), unless the authorization is terminated or revoked sooner.    Influenza A by PCR NEGATIVE NEGATIVE Final   Influenza B by PCR NEGATIVE NEGATIVE Final    Comment: (NOTE) The Xpert Xpress SARS-CoV-2/FLU/RSV assay is intended as an aid in  the diagnosis of influenza from Nasopharyngeal swab specimens and  should not be used as a sole basis for treatment. Nasal washings and  aspirates are unacceptable for Xpert Xpress SARS-CoV-2/FLU/RSV  testing. Fact Sheet for Patients: PinkCheek.be Fact Sheet for Healthcare Providers: GravelBags.it This test is not yet approved or cleared by the [place] FDA and  has been authorized for detection and/or diagnosis of SARS-CoV-2 by  FDA under an Emergency Use Authorization (EUA). This EUA will remain  in effect (meaning this test can be  used) for the duration of the  Covid-19 declaration under Section 564(b)(1) of the Act, 21  U.S.C. section 360bbb-3(b)(1), unless the authorization is  terminated or revoked. Performed at [facility], [address], [place], [place] [address]   Urine culture     Status: None   Collection Time: 06/01/19  2:10 PM   Specimen: Urine, Random  Result Value Ref Range Status   Specimen Description   Final    URINE, RANDOM Performed at [facility], [address], [place], [place] [address]    Special Requests   Final    NONE Performed at [facility], [address] [place], [place] [address]    Culture   Final    NO GROWTH Performed at [facility], [place] [address] [place], [place] [address]    Report Status 06/03/2019 FINAL  Final  Respiratory Panel by RT PCR (Flu A&B, Covid) - Nasopharyngeal Swab     Status: Abnormal   Collection Time: 2019-06-23  9:30 PM   Specimen: Nasopharyngeal Swab  Result Value Ref Range Status   SARS Coronavirus 2 by RT PCR POSITIVE (A) NEGATIVE Final    Comment: RESULT CALLED TO, READ BACK BY AND VERIFIED WITH: MYRICK,B. AT 2219 ON 06-23-19 BY EVA (NOTE) SARS-CoV-2 target nucleic acids are DETECTED. SARS-CoV-2 RNA is generally detectable in upper respiratory specimens  during the acute phase of infection. Positive results are indicative of the presence of the identified virus, but do not rule out bacterial infection or co-infection with other pathogens not detected by the test. Clinical correlation with patient history and other diagnostic information is necessary to determine patient infection status. The expected result is Negative. Fact Sheet for Patients:  PinkCheek.be Fact Sheet for Healthcare Providers: GravelBags.it This test is not yet approved or cleared by the [place] FDA and  has been authorized for detection and/or diagnosis of SARS-CoV-2  by FDA under an Emergency Use Authorization (EUA).  This EUA will remain in effect (meaning this test can be used ) for the duration of  the COVID-19 declaration under Section 564(b)(1) of the Act, 21 U.S.C. section 360bbb-3(b)(1), unless the authorization  is terminated or revoked sooner.    Influenza A by PCR NEGATIVE NEGATIVE Final   Influenza B by PCR NEGATIVE NEGATIVE Final    Comment: (NOTE) The Xpert Xpress SARS-CoV-2/FLU/RSV assay is intended as an aid in  the diagnosis of influenza from Nasopharyngeal swab specimens and  should not be used as a sole basis for treatment. Nasal washings and  aspirates are unacceptable for Xpert Xpress SARS-CoV-2/FLU/RSV  testing. Fact Sheet for Patients: https://www.moore.com/ Fact Sheet for Healthcare Providers: https://www.young.biz/ This test is not yet approved or cleared by the [place] FDA and  has been authorized for detection and/or diagnosis of SARS-CoV-2 by  FDA under an Emergency Use Authorization (EUA). This EUA will remain  in effect (meaning this test can be used) for the duration of the  Covid-19 declaration under Section 564(b)(1) of the Act, 21  U.S.C. section 360bbb-3(b)(1), unless the authorization is  terminated or revoked. Performed at [facility], [address], [place], [place] [address]          Radiology Studies: VAS [place] LOWER EXTREMITY VENOUS (DVT)  Result Date: 06/09/2019  Lower Venous Study Other Indications: Covid positive with elevated d-dimer. Comparison Study: No priors. Performing Technologist: Marilynne Halsted RDMS, RVT  Examination Guidelines: A complete evaluation includes B-mode imaging, spectral Doppler, color Doppler, and power Doppler as needed of all accessible portions of each vessel. Bilateral testing is considered an integral part of a complete examination. Limited examinations for reoccurring indications may be performed as noted.   +---------+---------------+---------+-----------+----------+-------------------+ RIGHT    CompressibilityPhasicitySpontaneityPropertiesThrombus Aging      +---------+---------------+---------+-----------+----------+-------------------+ CFV      None           No       No                                       +---------+---------------+---------+-----------+----------+-------------------+ SFJ      None                                                             +---------+---------------+---------+-----------+----------+-------------------+ FV Prox  Full                                                             +---------+---------------+---------+-----------+----------+-------------------+ FV Mid   Full                                                             +---------+---------------+---------+-----------+----------+-------------------+ FV DistalFull                                                             +---------+---------------+---------+-----------+----------+-------------------+ PFV  None           No       No                                       +---------+---------------+---------+-----------+----------+-------------------+ POP      Full           Yes      Yes                                      +---------+---------------+---------+-----------+----------+-------------------+ PTV      Full                                                             +---------+---------------+---------+-----------+----------+-------------------+ PERO     Full                                                             +---------+---------------+---------+-----------+----------+-------------------+ REIV                                                  appears patent,                                                           poorly visualized                                                         due to bowel gas     +---------+---------------+---------+-----------+----------+-------------------+   +---------+---------------+---------+-----------+----------+--------------+ LEFT     CompressibilityPhasicitySpontaneityPropertiesThrombus Aging +---------+---------------+---------+-----------+----------+--------------+ CFV      Full           Yes      Yes                                 +---------+---------------+---------+-----------+----------+--------------+ SFJ      Full                                                        +---------+---------------+---------+-----------+----------+--------------+ FV Prox  Full                                                        +---------+---------------+---------+-----------+----------+--------------+  FV Mid   Full                                                        +---------+---------------+---------+-----------+----------+--------------+ FV DistalFull                                                        +---------+---------------+---------+-----------+----------+--------------+ PFV      Full                                                        +---------+---------------+---------+-----------+----------+--------------+ POP      Full           Yes      Yes                                 +---------+---------------+---------+-----------+----------+--------------+ PTV      Full                                                        +---------+---------------+---------+-----------+----------+--------------+ EIV                                                   appears patent +---------+---------------+---------+-----------+----------+--------------+     Summary: Right: Findings consistent with acute deep vein thrombosis involving the right common femoral vein, and right proximal profunda vein. Left: There is no evidence of deep vein thrombosis in the lower extremity.  *See table(s) above for measurements and  observations. Electronically signed by Waverly Ferrari MD on 06/09/2019 at 3:10:01 PM.    Final         Scheduled Meds: . vitamin C  500 mg Oral Daily  . aspirin EC  81 mg Oral Daily  . cholecalciferol  5,000 Units Oral Daily  . dexamethasone (DECADRON) injection  6 mg Intravenous Q24H  . donepezil  10 mg Oral QHS  . enoxaparin (LOVENOX) injection  1 mg/kg Subcutaneous Q12H  . finasteride  5 mg Oral Daily  . insulin aspart  0-20 Units Subcutaneous Q4H  . insulin aspart  10 Units Subcutaneous QID  . insulin glargine  25 Units Subcutaneous Daily  . lisinopril  2.5 mg Oral Daily  . LORazepam  1-2 mg Intravenous Once  . QUEtiapine  50 mg Oral BID  . simvastatin  40 mg Oral QHS  . thiamine injection  100 mg Intravenous Daily  . zinc sulfate  220 mg Oral Daily   Continuous Infusions: . feeding supplement (OSMOLITE 1.2 CAL) 65 mL/hr at 06/11/19 0100     LOS: 5 days   The patient is critically ill with multiple organ systems failure and  requires high complexity decision making for assessment and support, frequent evaluation and titration of therapies, application of advanced monitoring technologies and extensive interpretation of multiple databases. Critical Care Time devoted to patient care services described in this note  Time spent: 40 minutes     Chiamaka Latka, Roselind Messier, MD [facility] Pager [phone]  If 7PM-7AM, please contact night-coverage [email or web address] Password [facility] 06/11/2019, 10:59 AM

## 2019-06-11 NOTE — Plan of Care (Signed)

## 2019-06-11 NOTE — Progress Notes (Signed)
ANTICOAGULATION CONSULT NOTE  Pharmacy Consult for Lovenox Indication: DVT  No Known Allergies  Patient Measurements: Height: 5\' 11"  (180.3 cm) Weight: 163 lb 2.3 oz (74 kg) IBW/kg (Calculated) : 75.3  Vital Signs: Temp: 97.7 F (36.5 C) (01/02 0758) Temp Source: Axillary (01/02 0758) BP: 124/77 (01/02 0758) Pulse Rate: 100 (01/02 0758)  Labs: Recent Labs    06/09/19 0145 06/10/19 0925 06/11/19 0133  HGB 14.3 15.8 15.4  HCT 44.9 48.7 47.4  PLT 232 141* 119*  CREATININE 0.97 1.05 1.16    Estimated Creatinine Clearance: 44.3 mL/min (by C-G formula based on SCr of 1.16 mg/dL).    Assessment: 86 yo male admitted with COVID-19 pneumonia.  Pharmacy consulted for full-dose Lovenox due to d-dimer > 20 and worsening hypoxia.  SCr and CBC wnl. Found to have DVT  Goal of Therapy:  Anti-Xa level 0.6-1 units/ml 4hrs after LMWH dose given Monitor platelets by anticoagulation protocol: Yes   Plan:  Lovenox 80 mg sq Q 12 hours Follow up plans for po anti-coag  Thank you 95, PharmD 06/11/2019,10:41 AM

## 2019-06-11 NOTE — Progress Notes (Signed)
Patient has been lethargic most of shift. Made attempts to take off safety mitts but patient immediately attempts to take out NG tube and external catheter. Decreased stimulation worked for patient as well as reassurance. Patient has been turned several times during shift.

## 2019-06-11 NOTE — Progress Notes (Signed)
Spoke with daughter, Knute Neu, and gave update on patients status. She also informed me that patients daughter, Nunzio Cory, is a patient in room 140 and gave permission to let her know about patients status.

## 2019-06-11 NOTE — Progress Notes (Signed)
MESSAGED MD REGARDED ELEVATED K+ AND WBC.

## 2019-06-12 DIAGNOSIS — G9341 Metabolic encephalopathy: Secondary | ICD-10-CM | POA: Diagnosis present

## 2019-06-12 DIAGNOSIS — E46 Unspecified protein-calorie malnutrition: Secondary | ICD-10-CM

## 2019-06-12 DIAGNOSIS — I959 Hypotension, unspecified: Secondary | ICD-10-CM | POA: Diagnosis not present

## 2019-06-12 DIAGNOSIS — E870 Hyperosmolality and hypernatremia: Secondary | ICD-10-CM

## 2019-06-12 LAB — COMPREHENSIVE METABOLIC PANEL
ALT: 74 U/L — ABNORMAL HIGH (ref 0–44)
AST: 88 U/L — ABNORMAL HIGH (ref 15–41)
Albumin: 2.8 g/dL — ABNORMAL LOW (ref 3.5–5.0)
Alkaline Phosphatase: 143 U/L — ABNORMAL HIGH (ref 38–126)
Anion gap: 12 (ref 5–15)
BUN: 58 mg/dL — ABNORMAL HIGH (ref 8–23)
CO2: 26 mmol/L (ref 22–32)
Calcium: 9.1 mg/dL (ref 8.9–10.3)
Chloride: 110 mmol/L (ref 98–111)
Creatinine, Ser: 1.69 mg/dL — ABNORMAL HIGH (ref 0.61–1.24)
GFR calc Af Amer: 41 mL/min — ABNORMAL LOW (ref >60)
GFR calc non Af Amer: 35 mL/min — ABNORMAL LOW (ref >60)
Glucose, Bld: 235 mg/dL — ABNORMAL HIGH (ref 70–99)
Potassium: 5.5 mmol/L — ABNORMAL HIGH (ref 3.5–5.1)
Sodium: 148 mmol/L — ABNORMAL HIGH (ref 135–145)
Total Bilirubin: 0.8 mg/dL (ref 0.3–1.2)
Total Protein: 6.2 g/dL — ABNORMAL LOW (ref 6.5–8.1)

## 2019-06-12 LAB — CBC WITH DIFFERENTIAL/PLATELET
Abs Immature Granulocytes: 0.22 10*3/uL — ABNORMAL HIGH (ref 0.00–0.07)
Basophils Absolute: 0.1 10*3/uL (ref 0.0–0.1)
Basophils Relative: 1 %
Eosinophils Absolute: 0.1 10*3/uL (ref 0.0–0.5)
Eosinophils Relative: 0 %
HCT: 49.1 % (ref 39.0–52.0)
Hemoglobin: 15.7 g/dL (ref 13.0–17.0)
Immature Granulocytes: 1 %
Lymphocytes Relative: 4 %
Lymphs Abs: 0.7 10*3/uL (ref 0.7–4.0)
MCH: 30 pg (ref 26.0–34.0)
MCHC: 32 g/dL (ref 30.0–36.0)
MCV: 93.7 fL (ref 80.0–100.0)
Monocytes Absolute: 0.5 10*3/uL (ref 0.1–1.0)
Monocytes Relative: 3 %
Neutro Abs: 15 10*3/uL — ABNORMAL HIGH (ref 1.7–7.7)
Neutrophils Relative %: 91 %
Platelets: 104 10*3/uL — ABNORMAL LOW (ref 150–400)
RBC: 5.24 MIL/uL (ref 4.22–5.81)
RDW: 13.9 % (ref 11.5–15.5)
WBC: 16.5 10*3/uL — ABNORMAL HIGH (ref 4.0–10.5)
nRBC: 0.4 % — ABNORMAL HIGH (ref 0.0–0.2)

## 2019-06-12 LAB — GLUCOSE, CAPILLARY
Glucose-Capillary: 222 mg/dL — ABNORMAL HIGH (ref 70–99)
Glucose-Capillary: 251 mg/dL — ABNORMAL HIGH (ref 70–99)
Glucose-Capillary: 202 mg/dL — ABNORMAL HIGH (ref 70–99)
Glucose-Capillary: 186 mg/dL — ABNORMAL HIGH (ref 70–99)
Glucose-Capillary: 185 mg/dL — ABNORMAL HIGH (ref 70–99)
Glucose-Capillary: 192 mg/dL — ABNORMAL HIGH (ref 70–99)

## 2019-06-12 LAB — MAGNESIUM: Magnesium: 2.3 mg/dL (ref 1.7–2.4)

## 2019-06-12 LAB — C-REACTIVE PROTEIN: CRP: 1 mg/dL — ABNORMAL HIGH (ref <1.0)

## 2019-06-12 LAB — PHOSPHORUS: Phosphorus: 5.5 mg/dL — ABNORMAL HIGH (ref 2.5–4.6)

## 2019-06-12 LAB — D-DIMER, QUANTITATIVE: D-Dimer, Quant: >20 ug/mL-FEU — ABNORMAL HIGH (ref 0.00–0.50)

## 2019-06-12 LAB — FERRITIN: Ferritin: 631 ng/mL — ABNORMAL HIGH (ref 24–336)

## 2019-06-12 MED ORDER — SODIUM CHLORIDE 0.9 % IV BOLUS
1000.0000 mL | Freq: Once | INTRAVENOUS | Status: AC
  Administered 2019-06-12: 19:00:00 1000 mL via INTRAVENOUS

## 2019-06-12 MED ORDER — SODIUM CHLORIDE 0.9 % IV BOLUS
500.0000 mL | Freq: Once | INTRAVENOUS | Status: AC
  Administered 2019-06-12: 500 mL via INTRAVENOUS

## 2019-06-12 MED ORDER — SODIUM CHLORIDE 0.9 % IV SOLN: INTRAVENOUS | Status: DC

## 2019-06-12 MED ORDER — SODIUM CHLORIDE 0.9 % IV BOLUS
500.0000 mL | Freq: Once | INTRAVENOUS | Status: AC
  Administered 2019-06-12: 16:00:00 500 mL via INTRAVENOUS

## 2019-06-12 NOTE — Progress Notes (Signed)
Notified both charge nurse Dahlia Client and RRT Marchelle Folks on new MEWS score and DNR code status change. Order to give one time bolus for "soft Bp". Awaiting possible comfort care orders.

## 2019-06-12 NOTE — Progress Notes (Signed)
PROGRESS NOTE  Ethan Davis:[id number] DOB: Oct 23, 1926 DOA: 05/11/2019 PCP: Alycia Rossetti, MD  HPI/Recap of past 31 hours: 86 year old male with past medical history of protein calorie malnutrition, diabetes mellitus and hypertension and Alzheimer's dementia, but high functioning admitted on 12/28 for hypoxia from Covid.  Patient had tested positive for Covid on 12/22 and brought in with fever and confusion the following day.  He was given IV fluids and since there is no evidence of hypoxia, he was sent home.  Patient was reportedly contacted on 12/24 for treatment of Covid with bamlanivimab infusion at [facility], but there was no callback from patient/family reported.  Since then, patient has been eating less and then was brought back in on 12/28 and noted to be hypoxic.  The emergency room, he initially required 6 L nasal cannula.  Patient since then has been treated with IV remdesivir, steroids and received 1 dose of IV Actemra.  His oxygenation has gotten worse to the point where he is now on high flow oxygen at 15 L.  Since admission, patient sodium has continued to trend upward, in part due to poor p.o. intake.  His CRP has trended downward.  Today, patient's blood pressure little low and he has become less responsive.  In a discussion with his daughter, she affirmed that patient would not want extraordinary measures and patient was formally changed to a DNR.  Assessment/Plan: Active Problems:   Protein-calorie malnutrition ([place]): Nutrition to see.  At this point, with decreased mentation, not safe to give p.o.    Dementia without acute metabolic encephalopathy/behavioral disturbance ([place]): Likely secondary to hypoxia plus hyponatremia.    Pneumonia due to COVID-19 virus causing acute respiratory failure with hypoxia: Status post remdesivir and Actemra.  Getting IV steroids.  Oxygenation has declined since hospitalization started.  Given his advanced age, there is a good chance he will not  survive this hospitalization.  Discussed this with daughter who agrees and he is not formally DNR.    Essential hypertension: As below.  Now hypotensive.    Diabetes mellitus type 2, uncontrolled, with Diabetic neuropathy ([place]): CBG is on the higher end, above 200.  Will slightly increase sliding scale coverage.   Hypotension: May be in part from poor p.o. intake.  Oxygenation this moment appears steady and stable with noted improvement in CRP.  We will try fluid bolus and IV fluids.    Hypernatremia: Secondary to poor p.o. intake.  Starting IV fluids.   Code Status: DNR  Family Communication: Discussed with daughter by phone  Disposition Plan: Unclear.  I suspect patient may not make it through this hospitalization.  If he does, will need to improve mentation, oral intake and resolve hypoxia   Consultants:  None  Procedures:  None  Antimicrobials:  IV Remdisivir 12/28-1/1  IV Actemra x1 dose  DVT prophylaxis: Lovenox   Objective: Vitals:   06/12/19 1139 06/12/19 1539  BP: (!) 91/56 102/72  Pulse: (!) 104 (!) 102  Resp: (!) 22 (!) 23  Temp: 98.1 F (36.7 C) 98.1 F (36.7 C)  SpO2: 94% 99%    Intake/Output Summary (Last 24 hours) at 06/12/2019 1606 Last data filed at 06/12/2019 1139 Gross per 24 hour  Intake 455 ml  Output 800 ml  Net -345 ml   Filed Weights   06/07/19 0004 06/08/19 0500 06/12/19 0444  Weight: 77.7 kg 74 kg 76.7 kg   Body mass index is 23.58 kg/m.  Exam:   General: Minimally responsive, only  to pain  HEENT: Normocephalic and atraumatic, mucous membranes are slightly dry, on facemask  Neck: Supple, no JVD  Cardiovascular: Regular rhythm, ectopic beat, borderline tachycardia  Respiratory: Decreased breath sounds throughout  Abdomen: Soft, nondistended, hypoactive bowel sounds  Musculoskeletal: No clubbing or cyanosis or pitting edema  Skin: No skin breaks, tears or lesions  Psychiatry: Currently minimally responsive   Data  Reviewed: CBC: Recent Labs  Lab 06/08/19 0005 06/09/19 0145 06/10/19 0925 06/11/19 0133 06/12/19 0437  WBC 11.2* 9.2 10.5 15.1* 16.5*  NEUTROABS 10.1* 7.8* 9.4* 13.9* 15.0*  HGB 14.3 14.3 15.8 15.4 15.7  HCT 45.2 44.9 48.7 47.4 49.1  MCV 93.8 93.3 92.9 93.7 93.7  PLT 191 232 141* 119* 104*   Basic Metabolic Panel: Recent Labs  Lab 06/08/19 0005 06/09/19 0145 06/10/19 0925 06/11/19 0133 06/12/19 0437  NA 144 145 147* 147* 148*  K 4.2 4.2 4.7 5.2* 5.5*  CL 106 107 110 110 110  CO2 24 22 27 26 26   GLUCOSE 228* 373* 341* 322* 235*  BUN 24* 29* 30* 44* 58*  CREATININE 0.99 0.97 1.05 1.16 1.69*  CALCIUM 8.8* 9.1 9.2 9.2 9.1  MG 2.3 2.4 2.2 2.3 2.3  PHOS 2.2* 2.7 3.0 3.9 5.5*   GFR: Estimated Creatinine Clearance: 30.9 mL/min (A) (by C-G formula based on SCr of 1.69 mg/dL (H)). Liver Function Tests: Recent Labs  Lab 06/08/19 0005 06/09/19 0145 06/10/19 0925 06/11/19 0133 06/12/19 0437  AST 34 41 62* 77* 88*  ALT 19 23 35 44 74*  ALKPHOS 95 103 118 134* 143*  BILITOT 1.1 1.1 1.0 0.9 0.8  PROT 7.6 7.1 6.5 6.1* 6.2*  ALBUMIN 3.1* 3.1* 2.8* 2.8* 2.8*   No results for input(s): LIPASE, AMYLASE in the last 168 hours. No results for input(s): AMMONIA in the last 168 hours. Coagulation Profile: No results for input(s): INR, PROTIME in the last 168 hours. Cardiac Enzymes: No results for input(s): CKTOTAL, CKMB, CKMBINDEX, TROPONINI in the last 168 hours. BNP (last 3 results) No results for input(s): PROBNP in the last 8760 hours. HbA1C: No results for input(s): HGBA1C in the last 72 hours. CBG: Recent Labs  Lab 06/11/19 2151 06/12/19 0056 06/12/19 0326 06/12/19 0733 06/12/19 1221  GLUCAP 270* 222* 251* 202* 186*   Lipid Profile: No results for input(s): CHOL, HDL, LDLCALC, TRIG, CHOLHDL, LDLDIRECT in the last 72 hours. Thyroid Function Tests: No results for input(s): TSH, T4TOTAL, FREET4, T3FREE, THYROIDAB in the last 72 hours. Anemia Panel: Recent Labs     06/11/19 0133 06/12/19 0437  FERRITIN 566* 631*   Urine analysis:    Component Value Date/Time   COLORURINE YELLOW (A) 06/01/2019 1412   APPEARANCEUR CLOUDY (A) 06/01/2019 1412   LABSPEC 1.030 06/01/2019 1412   PHURINE 5.0 06/01/2019 1412   GLUCOSEU >=500 (A) 06/01/2019 1412   HGBUR SMALL (A) 06/01/2019 1412   BILIRUBINUR NEGATIVE 06/01/2019 1412   KETONESUR 5 (A) 06/01/2019 1412   PROTEINUR 100 (A) 06/01/2019 1412   NITRITE NEGATIVE 06/01/2019 1412   LEUKOCYTESUR NEGATIVE 06/01/2019 1412   Sepsis Labs: @LABRCNTIP (procalcitonin:4,lacticidven:4)  ) Recent Results (from the past 240 hour(s))  Respiratory Panel by RT PCR (Flu A&B, Covid) - Nasopharyngeal Swab     Status: Abnormal   Collection Time: 16-Jun-2019  9:30 PM   Specimen: Nasopharyngeal Swab  Result Value Ref Range Status   SARS Coronavirus 2 by RT PCR POSITIVE (A) NEGATIVE Final    Comment: RESULT CALLED TO, READ BACK BY AND VERIFIED WITH: MYRICK,B.  AT 2219 ON 06/01/2019 BY EVA (NOTE) SARS-CoV-2 target nucleic acids are DETECTED. SARS-CoV-2 RNA is generally detectable in upper respiratory specimens  during the acute phase of infection. Positive results are indicative of the presence of the identified virus, but do not rule out bacterial infection or co-infection with other pathogens not detected by the test. Clinical correlation with patient history and other diagnostic information is necessary to determine patient infection status. The expected result is Negative. Fact Sheet for Patients:  https://www.moore.com/ Fact Sheet for Healthcare Providers: https://www.young.biz/ This test is not yet approved or cleared by the [place] FDA and  has been authorized for detection and/or diagnosis of SARS-CoV-2 by FDA under an Emergency Use Authorization (EUA).  This EUA will remain in effect (meaning this test can be used ) for the duration of  the COVID-19 declaration under  Section 564(b)(1) of the Act, 21 U.S.C. section 360bbb-3(b)(1), unless the authorization is terminated or revoked sooner.    Influenza A by PCR NEGATIVE NEGATIVE Final   Influenza B by PCR NEGATIVE NEGATIVE Final    Comment: (NOTE) The Xpert Xpress SARS-CoV-2/FLU/RSV assay is intended as an aid in  the diagnosis of influenza from Nasopharyngeal swab specimens and  should not be used as a sole basis for treatment. Nasal washings and  aspirates are unacceptable for Xpert Xpress SARS-CoV-2/FLU/RSV  testing. Fact Sheet for Patients: https://www.moore.com/ Fact Sheet for Healthcare Providers: https://www.young.biz/ This test is not yet approved or cleared by the [place] FDA and  has been authorized for detection and/or diagnosis of SARS-CoV-2 by  FDA under an Emergency Use Authorization (EUA). This EUA will remain  in effect (meaning this test can be used) for the duration of the  Covid-19 declaration under Section 564(b)(1) of the Act, 21  U.S.C. section 360bbb-3(b)(1), unless the authorization is  terminated or revoked. Performed at [facility], [address] [place], [place] [address]       Studies: No results found.  Scheduled Meds: . vitamin C  500 mg Per Tube Daily  . aspirin EC  81 mg Oral Daily  . cholecalciferol  5,000 Units Per Tube Daily  . dexamethasone (DECADRON) injection  6 mg Intravenous Q24H  . donepezil  10 mg Per Tube QHS  . enoxaparin (LOVENOX) injection  1 mg/kg Subcutaneous Q12H  . finasteride  5 mg Oral Daily  . insulin aspart  0-20 Units Subcutaneous Q4H  . insulin aspart  15 Units Subcutaneous QID  . insulin glargine  30 Units Subcutaneous Daily  . LORazepam  1-2 mg Intravenous Once  . QUEtiapine  50 mg Per Tube BID  . simvastatin  40 mg Oral QHS  . thiamine injection  100 mg Intravenous Daily  . zinc sulfate  220 mg Per Tube Daily    Continuous Infusions: . sodium chloride    . feeding supplement  (OSMOLITE 1.2 CAL) 1,000 mL (06/11/19 1300)     LOS: 6 days     Hollice Espy, MD [facility]  To reach me or the doctor on call, go to: [email or web address] Password [email or web address]  06/12/2019, 4:06 PM

## 2019-06-12 NOTE — Progress Notes (Signed)
Writer contacted MD regard pt new increasing LOC, soft BP and tachy pulse. Asked for clarification on code status. Awaiting orders. Will continue to monitor.

## 2019-06-12 NOTE — Progress Notes (Signed)
Patients family has been updated on his status.

## 2019-06-12 NOTE — Progress Notes (Signed)
Spoke with patients daughter, Knute Neu, and gave an update on patients status.

## 2019-06-12 NOTE — Progress Notes (Signed)
Patients BP continues to drop. MD aware. Has ordered bolus which has been initiated. Will continue to monitor patient.

## 2019-06-12 NOTE — Progress Notes (Signed)
Dr. Onalee Hua paged, patient blood pressure 78/54, patient was bolused twice today with 500cc and 1000cc. Orders to give 500cc normal saline bolus per Dr. Onalee Hua

## 2019-06-13 ENCOUNTER — Telehealth: Payer: Self-pay | Admitting: *Deleted

## 2019-06-13 LAB — GLUCOSE, CAPILLARY
Glucose-Capillary: 140 mg/dL — ABNORMAL HIGH (ref 70–99)
Glucose-Capillary: 169 mg/dL — ABNORMAL HIGH (ref 70–99)
Glucose-Capillary: 235 mg/dL — ABNORMAL HIGH (ref 70–99)
Glucose-Capillary: 297 mg/dL — ABNORMAL HIGH (ref 70–99)
Glucose-Capillary: 325 mg/dL — ABNORMAL HIGH (ref 70–99)

## 2019-06-13 MED ORDER — MORPHINE SULFATE (PF) 2 MG/ML IV SOLN
4.0000 mg | INTRAVENOUS | Status: DC | PRN
  Administered 2019-06-13: 06:00:00 4 mg via INTRAVENOUS
  Filled 2019-06-13: qty 2

## 2019-06-13 MED ORDER — SODIUM CHLORIDE 0.9 % IV BOLUS
500.0000 mL | Freq: Once | INTRAVENOUS | Status: AC
  Administered 2019-06-13: 07:00:00 500 mL via INTRAVENOUS

## 2019-06-23 ENCOUNTER — Other Ambulatory Visit: Payer: Medicare Other | Admitting: Nurse Practitioner

## 2019-07-11 NOTE — Progress Notes (Signed)
Dr. Onalee Hua paged waiting call back/orders put in patient respirations are in the 50's, heart rate 130's.

## 2019-07-11 NOTE — Progress Notes (Signed)
Dr. Onalee Hua repaged patient BP in the 40's, respirations currently 18, normal saline 500cc bolus currently being administred. No rapid response called, charge RN Selena Batten is at bedside. Daughter updated on patients condition.

## 2019-07-11 NOTE — Progress Notes (Signed)
Spoke with daughter Knute Neu [phone]) in reference to items in the safe. She stated that she will come to pick up items tomorrow (07/06/19).

## 2019-07-11 NOTE — Telephone Encounter (Signed)
Received call from Dr. Rito Ehrlich with [facility]. Reports that patient passed on 06/19/19 due to COIVD related issues. Family has been made aware.   MD to be made aware.

## 2019-07-11 NOTE — Death Summary Note (Signed)
Death Summary  Ethan Davis WUJ:[id number] DOB: 27-Aug-1926 DOA: 23-Jun-2018  PCP: Salley Scarleturham, Kawanta F, MD  Admit date: 23-Jun-2018 Date of Death: 07/05/2019 Time of Death: 7:47am Notification: Salley Scarleturham, Kawanta F, MD notified of death of 07/08/2019   History of present illness:  Ethan Davis is a 86 year old male with past medical history of protein calorie malnutrition, diabetes mellitus and hypertension and Alzheimer's dementia, but high functioning admitted on 12/28 for hypoxia from Covid.  Patient had tested positive for Covid on 12/22 and brought in with fever and confusion the following day.  He was given IV fluids and since there is no evidence of hypoxia, he was sent home.  Patient was reportedly contacted on 12/24 for [facility] Cov[facility], but there was no callback from patient/family reported.  Since then, patient has been eating less and then was brought back in on 12/28 and noted to be hypoxic.  In the emergency room, he initially required 6 L nasal cannula.  Final Diagnoses:   Pneumonia due to COVID-19 virus causing acute respiratory failure with hypoxia: Patient was treated with IV remdesivir, steroids and 1 dose of IV Actemra.  However, he continued to decline in regards to his oxygenation to the point where he was on 15 L high flow.  This is despite his CRP trending downward.  He started becoming less responsive.  I discussed this with his daughter on 1/3 and his CODE STATUS was changed to DNR.  Patient passed away at 7:47 AM on 1/4.   Protein-calorie malnutrition (HCC): Unable to confirm as patient passed away before nutrition could see.     Dementia without acute metabolic encephalopathy/behavioral disturbance (HCC): Secondary to hypoxia plus hyponatremia.  His encephalopathy did not clear prior to his death.  Hypotension in the setting of Essential hypertension: Blood pressure medicines were held following patient started to become hypotensive.   Despite fluid boluses, this persisted until he passed away.    Diabetes mellitus type 2, uncontrolled, with Diabetic neuropathy (HCC): CBG is on the higher end, above 200.    We attempted to increase sliding scale coverage.    Hypernatremia: Secondary to poor p.o. intake.    Sodium continue to trend upward and was at 148 on 1/3.   The results of significant diagnostics from this hospitalization (including imaging, microbiology, ancillary and laboratory) are listed below for reference.    Significant Diagnostic Studies: DG Chest 1 View  Result Date: 23-Jun-2018 CLINICAL DATA:  Shortness of breath, COVID-19 positive EXAM: CHEST  1 VIEW COMPARISON:  06/01/2019 FINDINGS: Heart size is within normal limits. Interval development of extensive airspace opacities throughout the right lung, more consolidative within the right lower lobe. Streaky left basilar airspace opacity. No pleural effusion or pneumothorax. IMPRESSION: Interval development of extensive airspace opacities throughout the right lung, more consolidative within the right lower lobe. Streaky left basilar airspace opacity. Findings suspicious for multifocal atypical/viral pneumonia. Electronically Signed   By: Duanne GuessNicholas  Plundo D.O.   On: 23-Jun-2018 13:43   DG Chest 2 View  Result Date: 06/01/2019 CLINICAL DATA:  Hypotension.  Sinus infection. EXAM: CHEST - 2 VIEW COMPARISON:  Single-view of the chest 06/27/2017. FINDINGS: Lung volumes are low with mild basilar atelectasis. No consolidative process, pneumothorax or effusion. Heart size is normal. Calcified mediastinal lymph nodes and calcified right lower lobe granuloma are unchanged. No acute or focal bony abnormality. IMPRESSION: No acute disease. Electronically Signed   By: Drusilla Kannerhomas  Dalessio M.D.   On: 06/01/2019 11:29  CT Head Wo Contrast  Result Date: 06/01/2019 CLINICAL DATA:  Altered mental status EXAM: CT HEAD WITHOUT CONTRAST TECHNIQUE: Contiguous axial images were obtained from  the base of the skull through the vertex without intravenous contrast. COMPARISON:  None. FINDINGS: Brain: There is no acute intracranial hemorrhage, mass-effect, or edema. Gray-white differentiation is preserved. There is no extra-axial fluid collection. Patchy and confluent hypoattenuation in the supratentorial white matter is nonspecific but probably reflects moderate chronic microvascular ischemic changes. Prominence of the ventricles and sulci reflects mild to moderate generalized parenchymal volume loss. There is slightly disproportionate ventricular prominence, which is likely on an ex vacuo basis. Vascular: There is atherosclerotic calcification at the skull base. Skull: Calvarium is unremarkable. Sinuses/Orbits: No acute finding. Other: None. IMPRESSION: No acute intracranial hemorrhage, mass effect, or evidence of acute infarction. Moderate chronic microvascular ischemic changes. Electronically Signed   By: Guadlupe Spanish M.D.   On: 06/01/2019 13:30   CT ANGIO CHEST PE W OR WO CONTRAST  Result Date: 06/07/2019 CLINICAL DATA:  Concern for PE.  Shortness of breath. EXAM: CT ANGIOGRAPHY CHEST WITH CONTRAST TECHNIQUE: Multidetector CT imaging of the chest was performed using the standard protocol during bolus administration of intravenous contrast. Multiplanar CT image reconstructions and MIPs were obtained to evaluate the vascular anatomy. CONTRAST:  29mL OMNIPAQUE IOHEXOL 350 MG/ML SOLN COMPARISON:  None. FINDINGS: Cardiovascular: Evaluation for pulmonary emboli is limited by extensive respiratory motion artifact.Given this limitation, no large centrally located pulmonary embolism was detected. Detection of smaller pulmonary emboli is severely limited. Main pulmonary artery is not significantly dilated. Aortic calcifications are noted. There is no dissection. No significant aneurysm. Heart size is normal. Coronary artery calcifications are noted. There is no significant pericardial effusion.  Mediastinum/Nodes: --No mediastinal or hilar lymphadenopathy. --No axillary lymphadenopathy. --No supraclavicular lymphadenopathy. --Normal thyroid gland. --The esophagus is unremarkable Lungs/Pleura: There are asymmetric ground-glass airspace opacities involving the right upper, right middle, and right lower lobes. The left lower lobe is involved to a lesser degree. There is some right lung volume loss. There is no pneumothorax. No large pleural effusion. The trachea is unremarkable. There are mild emphysematous changes bilaterally. There is a calcified pleural based plaque at the right lung base. Upper Abdomen: No acute abnormality. The enteric tube terminates within the gastric body. Musculoskeletal: No chest wall abnormality. No acute or significant osseous findings. Review of the MIP images confirms the above findings. IMPRESSION: 1. Evaluation for pulmonary emboli is significantly limited as detailed above. Given this limitation, no PE was identified. 2. Asymmetric ground-glass airspace opacities primarily involving the right lung. Findings are consistent with the patient's reported history of viral pneumonia. Asymmetric pulmonary edema could have a similar appearance in the appropriate clinical setting. Aortic Atherosclerosis (ICD10-I70.0) and Emphysema (ICD10-J43.9). Electronically Signed   By: Katherine Mantle M.D.   On: 06/07/2019 18:21   Portable chest 1 View  Result Date: 06/07/2019 CLINICAL DATA:  Shortness of breath EXAM: PORTABLE CHEST 1 VIEW COMPARISON:  Yesterday FINDINGS: Pulmonary infiltrates asymmetric to the right where there is greater volume loss. Normal heart size when accounting for rotation. Aortic tortuosity accentuated by positioning. No evidence of effusion or pneumothorax. IMPRESSION: Bilateral pulmonary infiltrate with similar appearance to yesterday. Electronically Signed   By: Marnee Spring M.D.   On: 06/07/2019 07:39   DG Abd Portable 1V  Result Date:  06/09/2019 CLINICAL DATA:  Nasogastric tube advancement EXAM: PORTABLE ABDOMEN - 1 VIEW COMPARISON:  Earlier today FINDINGS: Advanced nasogastric tube with tip and side-port over  the gastric fundus. Low volume chest with infiltrates on both sides. Limited visualization of bowel without dilatation. IMPRESSION: Advanced nasogastric tube with tip and side-port in good position over the stomach. Electronically Signed   By: Marnee SpringJonathon  Watts M.D.   On: 06/09/2019 06:01   DG Abd Portable 1V  Result Date: 06/09/2019 CLINICAL DATA:  NG tube placement EXAM: PORTABLE ABDOMEN - 1 VIEW COMPARISON:  06/07/2019 FINDINGS: Interstitial and ground-glass opacities in the mid to lower lung zones. Esophageal tube tip overlies the proximal stomach. Side port appears positioned near the GE junction. Upper gas pattern is unremarkable IMPRESSION: Esophageal tube tip overlies the proximal stomach, side-port appears to be at the level of GE junction, consider further advancement for more optimal positioning Electronically Signed   By: Jasmine PangKim  Fujinaga M.D.   On: 06/09/2019 03:39   DG Abd Portable 1V  Result Date: 06/07/2019 CLINICAL DATA:  Feeding tube placement. EXAM: PORTABLE ABDOMEN - 1 VIEW COMPARISON:  Chest x-ray from same day. FINDINGS: New feeding tube with tip in the gastric fundus. The bowel gas pattern is normal. No radio-opaque calculi or other significant radiographic abnormality are seen. No acute osseous abnormality. Unchanged bibasilar pulmonary infiltrates. IMPRESSION: Feeding tube tip in the gastric fundus. Electronically Signed   By: Obie DredgeWilliam T Derry M.D.   On: 06/07/2019 15:37   VAS US LOWER EXTREMITY VENOUS (DVT)  Result Date: 06/09/2019  Lower Venous Study Other Indications: Covid positive with elevated d-dimer. Comparison Study: No priors. Performing Technologist: Marilynne Halstedita Sturdivant RDMS, RVT  Examination Guidelines: A complete evaluation includes B-mode imaging, spectral Doppler, color Doppler, and power  Doppler as needed of all accessible portions of each vessel. Bilateral testing is considered an integral part of a complete examination. Limited examinations for reoccurring indications may be performed as noted.  +---------+---------------+---------+-----------+----------+-------------------+ RIGHT    CompressibilityPhasicitySpontaneityPropertiesThrombus Aging      +---------+---------------+---------+-----------+----------+-------------------+ CFV      None           No       No                                       +---------+---------------+---------+-----------+----------+-------------------+ SFJ      None                                                             +---------+---------------+---------+-----------+----------+-------------------+ FV Prox  Full                                                             +---------+---------------+---------+-----------+----------+-------------------+ FV Mid   Full                                                             +---------+---------------+---------+-----------+----------+-------------------+ FV DistalFull                                                             +---------+---------------+---------+-----------+----------+-------------------+  PFV      None           No       No                                       +---------+---------------+---------+-----------+----------+-------------------+ POP      Full           Yes      Yes                                      +---------+---------------+---------+-----------+----------+-------------------+ PTV      Full                                                             +---------+---------------+---------+-----------+----------+-------------------+ PERO     Full                                                             +---------+---------------+---------+-----------+----------+-------------------+ REIV                                                   appears patent,                                                           poorly visualized                                                         due to bowel gas    +---------+---------------+---------+-----------+----------+-------------------+   +---------+---------------+---------+-----------+----------+--------------+ LEFT     CompressibilityPhasicitySpontaneityPropertiesThrombus Aging +---------+---------------+---------+-----------+----------+--------------+ CFV      Full           Yes      Yes                                 +---------+---------------+---------+-----------+----------+--------------+ SFJ      Full                                                        +---------+---------------+---------+-----------+----------+--------------+ FV Prox  Full                                                        +---------+---------------+---------+-----------+----------+--------------+  FV Mid   Full                                                        +---------+---------------+---------+-----------+----------+--------------+ FV DistalFull                                                        +---------+---------------+---------+-----------+----------+--------------+ PFV      Full                                                        +---------+---------------+---------+-----------+----------+--------------+ POP      Full           Yes      Yes                                 +---------+---------------+---------+-----------+----------+--------------+ PTV      Full                                                        +---------+---------------+---------+-----------+----------+--------------+ EIV                                                   appears patent +---------+---------------+---------+-----------+----------+--------------+     Summary: Right: Findings consistent with acute deep vein  thrombosis involving the right common femoral vein, and right proximal profunda vein. Left: There is no evidence of deep vein thrombosis in the lower extremity.  *See table(s) above for measurements and observations. Electronically signed by Waverly Ferrari MD on 06/09/2019 at 3:10:01 PM.    Final     Microbiology: Recent Results (from the past 240 hour(s))  Respiratory Panel by RT PCR (Flu A&B, Covid) - Nasopharyngeal Swab     Status: Abnormal   Collection Time: 05/24/2019  9:30 PM   Specimen: Nasopharyngeal Swab  Result Value Ref Range Status   SARS Coronavirus 2 by RT PCR POSITIVE (A) NEGATIVE Final    Comment: RESULT CALLED TO, READ BACK BY AND VERIFIED WITH: MYRICK,B. AT 2219 ON 05/13/2019 BY EVA (NOTE) SARS-CoV-2 target nucleic acids are DETECTED. SARS-CoV-2 RNA is generally detectable in upper respiratory specimens  during the acute phase of infection. Positive results are indicative of the presence of the identified virus, but do not rule out bacterial infection or co-infection with other pathogens not detected by the test. Clinical correlation with patient history and other diagnostic information is necessary to determine patient infection status. The expected result is Negative. Fact Sheet for Patients:  https://www.moore.com/ Fact Sheet for Healthcare Providers: https://www.young.biz/ This test is not yet approved or cleared by the [place] and  has been authorized  for detection and/or diagnosis of SARS-CoV-2 by FDA under an Emergency Use Authorization (EUA).  This EUA will remain in effect (meaning this test can be used ) for the duration of  the COVID-19 declaration under Section 564(b)(1) of the Act, 21 U.S.C. section 360bbb-3(b)(1), unless the authorization is terminated or revoked sooner.    Influenza A by PCR NEGATIVE NEGATIVE Final   Influenza B by PCR NEGATIVE NEGATIVE Final    Comment: (NOTE) The Xpert Xpress  SARS-CoV-2/FLU/RSV assay is intended as an aid in  the diagnosis of influenza from Nasopharyngeal swab specimens and  should not be used as a sole basis for treatment. Nasal washings and  aspirates are unacceptable for Xpert Xpress SARS-CoV-2/FLU/RSV  testing. Fact Sheet for Patients: PinkCheek.be Fact Sheet for Healthcare Providers: GravelBags.it This test is not yet approved or cleared by the [place] FDA and  has been authorized for detection and/or diagnosis of SARS-CoV-2 by  FDA under an Emergency Use Authorization (EUA). This EUA will remain  in effect (meaning this test can be used) for the duration of the  Covid-19 declaration under Section 564(b)(1) of the Act, 21  U.S.C. section 360bbb-3(b)(1), unless the authorization is  terminated or revoked. Performed at [facility], [address] [place], [place] [address]      Labs: Basic Metabolic Panel: Recent Labs  Lab 06/08/19 0005 06/09/19 0145 06/10/19 0925 06/11/19 0133 06/12/19 0437  NA 144 145 147* 147* 148*  K 4.2 4.2 4.7 5.2* 5.5*  CL 106 107 110 110 110  CO2 24 22 27 26 26   GLUCOSE 228* 373* 341* 322* 235*  BUN 24* 29* 30* 44* 58*  CREATININE 0.99 0.97 1.05 1.16 1.69*  CALCIUM 8.8* 9.1 9.2 9.2 9.1  MG 2.3 2.4 2.2 2.3 2.3  PHOS 2.2* 2.7 3.0 3.9 5.5*   Liver Function Tests: Recent Labs  Lab 06/08/19 0005 06/09/19 0145 06/10/19 0925 06/11/19 0133 06/12/19 0437  AST 34 41 62* 77* 88*  ALT 19 23 35 44 74*  ALKPHOS 95 103 118 134* 143*  BILITOT 1.1 1.1 1.0 0.9 0.8  PROT 7.6 7.1 6.5 6.1* 6.2*  ALBUMIN 3.1* 3.1* 2.8* 2.8* 2.8*   No results for input(s): LIPASE, AMYLASE in the last 168 hours. No results for input(s): AMMONIA in the last 168 hours. CBC: Recent Labs  Lab 06/08/19 0005 06/09/19 0145 06/10/19 0925 06/11/19 0133 06/12/19 0437  WBC 11.2* 9.2 10.5 15.1* 16.5*  NEUTROABS 10.1* 7.8* 9.4* 13.9* 15.0*  HGB 14.3 14.3 15.8 15.4  15.7  HCT 45.2 44.9 48.7 47.4 49.1  MCV 93.8 93.3 92.9 93.7 93.7  PLT 191 232 141* 119* 104*   Cardiac Enzymes: No results for input(s): CKTOTAL, CKMB, CKMBINDEX, TROPONINI in the last 168 hours. D-Dimer Recent Labs    06/11/19 0133 06/12/19 0437  DDIMER >20.00* >20.00*   BNP: Invalid input(s): POCBNP CBG: Recent Labs  Lab 06/12/19 1221 06/12/19 1716 06/12/19 1921 06/12/19 2336 06/24/2019 0328  GLUCAP 186* 185* 192* 140* 169*   Anemia work up Recent Labs    06/11/19 0133 06/12/19 0437  FERRITIN 566* 631*   Urinalysis    Component Value Date/Time   COLORURINE YELLOW (A) 06/01/2019 1412   APPEARANCEUR CLOUDY (A) 06/01/2019 1412   LABSPEC 1.030 06/01/2019 1412   PHURINE 5.0 06/01/2019 1412   GLUCOSEU >=500 (A) 06/01/2019 1412   HGBUR SMALL (A) 06/01/2019 1412   BILIRUBINUR NEGATIVE 06/01/2019 1412   KETONESUR 5 (A) 06/01/2019 1412   PROTEINUR 100 (A) 06/01/2019 1412  NITRITE NEGATIVE 06/01/2019 1412   LEUKOCYTESUR NEGATIVE 06/01/2019 1412   Sepsis Labs Invalid input(s): PROCALCITONIN,  WBC,  LACTICIDVEN     SIGNED:  Hollice Espy, MD  [facility] 06/28/19, 8:09 AM Pager   If 7PM-7AM, please contact night-coverage [email or web address] Password [email or web address]

## 2019-07-11 NOTE — Telephone Encounter (Signed)
I spoke with pt daughter Knute Neu, given my condolences

## 2019-07-11 DEATH — deceased

## 2019-07-20 ENCOUNTER — Other Ambulatory Visit: Payer: Self-pay | Admitting: Family Medicine

## 2019-09-23 ENCOUNTER — Ambulatory Visit: Payer: Medicare Other | Admitting: Family Medicine

## 2020-05-28 IMAGING — CT CT ANGIO CHEST
1 of 2 series · 14 of 32 positions shown · IV contrast (OMNIPAQUE)
Comparison: None.

CLINICAL DATA: Concern for PE.  Shortness of breath.

EXAM:
CT ANGIOGRAPHY CHEST WITH CONTRAST
TECHNIQUE: Multidetector CT imaging of the chest was performed using the
standard protocol during bolus administration of intravenous
contrast. Multiplanar CT image reconstructions and MIPs were
obtained to evaluate the vascular anatomy.
CONTRAST:  80mL OMNIPAQUE IOHEXOL 350 MG/ML SOLN

[Series 4: pe chest · axial · 0.77mm/px · z∈[+801,+1125]mm · 14 of 192 slices shown]
[im 15/192  lung]
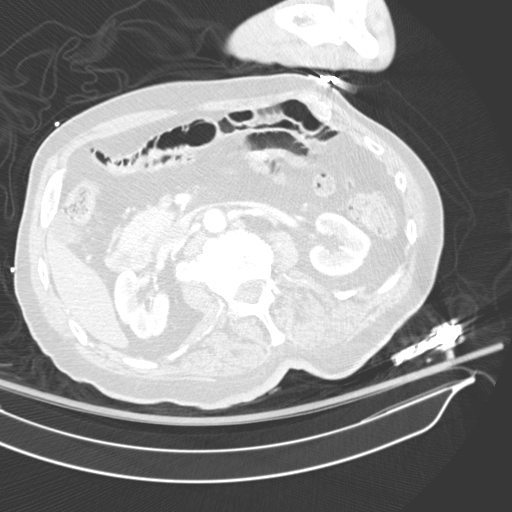
[im 30/192  mediastinal]
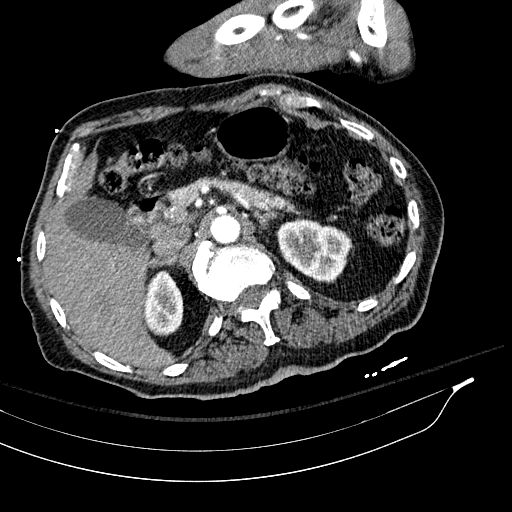
[im 45/192  lung]
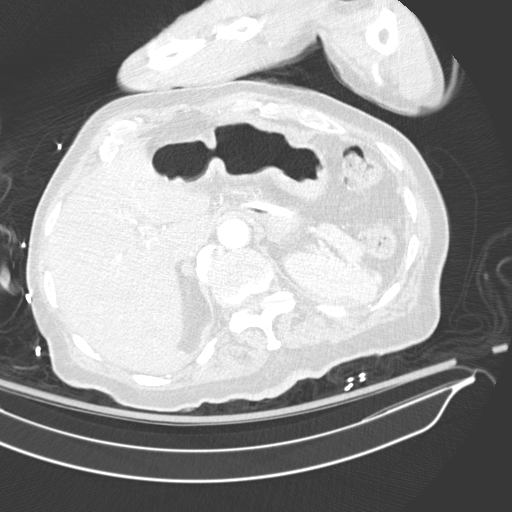
[im 59/192  mediastinal]
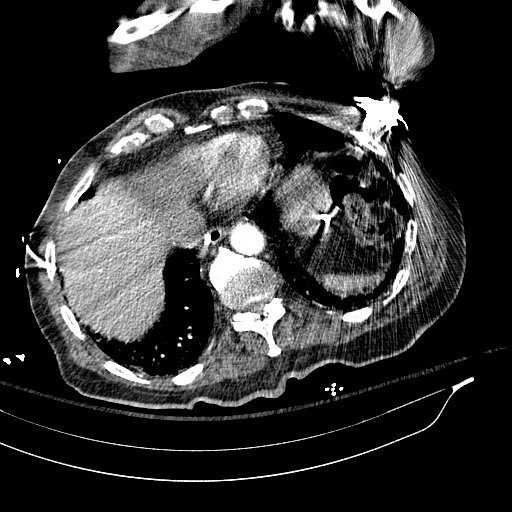
[im 74/192  lung]
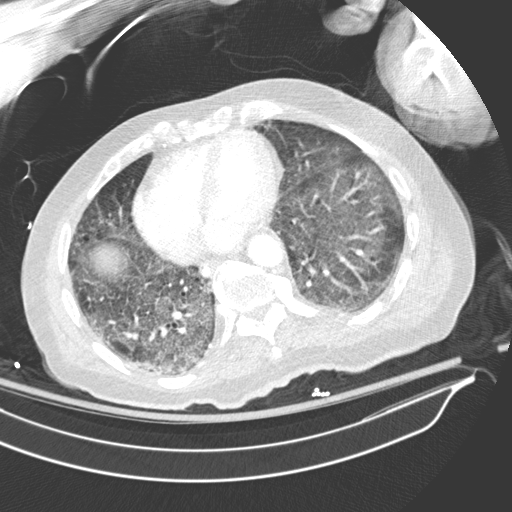
[im 89/192  mediastinal]
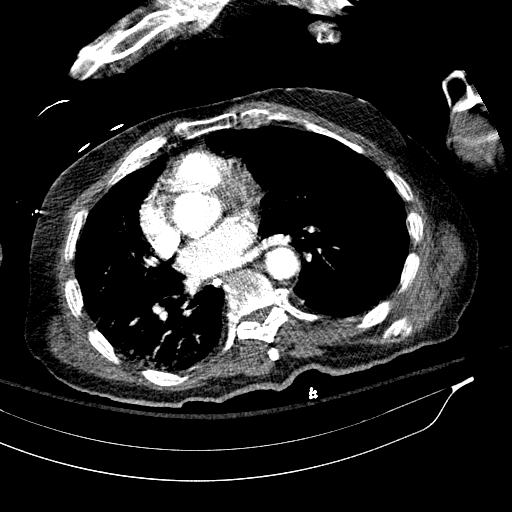
[im 92/192  lung]
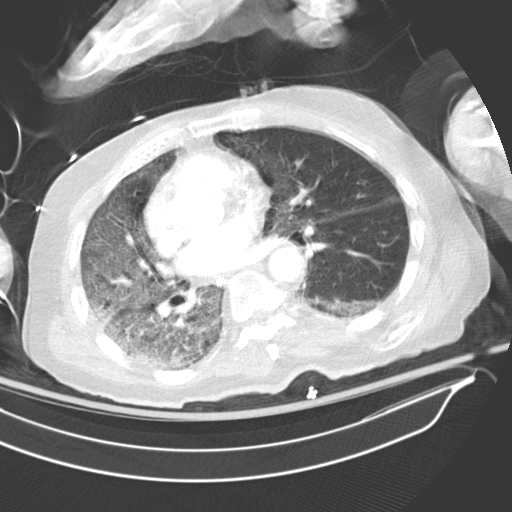
[im 96/192  mediastinal]
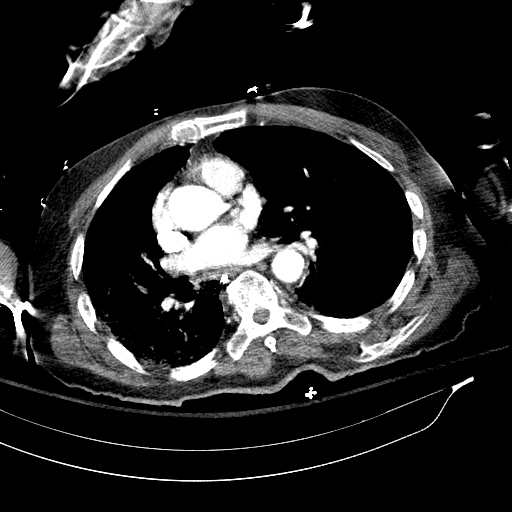
[im 103/192  lung]
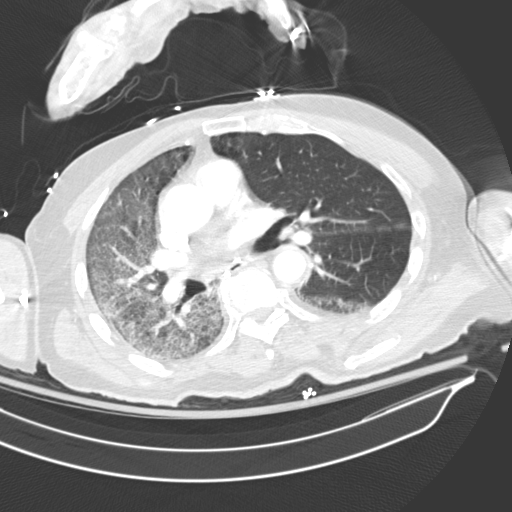
[im 118/192  mediastinal]
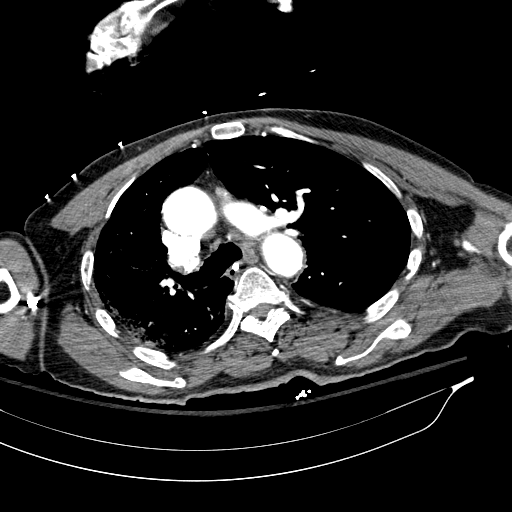
[im 133/192  lung]
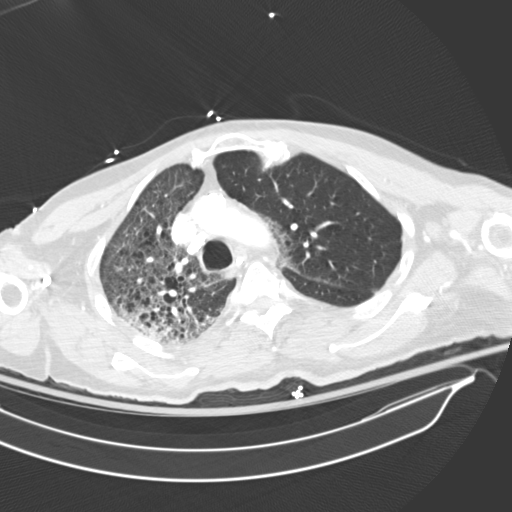
[im 147/192  mediastinal]
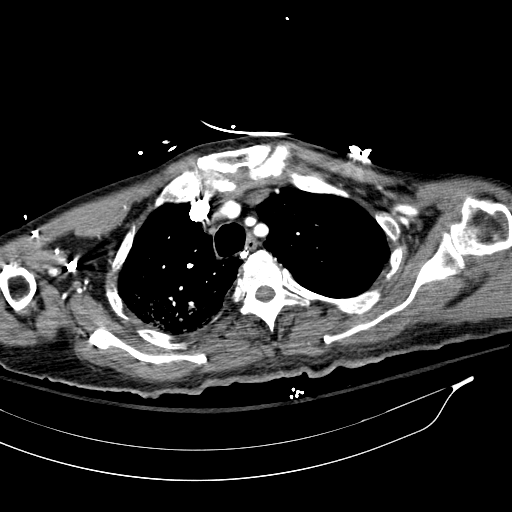
[im 162/192  lung]
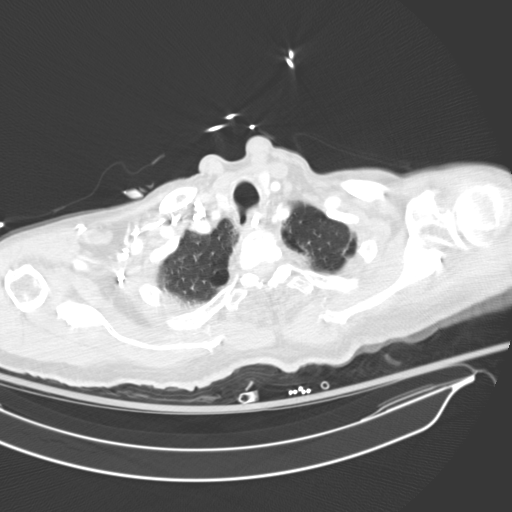
[im 177/192  mediastinal]
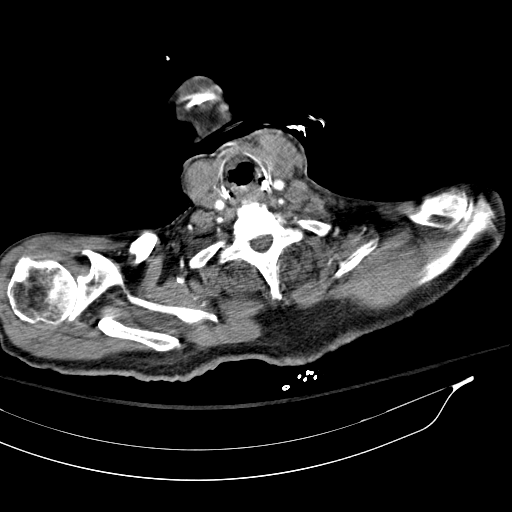

[14 of 32 positions shown; findings below may reference images not displayed]

FINDINGS: Cardiovascular: Evaluation for pulmonary emboli is limited by
extensive respiratory motion artifact.Given this limitation, no
large centrally located pulmonary embolism was detected. Detection
of smaller pulmonary emboli is severely limited. Main pulmonary
artery is not significantly dilated. Aortic calcifications are
noted. There is no dissection. No significant aneurysm. Heart size
is normal. Coronary artery calcifications are noted. There is no
significant pericardial effusion.

Mediastinum/Nodes:

--No mediastinal or hilar lymphadenopathy.

--No axillary lymphadenopathy.

--No supraclavicular lymphadenopathy.

--Normal thyroid gland.

--The esophagus is unremarkable

Lungs/Pleura: There are asymmetric ground-glass airspace opacities
involving the right upper, right middle, and right lower lobes. The
left lower lobe is involved to a lesser degree. There is some right
lung volume loss. There is no pneumothorax. No large pleural
effusion. The trachea is unremarkable. There are mild emphysematous
changes bilaterally. There is a calcified pleural based plaque at
the right lung base.

Upper Abdomen: No acute abnormality. The enteric tube terminates
within the gastric body.

Musculoskeletal: No chest wall abnormality. No acute or significant
osseous findings.

Review of the MIP images confirms the above findings.
IMPRESSION: 1. Evaluation for pulmonary emboli is significantly limited as
detailed above. Given this limitation, no PE was identified.
2. Asymmetric ground-glass airspace opacities primarily involving
the right lung. Findings are consistent with the patient's reported
history of viral pneumonia. Asymmetric pulmonary edema could have a
similar appearance in the appropriate clinical setting.

Aortic Atherosclerosis (XN74N-HI8.8) and Emphysema (XN74N-N2E.F).
